# Patient Record
Sex: Female | Born: 1975 | Race: Black or African American | Hispanic: No | Marital: Single | Smoking: Current every day smoker
Health system: Southern US, Community
[De-identification: ages and names within clinical notes are randomized; demographics above are authoritative.]

## PROBLEM LIST (undated history)

## (undated) DIAGNOSIS — D649 Anemia, unspecified: Secondary | ICD-10-CM

## (undated) DIAGNOSIS — R011 Cardiac murmur, unspecified: Secondary | ICD-10-CM

## (undated) DIAGNOSIS — M1990 Unspecified osteoarthritis, unspecified site: Secondary | ICD-10-CM

## (undated) DIAGNOSIS — A539 Syphilis, unspecified: Secondary | ICD-10-CM

## (undated) DIAGNOSIS — I10 Essential (primary) hypertension: Secondary | ICD-10-CM

## (undated) DIAGNOSIS — A599 Trichomoniasis, unspecified: Secondary | ICD-10-CM

## (undated) DIAGNOSIS — K219 Gastro-esophageal reflux disease without esophagitis: Secondary | ICD-10-CM

## (undated) DIAGNOSIS — R002 Palpitations: Secondary | ICD-10-CM

## (undated) DIAGNOSIS — D219 Benign neoplasm of connective and other soft tissue, unspecified: Secondary | ICD-10-CM

## (undated) HISTORY — DX: Palpitations: R00.2

## (undated) HISTORY — PX: EYE SURGERY: SHX253

## (undated) HISTORY — PX: ABDOMINAL HYSTERECTOMY: SHX81

## (undated) HISTORY — PX: HAND SURGERY: SHX662

## (undated) HISTORY — DX: Essential (primary) hypertension: I10

## (undated) HISTORY — PX: GALLBLADDER SURGERY: SHX652

## (undated) HISTORY — PX: CHOLECYSTECTOMY: SHX55

---

## 1998-03-05 ENCOUNTER — Other Ambulatory Visit: Admission: RE | Admit: 1998-03-05 | Discharge: 1998-03-05 | Payer: Self-pay | Admitting: Obstetrics

## 1998-09-21 ENCOUNTER — Emergency Department (HOSPITAL_COMMUNITY): Admission: EM | Admit: 1998-09-21 | Discharge: 1998-09-21 | Payer: Self-pay | Admitting: Emergency Medicine

## 1999-04-20 ENCOUNTER — Other Ambulatory Visit: Admission: RE | Admit: 1999-04-20 | Discharge: 1999-04-20 | Payer: Self-pay | Admitting: Obstetrics

## 2000-04-18 ENCOUNTER — Other Ambulatory Visit: Admission: RE | Admit: 2000-04-18 | Discharge: 2000-04-18 | Payer: Self-pay | Admitting: Obstetrics

## 2000-07-06 ENCOUNTER — Other Ambulatory Visit: Admission: RE | Admit: 2000-07-06 | Discharge: 2000-07-06 | Payer: Self-pay | Admitting: Obstetrics

## 2000-07-06 ENCOUNTER — Encounter (INDEPENDENT_AMBULATORY_CARE_PROVIDER_SITE_OTHER): Payer: Self-pay

## 2000-09-09 ENCOUNTER — Emergency Department (HOSPITAL_COMMUNITY): Admission: EM | Admit: 2000-09-09 | Discharge: 2000-09-09 | Payer: Self-pay | Admitting: Emergency Medicine

## 2001-07-12 ENCOUNTER — Encounter (INDEPENDENT_AMBULATORY_CARE_PROVIDER_SITE_OTHER): Payer: Self-pay

## 2001-07-12 ENCOUNTER — Ambulatory Visit (HOSPITAL_COMMUNITY): Admission: RE | Admit: 2001-07-12 | Discharge: 2001-07-12 | Payer: Self-pay | Admitting: Obstetrics

## 2002-08-16 ENCOUNTER — Encounter (HOSPITAL_BASED_OUTPATIENT_CLINIC_OR_DEPARTMENT_OTHER): Payer: Self-pay | Admitting: General Surgery

## 2002-08-16 ENCOUNTER — Inpatient Hospital Stay (HOSPITAL_COMMUNITY): Admission: AD | Admit: 2002-08-16 | Discharge: 2002-08-19 | Payer: Self-pay | Admitting: General Surgery

## 2002-08-16 ENCOUNTER — Encounter (INDEPENDENT_AMBULATORY_CARE_PROVIDER_SITE_OTHER): Payer: Self-pay | Admitting: *Deleted

## 2002-08-17 ENCOUNTER — Encounter (HOSPITAL_BASED_OUTPATIENT_CLINIC_OR_DEPARTMENT_OTHER): Payer: Self-pay | Admitting: General Surgery

## 2003-01-01 ENCOUNTER — Emergency Department (HOSPITAL_COMMUNITY): Admission: AD | Admit: 2003-01-01 | Discharge: 2003-01-01 | Payer: Self-pay | Admitting: Family Medicine

## 2003-07-30 ENCOUNTER — Emergency Department (HOSPITAL_COMMUNITY): Admission: EM | Admit: 2003-07-30 | Discharge: 2003-07-30 | Payer: Self-pay | Admitting: Family Medicine

## 2003-12-17 ENCOUNTER — Inpatient Hospital Stay (HOSPITAL_COMMUNITY): Admission: AD | Admit: 2003-12-17 | Discharge: 2003-12-17 | Payer: Self-pay | Admitting: *Deleted

## 2004-01-20 ENCOUNTER — Inpatient Hospital Stay (HOSPITAL_COMMUNITY): Admission: AD | Admit: 2004-01-20 | Discharge: 2004-01-20 | Payer: Self-pay | Admitting: Obstetrics and Gynecology

## 2004-01-20 ENCOUNTER — Other Ambulatory Visit: Admission: RE | Admit: 2004-01-20 | Discharge: 2004-01-20 | Payer: Self-pay | Admitting: Obstetrics and Gynecology

## 2004-01-27 ENCOUNTER — Encounter (HOSPITAL_COMMUNITY): Admission: RE | Admit: 2004-01-27 | Discharge: 2004-02-26 | Payer: Self-pay | Admitting: Obstetrics and Gynecology

## 2004-03-17 ENCOUNTER — Encounter: Admission: RE | Admit: 2004-03-17 | Discharge: 2004-03-17 | Payer: Self-pay | Admitting: Nephrology

## 2004-08-18 ENCOUNTER — Inpatient Hospital Stay (HOSPITAL_COMMUNITY): Admission: AD | Admit: 2004-08-18 | Discharge: 2004-08-18 | Payer: Self-pay | Admitting: Obstetrics and Gynecology

## 2004-08-24 ENCOUNTER — Inpatient Hospital Stay (HOSPITAL_COMMUNITY): Admission: AD | Admit: 2004-08-24 | Discharge: 2004-08-26 | Payer: Self-pay | Admitting: Obstetrics and Gynecology

## 2004-09-01 ENCOUNTER — Inpatient Hospital Stay (HOSPITAL_COMMUNITY): Admission: EM | Admit: 2004-09-01 | Discharge: 2004-09-03 | Payer: Self-pay | Admitting: Emergency Medicine

## 2004-09-06 ENCOUNTER — Inpatient Hospital Stay (HOSPITAL_COMMUNITY): Admission: AD | Admit: 2004-09-06 | Discharge: 2004-09-12 | Payer: Self-pay | Admitting: Obstetrics and Gynecology

## 2004-10-09 ENCOUNTER — Inpatient Hospital Stay (HOSPITAL_COMMUNITY): Admission: AD | Admit: 2004-10-09 | Discharge: 2004-10-09 | Payer: Self-pay | Admitting: Obstetrics and Gynecology

## 2005-01-27 ENCOUNTER — Ambulatory Visit (HOSPITAL_COMMUNITY): Admission: RE | Admit: 2005-01-27 | Discharge: 2005-01-27 | Payer: Self-pay | Admitting: Family Medicine

## 2005-01-27 ENCOUNTER — Emergency Department (HOSPITAL_COMMUNITY): Admission: EM | Admit: 2005-01-27 | Discharge: 2005-01-27 | Payer: Self-pay | Admitting: Family Medicine

## 2005-12-14 ENCOUNTER — Ambulatory Visit (HOSPITAL_COMMUNITY): Admission: RE | Admit: 2005-12-14 | Discharge: 2005-12-15 | Payer: Self-pay | Admitting: Surgery

## 2006-01-19 ENCOUNTER — Encounter: Admission: RE | Admit: 2006-01-19 | Discharge: 2006-01-19 | Payer: Self-pay | Admitting: Surgery

## 2007-08-05 ENCOUNTER — Emergency Department (HOSPITAL_COMMUNITY): Admission: EM | Admit: 2007-08-05 | Discharge: 2007-08-06 | Payer: Self-pay | Admitting: Emergency Medicine

## 2007-08-08 ENCOUNTER — Emergency Department (HOSPITAL_COMMUNITY): Admission: EM | Admit: 2007-08-08 | Discharge: 2007-08-09 | Payer: Self-pay | Admitting: Emergency Medicine

## 2007-11-04 ENCOUNTER — Inpatient Hospital Stay (HOSPITAL_COMMUNITY): Admission: AD | Admit: 2007-11-04 | Discharge: 2007-11-04 | Payer: Self-pay | Admitting: Gynecology

## 2007-12-16 ENCOUNTER — Inpatient Hospital Stay (HOSPITAL_COMMUNITY): Admission: AD | Admit: 2007-12-16 | Discharge: 2007-12-16 | Payer: Self-pay | Admitting: Obstetrics & Gynecology

## 2008-01-27 ENCOUNTER — Emergency Department (HOSPITAL_COMMUNITY): Admission: EM | Admit: 2008-01-27 | Discharge: 2008-01-27 | Payer: Self-pay | Admitting: Emergency Medicine

## 2008-03-17 ENCOUNTER — Inpatient Hospital Stay (HOSPITAL_COMMUNITY): Admission: AD | Admit: 2008-03-17 | Discharge: 2008-03-17 | Payer: Self-pay | Admitting: Obstetrics & Gynecology

## 2008-08-01 ENCOUNTER — Inpatient Hospital Stay (HOSPITAL_COMMUNITY): Admission: AD | Admit: 2008-08-01 | Discharge: 2008-08-02 | Payer: Self-pay | Admitting: Obstetrics & Gynecology

## 2008-11-26 ENCOUNTER — Emergency Department (HOSPITAL_COMMUNITY): Admission: EM | Admit: 2008-11-26 | Discharge: 2008-11-26 | Payer: Self-pay | Admitting: Emergency Medicine

## 2009-04-02 ENCOUNTER — Inpatient Hospital Stay (HOSPITAL_COMMUNITY): Admission: AD | Admit: 2009-04-02 | Discharge: 2009-04-02 | Payer: Self-pay | Admitting: Obstetrics and Gynecology

## 2009-04-29 ENCOUNTER — Inpatient Hospital Stay (HOSPITAL_COMMUNITY): Admission: AD | Admit: 2009-04-29 | Discharge: 2009-04-29 | Payer: Self-pay | Admitting: Obstetrics & Gynecology

## 2009-10-06 ENCOUNTER — Ambulatory Visit: Payer: Self-pay | Admitting: Nurse Practitioner

## 2009-10-06 ENCOUNTER — Inpatient Hospital Stay (HOSPITAL_COMMUNITY): Admission: AD | Admit: 2009-10-06 | Discharge: 2009-10-06 | Payer: Self-pay | Admitting: Obstetrics

## 2009-12-03 ENCOUNTER — Emergency Department (HOSPITAL_COMMUNITY): Admission: EM | Admit: 2009-12-03 | Discharge: 2009-12-03 | Payer: Self-pay | Admitting: Emergency Medicine

## 2010-03-04 ENCOUNTER — Emergency Department (HOSPITAL_COMMUNITY): Admission: EM | Admit: 2010-03-04 | Discharge: 2009-04-08 | Payer: Self-pay | Admitting: Emergency Medicine

## 2010-03-28 DIAGNOSIS — D219 Benign neoplasm of connective and other soft tissue, unspecified: Secondary | ICD-10-CM

## 2010-03-28 HISTORY — DX: Benign neoplasm of connective and other soft tissue, unspecified: D21.9

## 2010-05-21 ENCOUNTER — Emergency Department (HOSPITAL_COMMUNITY)
Admission: EM | Admit: 2010-05-21 | Discharge: 2010-05-21 | Disposition: A | Discharge status: Home / Self Care | Payer: BC Managed Care – PPO | Attending: Emergency Medicine | Admitting: Emergency Medicine

## 2010-05-21 DIAGNOSIS — I10 Essential (primary) hypertension: Secondary | ICD-10-CM | POA: Insufficient documentation

## 2010-05-21 DIAGNOSIS — M5430 Sciatica, unspecified side: Secondary | ICD-10-CM | POA: Insufficient documentation

## 2010-05-21 DIAGNOSIS — M545 Low back pain, unspecified: Secondary | ICD-10-CM | POA: Insufficient documentation

## 2010-06-12 LAB — URINALYSIS, ROUTINE W REFLEX MICROSCOPIC
Bilirubin Urine: NEGATIVE
Glucose, UA: NEGATIVE mg/dL
Ketones, ur: NEGATIVE mg/dL
Leukocytes, UA: NEGATIVE
Nitrite: NEGATIVE
Protein, ur: NEGATIVE mg/dL
Specific Gravity, Urine: 1.025 (ref 1.005–1.030)
Urobilinogen, UA: 0.2 mg/dL (ref 0.0–1.0)
pH: 5.5 (ref 5.0–8.0)

## 2010-06-12 LAB — CBC
HCT: 36.6 % (ref 36.0–46.0)
Hemoglobin: 12.3 g/dL (ref 12.0–15.0)
MCHC: 33.6 g/dL (ref 30.0–36.0)
MCV: 93.9 fL (ref 78.0–100.0)
Platelets: 276 10*3/uL (ref 150–400)
RBC: 3.9 MIL/uL (ref 3.87–5.11)
RDW: 12.5 % (ref 11.5–15.5)
WBC: 8.4 10*3/uL (ref 4.0–10.5)

## 2010-06-12 LAB — URINE MICROSCOPIC-ADD ON: WBC, UA: NONE SEEN WBC/hpf (ref <3)

## 2010-06-12 LAB — POCT PREGNANCY, URINE: Preg Test, Ur: NEGATIVE

## 2010-06-13 LAB — URINALYSIS, ROUTINE W REFLEX MICROSCOPIC
Ketones, ur: NEGATIVE mg/dL
Nitrite: NEGATIVE
Specific Gravity, Urine: 1.01 (ref 1.005–1.030)
Urobilinogen, UA: 0.2 mg/dL (ref 0.0–1.0)
pH: 6.5 (ref 5.0–8.0)

## 2010-06-13 LAB — WET PREP, GENITAL: Trich, Wet Prep: NONE SEEN

## 2010-06-13 LAB — URINE MICROSCOPIC-ADD ON

## 2010-06-13 LAB — GC/CHLAMYDIA PROBE AMP, GENITAL: Chlamydia, DNA Probe: NEGATIVE

## 2010-06-16 LAB — CBC
HCT: 35.6 % — ABNORMAL LOW (ref 36.0–46.0)
Hemoglobin: 12 g/dL (ref 12.0–15.0)
MCV: 93 fL (ref 78.0–100.0)
RBC: 3.83 MIL/uL — ABNORMAL LOW (ref 3.87–5.11)
WBC: 8.3 10*3/uL (ref 4.0–10.5)

## 2010-06-16 LAB — URINALYSIS, ROUTINE W REFLEX MICROSCOPIC
Bilirubin Urine: NEGATIVE
Ketones, ur: NEGATIVE mg/dL
Nitrite: NEGATIVE
Protein, ur: NEGATIVE mg/dL
Specific Gravity, Urine: 1.01 (ref 1.005–1.030)
Urobilinogen, UA: 0.2 mg/dL (ref 0.0–1.0)

## 2010-06-16 LAB — URINE CULTURE: Colony Count: 10000

## 2010-06-16 LAB — URINE MICROSCOPIC-ADD ON

## 2010-06-16 LAB — POCT PREGNANCY, URINE: Preg Test, Ur: NEGATIVE

## 2010-07-06 LAB — GC/CHLAMYDIA PROBE AMP, GENITAL
Chlamydia, DNA Probe: NEGATIVE
GC Probe Amp, Genital: NEGATIVE

## 2010-07-06 LAB — HERPES SIMPLEX VIRUS CULTURE: Culture: DETECTED

## 2010-08-13 NOTE — Discharge Summary (Signed)
NAMESHENICKA, SUNDERLIN              ACCOUNT NO.:  [id number]   MEDICAL RECORD NO.:  [id number]          PATIENT TYPE:  INP   LOCATION:  9372                          FACILITY:  [facility]   PHYSICIAN:  Naima A. Dillard, M.D. DATE OF BIRTH:  02-01-1976   DATE OF ADMISSION:  09/06/2004  DATE OF DISCHARGE:  09/12/2004                                 DISCHARGE SUMMARY   ADMITTING DIAGNOSIS:  13 days postpartum from spontaneous vaginal delivery,  severe preeclampsia, history of subarachnoid hemorrhage noted on September 01, 2004 and September 02, 2004 with CT scan.   DISCHARGE DIAGNOSIS:  19 days status post spontaneous vaginal delivery,  severe preeclampsia, labile blood pressure, resolving subarachnoid  hemorrhage.   HISTORY OF PRESENT ILLNESS:  Ms. Ouellet is a 35 year old gravida 2, para 2  who presented 13 days status post spontaneous vaginal delivery. She  presented following elevation of blood pressure at home by evaluation of the  [employer] nurse of 160/100. She was then seen at the office of [facility] that  day with extremely elevated blood pressures of 180-190 over 100. She had  3.0+ protein on the voided catheter specimen and on 24-hour urine completed  September 07, 2004, total urine protein for 24 hours was noted to be 2196 grams.   HOSPITAL COURSE:  The patient was admitted to [facility]. Magnesium sulfate 4 grams loading dose and 2 grams per hour was  begun. The patient continued neurology follow-up which was begun at [facility] during admission June 7 and June 8. The patient also is to be  followed by nephrology. Her blood pressures remained labile throughout her  admission. Magnesium sulfate was utilized for 48 hours. The patient did not  have good diuresis and was begun on a diuretic as well as blood pressure  medicine. Her blood pressures yesterday on June 17 were 150's over 90's to  100's. Her Procardia was increased at that time and today her 19th  postpartum  day, her blood pressures remained stable 120's-130's over 70's to  90's. The patient is now taking Procardia XL 60 milligrams, K-Dur 40 mEq,  and HCTZ 50 mg twice a day. She was alert and oriented. Her lungs are clear.  Heart regular rate and rhythm. Her edema has significantly decreased. She is  no longer having any headache, visual changes or epigastric pain. In light  of the fact that her vital signs remained stable, her blood pressure has  normalized and she has no further headache symptoms, she is to be discharged  today in stable condition. She will follow up with neurology and nephrology  this week and also at the office of [facility]. She will call for any PIH  symptoms, any headache, visual changes or epigastric pain or any further  problems or concerns.       SDM/MEDQ  D:  09/12/2004  T:  09/12/2004  Job:  [id number]

## 2010-08-13 NOTE — Op Note (Signed)
[name], [name]                        ACCOUNT NO.:  [id number]   MEDICAL RECORD NO.:  [id number]                   PATIENT TYPE:  INP   LOCATION:  5703                                 FACILITY:  [facility]   PHYSICIAN:  Leonie Man, M.D.                DATE OF BIRTH:  19-May-1975   DATE OF PROCEDURE:  08/17/2002  DATE OF DISCHARGE:                                 OPERATIVE REPORT   PREOPERATIVE DIAGNOSIS:  Acute cholecystitis.   POSTOPERATIVE DIAGNOSIS:  Acute cholecystitis.   PROCEDURE:  Laparoscopic cholecystectomy.   SURGEON:  Leonie Man, M.D.   ASSISTANT:  Sharlet Salina T. Hoxworth, M.D.   ANESTHESIA:  General.   FINDINGS:  This patient is a 35 year old female with onset of right upper  quadrant and epigastric pain penetrating to the back and associated with  nausea.  She was evaluated and noted to have cholelithiasis on ultrasound.  The patient was then subsequently admitted to the hospital, started on  antibiotics and hydration, and scheduled for surgery.  Surgical findings  show a dual gallbladder with a dual cystic ducts effusing into a single duct  at the common duct, cystic duct, junction.  She had multiple stones in both  gallbladders.   DESCRIPTION OF PROCEDURE:  Following the induction of satisfactory general  anesthesia with the patient positioned supinely, the abdomen was routinely  prepped and draped to be included in a sterile operative field.  Open  laparoscopy was created through a supraumbilical incision and for the  insertion of a Hasson cannula, insufflation of the peritoneal cavity to 14  to 15 mmHg pressure using carbon dioxide.  The camera was inserted and  visual exploration of the abdomen showed the findings as noted above; a dual  gallbladder ending in a fused cystic duct. The liver edges were sharp and  liver surfaces were smooth. The anterior gastric wall and duodenal slip  appeared to be normal.  None of the small or large intestines  viewed  appeared to be abnormal.  Her pelvic organs were not visualized.   Under direct vision, epigastric, and lateral ports were placed.  The  gallbladders were grasped and retracted cephalad with dissection carried  down in the region of the ampulla of the larger of the two gallbladders.  Dissection was carried around the cystic duct which appeared to be normal in  size. The cystic artery all of which lead up to the gallbladder walls,  appeared to be dual strands of arterial vessels.  These were doubly clipped  and transected.  The cystic duct was clipped proximally and opened.  Upon  opening, two lumens were noted.  The cystic duct catheter was passed into  the abdomen through a 14 gauge Angiocath and the catheter was placed in the  cystic duct and secured. A cystic duct cholangiogram was carried out showing  free flow of contrast into the duodenum with normal tapering of the  distal  common bile duct.  No filling defects were noted within the extrahepatic  biliary system. The cystic duct catheter was removed and the cystic duct was  doubly clipped and then transected.  Upon transecting, dual lumens were  noted and identified.  On both the distal and proximal ends.  The distal end  was triply clipped to assure closure. Both gallbladders were fused with a  septum and they were both dissected free from the liver bed. There were two  separate gallbladder fossae with the septum of the gallbladder fossae being  taken as the dissection proceeded toward the fundus of the gallbladder.  The  gallbladders were removed from the liver edge as a single specimen.  Hemostasis was assured with electrocautery. The right upper quadrant was  thoroughly irrigated with normal saline. The gallbladders were placed in a  pouch and retrieved through the umbilicus.  All areas of dissection were  again checked for hemostasis and noted to be dry. There was no evidence of  bile leak. A 19 [place] Blake drain was  passed into the abdomen and placed  under the liver for drainage.  The trocars were then removed under direct  vision.  Needle, sponge, and instrument count correct.  The abdominal wounds  closed in layers as follows. The umbilical wound in two layers with 0 Vicryl  and 4-0 Monocryl. Epigastric wound and the flank wound closed with 4-0  Monocryl. The drain which was passed through one of the port sites was  secured to the skin with a 3-0 nylon suture.  The sterile dressings were  then placed on the wounds and the anesthetic reversed. The patient was  removed from the operating room to the recovery room in stable condition.  She tolerated the procedure well.                                               Leonie Man, M.D.    PB/MEDQ  D:  08/17/2002  T:  08/17/2002  Job:  [id number]

## 2010-08-13 NOTE — Op Note (Signed)
[facility]  Patient:    [name], TRIMBLE Visit Number: [id number] MRN: [id number]          Service Type: DSU Location: [facility] Attending Physician:  Venita Sheffield Dictated by:   Kathreen Cosier, M.D. Proc. Date: 07/12/01 Admit Date:  07/12/2001                             Operative Report  ANESTHESIA:                   General.  PROCEDURE:                    Conization of the cervix.  DIAGNOSES:                    1. Severe dysplasia.                               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  DESCRIPTION OF PROCEDURE:     Under general anesthesia the patient in the lithotomy position, the perineum and vagina were prepped and draped.  The bladder emptied through the straight catheter.  Bimanual exam revealed uterus to be normal size.  Weighted speculum then placed in the vagina.  The cervix was grasped with an Allis clamp at 3 oclock, and high upon the lateral aspect of the cervix at 3 oclock a hemostatic suture was placed with #1 chromic. This was then repeated at 9 oclock (so sutures were at 3 and 9 oclock).  The cervix was stained with _____________ solution.  Colonized cone was done in the usual manner.  Hemostasis was achieved with U sutures into the cervix at 12 and 6 oclock.  The endometrial cavity was sounded, the cervix was patent. Hemostasis satisfactory.  The patient taken to recovery room in good condition. Dictated by:   Kathreen Cosier, M.D. Attending Physician:  Venita Sheffield DD:  07/12/01 TD:  07/13/01 Job: [id number]

## 2010-08-13 NOTE — Op Note (Signed)
[name], [name]              ACCOUNT NO.:  [id number]   MEDICAL RECORD NO.:  [id number]          PATIENT TYPE:  AMB   LOCATION:  DAY                          FACILITY:  [facility]   PHYSICIAN:  Thomas A. Cornett, M.D.DATE OF BIRTH:  09/08/1975   DATE OF PROCEDURE:  12/14/2005  DATE OF DISCHARGE:                                 OPERATIVE REPORT   PREOPERATIVE DIAGNOSIS:  Ventral hernia.   POSTOPERATIVE DIAGNOSIS:  4 cm x 4 cm ventral hernia.   PROCEDURE:  Laparoscopic ventral hernia repair with mesh.   SURGEON:  Maisie Fus A. Cornett, M.D.   ASSISTANT:  None.   ANESTHESIA:  General endotracheal anesthesia with 0.25% Sensorcaine local.   ESTIMATED BLOOD LOSS:  10 cc.   DRAINS:  None.   INDICATIONS FOR PROCEDURE:  The patient 35 year old female has had about a 2  to 3-cm periumbilical hernia.  I saw her a few months ago, and she comes in  today to have this repaired electively.  The hernia has been getting larger  and causing her discomfort.  I discussed the procedure with her as well as  potential complications.  She understood and agreed to proceed at this point  in time.  The risks of bleeding, infection, bowel injury, hernia recurrence,  and bowel obstruction were all discussed preoperatively with the patient,  and she voiced her understanding.  This was done in the holding room as well  as in the office.   DESCRIPTION OF PROCEDURE:  The patient was brought to the operating room and  placed supine.  After induction of general anesthesia, a Foley catheter and  nasogastric tubes were placed.  The abdomen was then prepped and draped in  sterile fashion.  Ioban adhesive drape was used as well.   A 10-mm Optiview was used, and a small incision was made in the patient's  left upper quadrant.  We used the Optiview under direct vision to enter the  abdominal cavity without difficulty.  Pneumoperitoneum was then established,  and the laparoscope was placed.  I examined the colon and  small bowel in the  region where the Optiview was placed and saw no evidence of injury.  Two 5-  mm ports were then placed, one in the patient's right lower quadrant and the  second in the left upper quadrant under direct vision.  On inspection of the  abdominal wall just above the umbilicus was what appeared to be a 4 x 4-cm  hernia defect.  I measured this using one of the instruments with its jaws  wide open, and this appeared to be very accurate.  The falciform ligament  was taken down away from this to further expose the abdominal wall with the  harmonic scalpel without difficulty.  After measuring the defect, we then  measured from the outside using a spinal needle circumferentially and marked  this.  We went for about 4 cm of overlap, so we used a piece of Parietex and  cut it in a circular fashion with diameters of roughly 9  x 8 cm for  adequate overlap.  Four sutures of #  1 Novofil were placed in the Parietex.  It was then placed in saline and then introduced through the 10-mm port into  the abdominal cavity.  It was laid out flat in the abdominal cavity.  The  suture passer was used, and all four sutures were grabbed at those four  quadrants, lifted and pulled up flush with the fascia.  This was then tied  down.  Once these sutures were tied down, the mesh had reasonable tension  without too much tension.  A tacking device was used to tack the mesh  circumferentially so it would lay flush with the abdominal wall with the  smooth side down.  There was very little pleating of the mesh, but this  occurred inferiorly where the inferior suture was.  I was able to use a  tacker to smooth this out and help the mesh lay flat to cover the defect  adequately.  At this point in time, I inspected the bowel a second time  after releasing the CO2 and reinflating the abdominal cavity and again saw  no evidence of bowel injury or solid organ injury.  Hemostasis was  excellent.  At this point in  time, I removed 11-mm port and closed this with  a suture passer and a  0 Vicryl.  The CO2 was subsequently released slowly,  and the mesh laid very nicely.  Unfortunately, she had very little omentum  to lay below this, but this seemed to lay quite nicely.  At this point in  time, I removed the other three ports without difficulty and saw no signs of  port site bleeding.  All skin incisions including the stab wounds for the  transabdominal sutures were closed with 4-0 Monocryl.  Steri-Strips and dry  dressings were applied.  All final counts were correct.   The patient was awakened and taken to recovery in satisfactory condition.      Thomas A. Cornett, M.D.  Electronically Signed     TAC/MEDQ  D:  12/14/2005  T:  12/15/2005  Job:  [id number]   cc:   Dr. Christell Constant  __________ [facility]

## 2010-08-13 NOTE — H&P (Signed)
NAMEAMBERROSE, FRIEBEL              ACCOUNT NO.:  [id number]   MEDICAL RECORD NO.:  [id number]          PATIENT TYPE:  INP   LOCATION:  9171                          FACILITY:  [facility]   PHYSICIAN:  Janine Limbo, M.D.DATE OF BIRTH:  July 05, 1975   DATE OF ADMISSION:  08/24/2004  DATE OF DISCHARGE:                                HISTORY & PHYSICAL   HISTORY OF PRESENT ILLNESS:  Ms. Lasure is a 35 year old gravida 2, para 1-0-  0-1 at 41-4/7 weeks who presented without calling with uterine contractions  every five minutes at 3:30 a.m.  She denies leaking or bleeding.  She  reports positive fetal movement.  The pregnancy has been remarkable for:  1.  History of post partum depression.  2.  History of positive Syphilis, which  was treated in October.  3.  Smoker.  4.  Rubella negative.  5.  Desires  tubal sterilization.  The papers were signed April 29, 2004.  6.  History  of sporadic proteinuria.  She had a normal workup at the nephrologist.   PRENATAL LABORATORIES:  Blood type is A positive.  Rh antibody negative.  VDRL positive in September with a titer of 16.  In September after treatment  her titer was down to 1.8.  Rubella titer is nonimmune.  Hepatitis B surface  antigen negative.  RPR was 1:2 at 27 weeks.  Pap was normal.  GC and  Chlamydia cultures were negative in October and at 36 weeks.  Hemoglobin at  our practice was 12.8.  It was 9.9. at 26 weeks.  EDC of Aug 13, 2004 was  established by the last menstrual period and was in agreement with the  ultrasound at approximately 5 and 18 weeks.   HISTORY OF PRESENT PREGNANCY:  The patient entered care at approximately 10  weeks.  She had had a positive Syphilis titer on December 21, 2003.  She  was treated subsequently once she began her care.  She then had a repeat  original titer in October that was 1:16.  Her followup titer in September  was 1:8 and then was 1:4 on December 31, 2004.  She had some proteinuria at 13  weeks.  A  urine culture was negative.  A 24 hour urine was done secondary to  persistent proteinuria.  The patient was referred to the nephrologist.  Her  24 hour urine protein was 175.  Creatinine was elevated at 4.2, creatinine  clearance was decreased at 14.  She had another ultrasound at 19 weeks for  normal growth and development.  Her nephrology appointment showed no  significant disease.  The patient also signed tubal papers on May 12, 2004.  Her RPR titer at 26 weeks was 1:2.  A consult with the health  department revealed no further treatment was needed.  She was diagnosed with  gastroesophageal reflux disease.  After approximately 34 weeks the patient  had no further proteinuria.  Blood pressures were stable.  A group B  Streptococcus culture was negative.  GC and Chlamydia cultures were  negative.   OBSTETRICAL HISTORY:  The patient  in January 1998 had a vaginal birth of  female infant, weight 6 pounds 10 ounces, at 40 weeks.  She was in labor 12  hours.  She had no anesthesia.  The baby did have some meconium and had a  cord around the neck.  She did have some post partum depression with her  first pregnancy but had no treatment.   MEDICAL HISTORY:  1. She had an abnormal Pap smear in 2003 and had a colposcopy.  2. She was diagnosed with Syphilis in October and then was treated      subsequently.  3. She reports the usual childhood illnesses.  4. She had a bladder infection years ago.  5. She is a smoker.  6. She broke her thumb as a child.  7. She had hernia surgery 10 years ago.  8. Her gallbladder was removed at 35 years old.  9. Her only other hospitalization was for childbirth.     FAMILY HISTORY:  Her maternal grandfather was insulin-dependent diabetic now  deceased.  A maternal aunt was on oral medication.  Her maternal grandfather  had dialysis.  Genetic history is remarkable for the father of baby having  twins running in the family.   SOCIAL HISTORY:  The patient  is Tree surgeon.  She is single. The father  of the baby is deceased from a robbery early in the patient's pregnancy.  The patient has a high school education.  She is employed as Advertising copywriter.  Her mother is her support person.  She denies any alcohol or drug use during  this pregnancy.  She has been a smoker.   ALLERGIES:  THE PATIENT HAS NO KNOWN MEDICATION ALLERGIES.   PHYSICAL EXAMINATION:  VITAL SIGNS:  Stable.  The patient is afebrile.  HEENT:  Within normal limits.  LUNGS:  Breath sounds are clear.  HEART:  Regular rate and rhythm without murmur.  BREASTS:  Soft and nontender.  ABDOMEN:  Fundal height is approximately 38 cm.  Estimated fetal weight is 6-  7 pounds.  Electronic fetal monitor reveals reassuring and reactive fetal  heart rate tracing.  PELVIC:  Originally was 1 cm, 90% vertex, and a -1 station.  There appears  to be some scar tissue around the cervical os.  The patient ambulated one  hour. The cervix was then 1-2, 90%  vertex, and at a -1 to a 0 station with  a bulging bag of water.  EXTREMITIES:  Reflexes are 2+ without clonus.  There is a trace edema noted.   IMPRESSION:  1. Intrauterine pregnancy at 41-4/7 weeks.  2. Early labor.  3. History of positive Syphilis in October, which was treated.  4. Desires tubal sterilization with papers signed on May 12, 2004 and      on the chart.  5. Group B Streptococcus negative.     PLAN:  1. Admit to birthing suite for consult with Dr. Marline Backbone as the      attending physician.  2. Routine certified nurse midwife orders.  3. Planned pain medication p.r.n.        VLL/MEDQ  D:  08/24/2004  T:  08/24/2004  Job:  [id number]

## 2010-08-13 NOTE — Consult Note (Signed)
[name], [name]              ACCOUNT NO.:  [id number]   MEDICAL RECORD NO.:  [id number]          PATIENT TYPE:  EMS   LOCATION:  MINO                         FACILITY:  [facility]   PHYSICIAN:  Sanjeev K. Deveshwar, M.D.DATE OF BIRTH:  27-Jan-1976   DATE OF CONSULTATION:  09/01/2004  DATE OF DISCHARGE:                                   CONSULTATION   CHIEF COMPLAINT:  Headache.   HISTORY OF PRESENT ILLNESS:  This is a pleasant 35 year old African-American  female who presented to [facility] today for  evaluation of a headache which started yesterday.  An MRI was ordered.  I do  not have the detailed report, but apparently it was consistent with a small  subarachnoid hemorrhage.  Dr. Wynetta Emery asked Dr. Corliss Skains to perform a cerebral  angiogram to further evaluate these findings.  The angiogram will be  performed as soon as possible today.   PAST MEDICAL HISTORY:  The patient has been healthy.  She recently gave  birth to a son last Tuesday.  She has one other child at home.  She is  status post cholecystectomy approximately 3 years ago performed by Dr.  Lurene Shadow.  She denies a history of hypertension, however her blood pressure is  quite high in the emergency room today.   ALLERGIES:  NO KNOWN DRUG ALLERGIES.   CURRENT MEDICATIONS:  None.   REVIEW OF SYSTEMS:  Is completely negative except for the following.  The  patient has had a headache since yesterday.  She had reflux symptoms when  she was pregnant.  The remainder of the review of systems is negative.   SOCIAL HISTORY:  The patient is single, she lives in [place], she has 2  children.  She smokes 6 cigarettes per day and has done so for approximately  6-10 years.  She works in Stage manager at [employer].   FAMILY HISTORY:  Both parents are living, in their 68s, and have no  significant medical illnesses.   LABORATORY DATA:  The final MRI report is pending; however, it was  consistent with a  subarachnoid hemorrhage.  A prothrombin time INR is 0.8, a  PTT is 26, platelets are 387,000, hemoglobin 11.9, hematocrit 35, BUN 10,  creatinine 0.7, potassium is low at 3.3.   PHYSICAL EXAM:  Reveals a 35 year old African-American female who is tearful  at times with a headache.  VITAL SIGNS:  Blood pressure 191/94, pulse 53.  HEENT:  Is unremarkable.  NECK:  Reveals no bruits, no jugular venous distention.  HEART:  Reveals a regular rate and rhythm with a grade 2/6 systolic murmur.  LUNGS:  Clear.  EXTREMITIES:  Reveal pulses to be intact, there is no significant edema.  Skin is warm and dry.  NEUROLOGICAL EXAM:  Mental Status.  The patient is alert and oriented, she  follows commands, she answers questions appropriately.  Cranial nerves II-  XII are grossly intact.  Extraocular movements are intact.  Tongue is  midline.  There is no facial asymmetry noted.  Sensation is intact to light  touch.  Motor strength was 5/5 throughout.  Cerebellar testing is intact.   IMPRESSION:  1.  Headache since yesterday, with MRI today, preliminary report consistent      with small subarachnoid hemorrhage, final report pending.  2.  Hypertension.  3.  Recent childbirth.  4.  Status post cholecystectomy.  5.  History of tobacco use.  6.  Hypokalemia.   PLAN:  The patient will undergo a cerebral angiogram today to be performed  by Dr. Corliss Skains for further evaluation of the above-noted symptoms and her  abnormal MRI.      DR/MEDQ  D:  09/01/2004  T:  09/01/2004  Job:  [id number]   cc:   Donalee Citrin, M.D.  [address]  [place]  [place] [address]  Fax: [phone]   Nigel Bridgeman, M.D.  [place]

## 2010-08-13 NOTE — Discharge Summary (Signed)
   NAMEKEIARAH, [name]                        ACCOUNT NO.:  [id number]   MEDICAL RECORD NO.:  [id number]                   PATIENT TYPE:  INP   LOCATION:  5703                                 FACILITY:  [facility]   PHYSICIAN:  Leonie Man, M.D.                DATE OF BIRTH:  05-08-1975   DATE OF ADMISSION:  08/16/2002  DATE OF DISCHARGE:  08/19/2002                                 DISCHARGE SUMMARY   ADMISSION DIAGNOSIS:  Acute cholecystitis.   DISCHARGE DIAGNOSIS:  Acute cholecystitis.   PROCEDURES:  Laparoscopic cholecystectomy with interoperative cholangiogram  and drainage. No complications.   CONDITION ON DISCHARGE:  Improved.   HISTORY OF PRESENT ILLNESS:  [name] is a 35 year old woman presenting  with onset of right upper quadrant abdominal pain with severe  worsening  over the ensuing days with pain radiating to her back. She had no nausea or  emesis. She was seen by her gynecologist who on ultrasound noted  cholelithiasis.   HOSPITAL COURSE:  The patient was admitted to the hospital on Aug 16, 2002,  and started on antibiotics. 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 She underwent cholecystectomy of both  her gallbladders with a drain left in place.   Her postoperative course has been benign with normal resumption of diet and  activity. On Aug 19, 2002, she is being discharged to follow up in my office  in 2 weeks.   DISCHARGE MEDICATIONS:  Vicodin 1 to 2 tablets q.4h. p.r.n. pain.   DISCHARGE INSTRUCTIONS:  Activity as tolerated. Diet is unrestricted.                                               Leonie Man, M.D.    PB/MEDQ  D:  09/06/2002  T:  09/07/2002  Job:  [id number]

## 2010-08-13 NOTE — Consult Note (Signed)
NAMELARSEN, ZETTEL              ACCOUNT NO.:  [id number]   MEDICAL RECORD NO.:  [id number]          PATIENT TYPE:  EMS   LOCATION:  MINO                         FACILITY:  [facility]   PHYSICIAN:  Marlan Palau, M.D.  DATE OF BIRTH:  1976-02-22   DATE OF CONSULTATION:  09/01/2004  DATE OF DISCHARGE:                                   CONSULTATION   HISTORY OF PRESENT ILLNESS:  [name] is a 35 year old right-handed  black female born on 08-03-1975, with a history of recent delivery  about a week ago and history of syphilis that was treated in the fall of  2005 with reduction of titer from 1-16 to 1-2 prior to delivery. This  patient presents with headache that began 24 hours prior to this admission.  The patient does not report neck stiffness. Denies visual field change,  numbness or weakness in the arms or legs, nausea, vomiting, or gait  disturbance. The patient has not had confusion or blackout episodes.  The  patient underwent a CT scan of the head that showed subarachnoid blood  around the right parietal, occipital, and temporal lobes, and a venous  infarct is entertained. Neurosurgery (Dr. Donalee Citrin) and neurology was asked  to see this patient for further evaluation.   PAST MEDICAL HISTORY:  1.  New onset headache with subarachnoid blood, possible venous infarct as      above.  2.  Umbilical hernia repair in the past  3.  History of retinal detachment surgery in the past.  4.  Gallbladder surgery.  5.  History of syphilis that was treated.  6.  History of proteinuria, seen and evaluated by hematology in the past.  7.  History of postpartum depression. The patient had been taking some      Motrin prior to admission.   Smokes six cigarettes daily. Does not drink alcohol. Denies the use of other  drugs such as cocaine or marijuana. The patient has no known allergies.   SOCIAL HISTORY:  [place],  is single, has two  children, works as a Advertising copywriter. Children are alive and  well.   FAMILY MEDICAL HISTORY:  Notable that the patient has a maternal grandfather  with diabetes and maternal aunt with diabetes on oral medications. Maternal  grandfather has end-stage renal disease on hemodialysis. There is no family  history of clotting abnormalities or individuals with retinal detachments.   REVIEW OF SYSTEMS:  Notable for no recent fevers, chills. The patient does  note headache as above. Does not have any problems with shortness of breath,  chest pain, nausea, vomiting, trouble controlling the bowel or bladder, gait  disturbance, blackout episodes, or seizures.   PHYSICAL EXAMINATION:  VITAL SIGNS: Blood pressure currently 198/89, heart  rate 50, respiratory rate 18, temperature afebrile.  GENERAL: This patient is minimally obese black female who is alert and  cooperative at the time of examination.  HEENT: Head is atraumatic. Eyes reveal pupils equal, round, and reactive to  light. Disks are flat bilaterally.  NECK: Supple with no carotid bruits  noted.  RESPIRATORY:  Clear.  CARDIOVASCULAR: Regular rate and rhythm with no obvious murmurs or rubs  noted.  EXTREMITIES: Without significant edema.  ABDOMEN: Positive bowel sounds. No organomegaly or tenderness noted.  EXTREMITIES: Good strength in all four extremities.  NEUROLOGIC: Cranial nerves as above. Facial symmetry is present. The patient  has good sensation to facial pinprick, soft touch bilaterally. She has good  full extraocular movements. Visual fields are full to double simultaneous  stimulation and speech is well enunciated and nonaphasic. Good symmetrical  motor tone noted throughout. Sensory testing is intact to pinprick, soft  touch, and vibratory sensation throughout. The patient has good finger-nose-  finger and toe-to-finger bilaterally. No drift is seen. Deep tendon reflexes  are symmetric and normal. Toes are definitely downgoing  bilaterally.   CT of the head is as above. Bloodwork studies show sodium 143, potassium  3.3, chloride 110, CO2 24, glucose 97, BUN 10, creatinine 0.7, potassium  9.3. White count 11.5, hemoglobin 11.9, hematocrit 35.0, MCV 90.6, platelet  count 387,000. INR 0.8.   Chest x-ray and EKG are pending.   IMPRESSION:  1.  New onset headache with subarachnoid hemorrhage, right parieto-occipital      temporal lobes. Rule out venous thrombosis.  2.  History of syphilis, has been treated.  3.  History of retinal detachment in the past.   The patient is postpartum which puts her at high risk for venous size  thrombosis, but also has a history of retinal detachment and for this reason  do need to consider the possibility of homocystinuria. Also need to rule out  other hypercoagulable state such as factor 5 Leiden mutation. The patient  will require further evaluation of the above problem.   PLAN:  1.  MRI of the brain.  2.  MRV and MRA.  3.  Consider heparin therapy if a sinus thrombosis is noted.  4.  Could potentially consider a catheterization of venous sinus with      intersinus TPA.  5.  The patient's clinical condition at this point is not dire. Will      consider hypercoagulable state as above. Check urine drug screen.   I will follow the patient's clinical course while in-house.       CKW/MEDQ  D:  09/01/2004  T:  09/01/2004  Job:  [id number]   cc:   Donalee Citrin, M.D.  [address]  [place]  [place] [address]  Fax: [phone]

## 2010-08-13 NOTE — H&P (Signed)
[name], [name]              ACCOUNT NO.:  [id number]   MEDICAL RECORD NO.:  [id number]          PATIENT TYPE:  INP   LOCATION:  9372                          FACILITY:  [facility]   PHYSICIAN:  [name], M.D. DATE OF BIRTH:  October 28, 1975   DATE OF ADMISSION:  09/06/2004  DATE OF DISCHARGE:                                HISTORY & PHYSICAL   HISTORY OF PRESENT ILLNESS:  [name] is a 35 year old, gravida 2, para 2-  0-0-2, at 13 days status post spontaneous vaginal delivery, who presented  after evaluation by the [facility] nurse and was noted to have elevated  blood pressures in the 160-100 range.  She was seen at the office, and was  also noted to have blood pressures in the 140s-160s/100-110.  She also had  3+ proteinuria on a voided specimen.  At the time of her office visit, the  patient also reported a recent admission at [facility] ICU on September 01, 2004 to September 03, 2004 with severe headache, hypertension, and a diagnosis  of subarachnoid hemorrhage by CT.  The patient had had blood pressures of  170s/90s during that hospitalization.  She was treated with isolated doses  of Labetalol.  She was discharged on September 03, 2004 with a follow up  appointment made in 2 weeks with Dr. Wynetta Benson.  No medications were prescribed  for her home use.   While she was in the hospital, she did not have a urine sample examined, but  did have a CBC that showed within normal limits, and a PT PTT within normal  limits.  The patient reports her headache was improved over the last 1-2  days.  She denied visual symptoms or epigastric pain.  She had an angiogram  on September 01, 2004 with no significant findings.   History had been remarkable for -  1.  History of sporadic proteinuria in early pregnancy.  She had a normal 24-      hour urine protein in December with a creatinine of 4.2.  She was      referred to the nephrologist at that time.  Follow up examinations were      normal, and there was  some question as to the accuracy of these results.      Proteinuria resolved until the last 2 prenatal visits at 39 and 40      weeks.  She had no history of hypertension in pregnancy, labor, or      postpartum.  2.  History of syphilis in the first trimester, which was treated.  Her last      titer was 1:4 in the hospital.  3.  History of a detached retina with surgery in the past.  4.  Smoker.   PAST MEDICAL HISTORY:  1.  Subarachnoid bleed noted on September 01, 2004.  2.  Umbilical hernia repair in the past.  3.  History of retinal detachment surgery in the past.  4.  Gallbladder surgery.  5.  History of syphilis.  6.  History of proteinuria, which was seen and evaluated by hematology  in      the past.  7.  History of postpartum depression, but was having no issues this      pregnancy.  8.  She had an abnormal Pap smear in the past and a colposcopy.  9.  She reports the usual childhood illnesses.  10. She had a bladder infection years ago.  11. She is a smoker.  12. She broke her thumb as a child.   OBSTETRICAL HISTORY:  1.  In 1998, she had a vaginal birth of a female infant, weight 6 pounds, 10      ounces, at 40 weeks.  She was in labor 12 hours.  She did have meconium      staining and had a cord around the neck.  There was some postpartum      depression, but that required no treatment.  2.  She had another vaginal delivery on Aug 24, 2004, attended by [name], certified nurse midwife.  She had no complications during that      labor and birth.  She had a viable female.  She is bottle feeding at      present.   FAMILY HISTORY:  Her maternal grandfather was an insulin-dependent diabetic,  now deceased.  Her maternal aunt was on oral medication for diabetes.  Her  maternal grandfather was on dialysis.   SOCIAL HISTORY:  The patient is African-American.  She is single.  The  father of the baby is deceased from a robbery early in the patient's  pregnancy.  The patient  has a high school education.  She is employed as a  Advertising copywriter at [employer].  Her mother is her support person.  She denies  any alcohol or drug use during this pregnancy.  She is a smoker.   ALLERGIES:  The patient has no known medication allergies.   PHYSICAL EXAMINATION:  VITAL SIGNS:  Blood pressures are in the 180-201/99-  112 range.  Other vital signs are stable.  O2 saturation was 99% on room  air.  Weight was 152 at the office today.  CHEST:  Clear.  ABDOMEN:  Soft and nontender with a well involuted uterus.  Fundus was firm.  Lochia was scant.  EXTREMITIES:  Deep tendon reflexes were 1+ without clonus.  There was a  trace edema noted.   LABORATORY DATA:  CBC showed a hemoglobin of 9.9, hemoglobin of 29.2, white  blood cell count of 8.9, and platelet count of 192.  Comprehensive metabolic  panel showed sodium of 147, potassium 3.6, chloride 107, carbon dioxide 26,  BUN of 5, creatinine 0.8.  Glucose was 107.  SGOT was 19, SGPT was 16.  LDH  was slightly elevated at 312.  Uric acid was 5.8.  Cath UA showed a specific  gravity of less than 1.005 and 30 mg of protein.  Pupils were equal and  reactive to light.  Full range of motion in all extremities was noted.  The  patient's blood type is A positive, Rh antibody negative.  VDRL was positive  in September with a titer of 16.  In September, after treatment, it was down  to 1.8, and then at 27 weeks was 1.2.  While she was in the hospital, it was  1.4.  Rubella titer was non-immune.  Hepatitis B surface antigen was  negative.  Pap was normal.  GC and Chlamydia cultures were negative in  October and at 36 weeks.  Hemoglobin at  entry into the practice was 10.8.  It was 9.9 at 26 weeks.   ASSESSMENT:  1.  Thirteen days status post spontaneous vaginal birth.  2.  Preeclampsia.  3.  History of subarachnoid hemorrhage noted on September 01, 2004 and September 02, 2004 CT scans.   PLAN: 1.  Admit to ICU for consult with [name] as  attending physician.  2.  Magnesium sulfate therapy with a 4 gm load and a 2 gm bolus.  3.  Labetalol IV 20 mg now.  4.  Per consult with [name] in neurosurgery, CT repeat would be      reasonable.  [name] did elect to proceed with that.  5.  Vicodin for pain.  6.  A 24-hour urine.  7.  M.D.'s will follow and will refer discussion, follow up CT, and other      issues to [name].       VLL/MEDQ  D:  09/07/2004  T:  09/07/2004  Job:  [id number]

## 2010-08-24 ENCOUNTER — Inpatient Hospital Stay (INDEPENDENT_AMBULATORY_CARE_PROVIDER_SITE_OTHER)
Admission: RE | Admit: 2010-08-24 | Discharge: 2010-08-24 | Disposition: A | Discharge status: Home / Self Care | Payer: BC Managed Care – PPO | Source: Ambulatory Visit | Attending: Family Medicine | Admitting: Family Medicine

## 2010-08-24 DIAGNOSIS — H8309 Labyrinthitis, unspecified ear: Secondary | ICD-10-CM

## 2010-08-24 LAB — BASIC METABOLIC PANEL
BUN: 7 mg/dL (ref 6–23)
Chloride: 104 mEq/L (ref 96–112)
GFR calc non Af Amer: >60 mL/min (ref >60)
Glucose, Bld: 84 mg/dL (ref 70–99)
Potassium: 3.6 mEq/L (ref 3.5–5.1)
Sodium: 140 mEq/L (ref 135–145)

## 2010-08-24 LAB — CBC
HCT: 37.4 % (ref 36.0–46.0)
MCV: 89.9 fL (ref 78.0–100.0)
Platelets: 287 10*3/uL (ref 150–400)
RBC: 4.16 MIL/uL (ref 3.87–5.11)
RDW: 12.7 % (ref 11.5–15.5)
WBC: 7.8 10*3/uL (ref 4.0–10.5)

## 2010-08-24 LAB — POCT I-STAT, CHEM 8
BUN: 5 mg/dL — ABNORMAL LOW (ref 6–23)
Calcium, Ion: 1.17 mmol/L (ref 1.12–1.32)
Chloride: 105 mEq/L (ref 96–112)
HCT: 39 % (ref 36.0–46.0)
Sodium: 141 mEq/L (ref 135–145)

## 2010-09-15 ENCOUNTER — Inpatient Hospital Stay (HOSPITAL_COMMUNITY)
Admission: AD | Admit: 2010-09-15 | Discharge: 2010-09-15 | Disposition: A | Discharge status: Home / Self Care | Payer: BC Managed Care – PPO | Source: Ambulatory Visit | Attending: Obstetrics & Gynecology | Admitting: Obstetrics & Gynecology

## 2010-09-15 DIAGNOSIS — I10 Essential (primary) hypertension: Secondary | ICD-10-CM

## 2010-09-15 DIAGNOSIS — Z7982 Long term (current) use of aspirin: Secondary | ICD-10-CM | POA: Insufficient documentation

## 2010-09-15 DIAGNOSIS — N644 Mastodynia: Secondary | ICD-10-CM

## 2010-09-15 DIAGNOSIS — A5901 Trichomonal vulvovaginitis: Secondary | ICD-10-CM | POA: Insufficient documentation

## 2010-09-15 DIAGNOSIS — F17200 Nicotine dependence, unspecified, uncomplicated: Secondary | ICD-10-CM | POA: Insufficient documentation

## 2010-09-15 DIAGNOSIS — Z79899 Other long term (current) drug therapy: Secondary | ICD-10-CM

## 2010-09-15 LAB — URINE MICROSCOPIC-ADD ON

## 2010-09-15 LAB — POCT PREGNANCY, URINE: Preg Test, Ur: NEGATIVE

## 2010-09-15 LAB — URINALYSIS, ROUTINE W REFLEX MICROSCOPIC
Nitrite: NEGATIVE
Specific Gravity, Urine: 1.01 (ref 1.005–1.030)
Urobilinogen, UA: 0.2 mg/dL (ref 0.0–1.0)

## 2010-09-16 ENCOUNTER — Other Ambulatory Visit: Payer: Self-pay | Admitting: Obstetrics & Gynecology

## 2010-09-16 DIAGNOSIS — N63 Unspecified lump in unspecified breast: Secondary | ICD-10-CM

## 2010-09-16 DIAGNOSIS — N644 Mastodynia: Secondary | ICD-10-CM

## 2010-09-23 ENCOUNTER — Ambulatory Visit
Admission: RE | Admit: 2010-09-23 | Discharge: 2010-09-23 | Disposition: A | Discharge status: Home / Self Care | Payer: BC Managed Care – PPO | Source: Ambulatory Visit | Attending: Obstetrics & Gynecology | Admitting: Obstetrics & Gynecology

## 2010-09-23 DIAGNOSIS — N63 Unspecified lump in unspecified breast: Secondary | ICD-10-CM

## 2010-09-23 DIAGNOSIS — N644 Mastodynia: Secondary | ICD-10-CM

## 2010-12-14 ENCOUNTER — Emergency Department (HOSPITAL_COMMUNITY): Payer: BC Managed Care – PPO

## 2010-12-14 ENCOUNTER — Emergency Department (HOSPITAL_COMMUNITY)
Admission: EM | Admit: 2010-12-14 | Discharge: 2010-12-14 | Disposition: A | Discharge status: Home / Self Care | Payer: BC Managed Care – PPO | Attending: Emergency Medicine | Admitting: Emergency Medicine

## 2010-12-14 DIAGNOSIS — K219 Gastro-esophageal reflux disease without esophagitis: Secondary | ICD-10-CM | POA: Insufficient documentation

## 2010-12-14 DIAGNOSIS — Z79899 Other long term (current) drug therapy: Secondary | ICD-10-CM | POA: Insufficient documentation

## 2010-12-14 DIAGNOSIS — I10 Essential (primary) hypertension: Secondary | ICD-10-CM | POA: Insufficient documentation

## 2010-12-14 DIAGNOSIS — R0602 Shortness of breath: Secondary | ICD-10-CM | POA: Insufficient documentation

## 2010-12-14 DIAGNOSIS — R079 Chest pain, unspecified: Secondary | ICD-10-CM | POA: Insufficient documentation

## 2010-12-14 LAB — CBC
HCT: 40.6 % (ref 36.0–46.0)
Hemoglobin: 13.6 g/dL (ref 12.0–15.0)
MCH: 29.9 pg (ref 26.0–34.0)
MCHC: 33.5 g/dL (ref 30.0–36.0)
MCV: 89.2 fL (ref 78.0–100.0)
Platelets: 312 K/uL (ref 150–400)
RBC: 4.55 MIL/uL (ref 3.87–5.11)
RDW: 12.9 % (ref 11.5–15.5)
WBC: 7.9 K/uL (ref 4.0–10.5)

## 2010-12-14 LAB — POCT I-STAT, CHEM 8
BUN: 5 mg/dL — ABNORMAL LOW (ref 6–23)
Calcium, Ion: 1.27 mmol/L (ref 1.12–1.32)
Chloride: 105 meq/L (ref 96–112)
Creatinine, Ser: 0.7 mg/dL (ref 0.50–1.10)
Glucose, Bld: 92 mg/dL (ref 70–99)
HCT: 43 % (ref 36.0–46.0)
Hemoglobin: 14.6 g/dL (ref 12.0–15.0)
Potassium: 3.5 meq/L (ref 3.5–5.1)
Sodium: 141 meq/L (ref 135–145)
TCO2: 24 mmol/L (ref 0–100)

## 2010-12-14 LAB — DIFFERENTIAL
Eosinophils Absolute: 0.2 10*3/uL (ref 0.0–0.7)
Eosinophils Relative: 2 % (ref 0–5)
Lymphs Abs: 2.6 10*3/uL (ref 0.7–4.0)
Monocytes Absolute: 0.4 10*3/uL (ref 0.1–1.0)
Monocytes Relative: 6 % (ref 3–12)

## 2010-12-14 LAB — GLUCOSE, CAPILLARY: Glucose-Capillary: 93 mg/dL (ref 70–99)

## 2010-12-24 LAB — URINALYSIS, ROUTINE W REFLEX MICROSCOPIC
Bilirubin Urine: NEGATIVE
Glucose, UA: NEGATIVE
Specific Gravity, Urine: 1.015
Urobilinogen, UA: 1

## 2010-12-24 LAB — URINE MICROSCOPIC-ADD ON

## 2010-12-24 LAB — GC/CHLAMYDIA PROBE AMP, GENITAL
Chlamydia, DNA Probe: NEGATIVE
GC Probe Amp, Genital: NEGATIVE

## 2010-12-24 LAB — WET PREP, GENITAL: Yeast Wet Prep HPF POC: NONE SEEN

## 2010-12-27 LAB — WET PREP, GENITAL: Trich, Wet Prep: NONE SEEN

## 2010-12-27 LAB — URINE MICROSCOPIC-ADD ON

## 2010-12-27 LAB — URINALYSIS, ROUTINE W REFLEX MICROSCOPIC
Bilirubin Urine: NEGATIVE
Glucose, UA: NEGATIVE
Ketones, ur: NEGATIVE
Leukocytes, UA: NEGATIVE
pH: 6.5

## 2010-12-30 LAB — WET PREP, GENITAL: Yeast Wet Prep HPF POC: NONE SEEN

## 2010-12-30 LAB — DIFFERENTIAL
Basophils Absolute: 0.1 10*3/uL (ref 0.0–0.1)
Basophils Relative: 1 % (ref 0–1)
Eosinophils Absolute: 0.1 10*3/uL (ref 0.0–0.7)
Lymphocytes Relative: 24 % (ref 12–46)
Monocytes Relative: 5 % (ref 3–12)
Neutro Abs: 6 10*3/uL (ref 1.7–7.7)
Neutrophils Relative %: 69 % (ref 43–77)

## 2010-12-30 LAB — URINALYSIS, ROUTINE W REFLEX MICROSCOPIC
Glucose, UA: NEGATIVE mg/dL
Ketones, ur: NEGATIVE mg/dL
pH: 6 (ref 5.0–8.0)

## 2010-12-30 LAB — URINE MICROSCOPIC-ADD ON

## 2010-12-30 LAB — POCT PREGNANCY, URINE: Preg Test, Ur: NEGATIVE

## 2010-12-30 LAB — CBC
Hemoglobin: 12.5 g/dL (ref 12.0–15.0)
MCHC: 34.2 g/dL (ref 30.0–36.0)
RBC: 3.97 MIL/uL (ref 3.87–5.11)

## 2010-12-30 LAB — GC/CHLAMYDIA PROBE AMP, GENITAL: Chlamydia, DNA Probe: NEGATIVE

## 2011-02-09 ENCOUNTER — Encounter (HOSPITAL_COMMUNITY): Payer: Self-pay | Admitting: *Deleted

## 2011-02-09 ENCOUNTER — Inpatient Hospital Stay (HOSPITAL_COMMUNITY)
Admission: AD | Admit: 2011-02-09 | Discharge: 2011-02-09 | Disposition: A | Discharge status: Home / Self Care | Payer: BC Managed Care – PPO | Source: Ambulatory Visit | Attending: Obstetrics & Gynecology | Admitting: Obstetrics & Gynecology

## 2011-02-09 DIAGNOSIS — R109 Unspecified abdominal pain: Secondary | ICD-10-CM | POA: Insufficient documentation

## 2011-02-09 DIAGNOSIS — N390 Urinary tract infection, site not specified: Secondary | ICD-10-CM | POA: Insufficient documentation

## 2011-02-09 HISTORY — DX: Trichomoniasis, unspecified: A59.9

## 2011-02-09 HISTORY — DX: Syphilis, unspecified: A53.9

## 2011-02-09 LAB — URINE MICROSCOPIC-ADD ON

## 2011-02-09 LAB — DIFFERENTIAL
Basophils Relative: 0 % (ref 0–1)
Eosinophils Absolute: 0.2 10*3/uL (ref 0.0–0.7)
Eosinophils Relative: 2 % (ref 0–5)
Lymphs Abs: 3.4 10*3/uL (ref 0.7–4.0)
Monocytes Absolute: 0.5 10*3/uL (ref 0.1–1.0)
Monocytes Relative: 5 % (ref 3–12)

## 2011-02-09 LAB — URINALYSIS, ROUTINE W REFLEX MICROSCOPIC
Bilirubin Urine: NEGATIVE
Nitrite: NEGATIVE
Specific Gravity, Urine: >1.03 — ABNORMAL HIGH (ref 1.005–1.030)
Urobilinogen, UA: 1 mg/dL (ref 0.0–1.0)
pH: 6 (ref 5.0–8.0)

## 2011-02-09 LAB — POCT PREGNANCY, URINE: Preg Test, Ur: NEGATIVE

## 2011-02-09 LAB — CBC
HCT: 37.3 % (ref 36.0–46.0)
Hemoglobin: 12.3 g/dL (ref 12.0–15.0)
MCH: 30.5 pg (ref 26.0–34.0)
MCHC: 33 g/dL (ref 30.0–36.0)
MCV: 92.6 fL (ref 78.0–100.0)
RBC: 4.03 MIL/uL (ref 3.87–5.11)

## 2011-02-09 MED ORDER — KETOROLAC TROMETHAMINE 60 MG/2ML IM SOLN
INTRAMUSCULAR | Status: AC
  Administered 2011-02-09: 60 mg
  Filled 2011-02-09: qty 2

## 2011-02-09 MED ORDER — KETOROLAC TROMETHAMINE 60 MG/2ML IM SOLN: 60.0000 mg | Freq: Once | INTRAMUSCULAR | Status: DC

## 2011-02-09 MED ORDER — PHENAZOPYRIDINE HCL 100 MG PO TABS: 100.0000 mg | ORAL_TABLET | Freq: Three times a day (TID) | ORAL | Status: AC | PRN

## 2011-02-09 MED ORDER — SULFAMETHOXAZOLE-TRIMETHOPRIM 800-160 MG PO TABS: 1.0000 | ORAL_TABLET | Freq: Two times a day (BID) | ORAL | Status: AC

## 2011-02-09 NOTE — Progress Notes (Signed)
Pt c/o lower abdominal pain that started today. Clear, odorous discharge. Pt has taken nothing for pain.

## 2011-02-09 NOTE — Progress Notes (Signed)
Patient states she had sudden onset of lower abdominal pain that sometimes goes into her back. Slight clear discharge.

## 2011-02-09 NOTE — ED Provider Notes (Signed)
History     CSN: [id number] Arrival date & time: 02/09/2011  4:36 PM   None     Chief Complaint  Patient presents with  . Abdominal Pain    HPI   [name]  Is a 35 y.o. female who presents to MAU for vaginal discharge and low abdominal pain. The pain started early today and is a sharp then cramping pain. Vaginal discharge is white. LMP 01/27/12, no birth control. Current sex partner x 6 months. History of trichomonas and syphilis. Last pap smear this year and was normal in the office. The history was provided by the patient. No past medical history on file.  No past surgical history on file.  No family history on file.  History  Substance Use Topics  . Smoking status: Not on file  . Smokeless tobacco: Not on file  . Alcohol Use: Not on file    OB History    Grav Para Term Preterm Abortions TAB SAB Ect Mult Living                  Review of Systems  Constitutional: Negative for fever, chills, diaphoresis and fatigue.  HENT: Negative for ear pain, congestion, sore throat, facial swelling, neck pain, neck stiffness, dental problem and sinus pressure.   Eyes: Negative for photophobia, pain and discharge.  Respiratory: Negative for cough, chest tightness and wheezing.   Cardiovascular: Negative.   Gastrointestinal: Positive for abdominal pain. Negative for nausea, vomiting, diarrhea, constipation and abdominal distention.  Genitourinary: Positive for vaginal discharge, pelvic pain and dyspareunia. Negative for dysuria, frequency, flank pain, vaginal bleeding and difficulty urinating.  Musculoskeletal: Positive for back pain. Negative for myalgias and gait problem.  Skin: Negative for color change and rash.  Neurological: Negative for dizziness, speech difficulty, weakness, light-headedness, numbness and headaches.  Psychiatric/Behavioral: Negative for confusion and agitation. The patient is not nervous/anxious.     Allergies  Review of patient's allergies indicates  no known allergies.  Home Medications  No current outpatient prescriptions on file.  BP 157/101  Pulse 88  Temp(Src) 98.2 F (36.8 C) (Oral)  Resp 16  Ht 5\' 1"  (1.549 m)  Wt 155 lb 6.4 oz (70.489 kg)  BMI 29.36 kg/m2  SpO2 99%  LMP 01/27/2011  Physical Exam  Nursing note and vitals reviewed. Constitutional: She is oriented to person, place, and time. She appears well-developed and well-nourished.  HENT:  Head: Normocephalic.  Eyes: EOM are normal.  Neck: Neck supple.  Cardiovascular: Normal rate.   Pulmonary/Chest: Effort normal.  Abdominal: Soft. There is no tenderness.  Musculoskeletal: Normal range of motion.  Neurological: She is alert and oriented to person, place, and time. No cranial nerve deficit.  Skin: Skin is warm and dry.  Psychiatric: She has a normal mood and affect. Her behavior is normal. Judgment and thought content normal.   Results for orders placed during the hospital encounter of 02/09/11 (from the past 24 hour(s))  WET PREP, GENITAL     Status: Abnormal   Collection Time   02/09/11  4:20 PM      Component Value Range   Yeast, Wet Prep NONE SEEN  NONE SEEN    Trich, Wet Prep NONE SEEN  NONE SEEN    Clue Cells, Wet Prep FEW (*) NONE SEEN    WBC, Wet Prep HPF POC FEW (*) NONE SEEN   URINALYSIS, ROUTINE W REFLEX MICROSCOPIC     Status: Abnormal   Collection Time   02/09/11  4:55  PM      Component Value Range   Color, Urine YELLOW  YELLOW    Appearance HAZY (*) CLEAR    Specific Gravity, Urine >1.030 (*) 1.005 - 1.030    pH 6.0  5.0 - 8.0    Glucose, UA NEGATIVE  NEGATIVE (mg/dL)   Hgb urine dipstick TRACE (*) NEGATIVE    Bilirubin Urine NEGATIVE  NEGATIVE    Ketones, ur 15 (*) NEGATIVE (mg/dL)   Protein, ur NEGATIVE  NEGATIVE (mg/dL)   Urobilinogen, UA 1.0  0.0 - 1.0 (mg/dL)   Nitrite NEGATIVE  NEGATIVE    Leukocytes, UA SMALL (*) NEGATIVE   URINE MICROSCOPIC-ADD ON     Status: Abnormal   Collection Time   02/09/11  4:55 PM      Component  Value Range   Squamous Epithelial / LPF FEW (*) RARE    WBC, UA 11-20  <3 (WBC/hpf)   RBC / HPF 0-2  <3 (RBC/hpf)   Bacteria, UA FEW (*) RARE    Urine-Other MUCOUS PRESENT    POCT PREGNANCY, URINE     Status: Normal   Collection Time   02/09/11  5:00 PM      Component Value Range   Preg Test, Ur NEGATIVE    CBC     Status: Normal   Collection Time   02/09/11  5:40 PM      Component Value Range   WBC 9.1  4.0 - 10.5 (K/uL)   RBC 4.03  3.87 - 5.11 (MIL/uL)   Hemoglobin 12.3  12.0 - 15.0 (g/dL)   HCT 16.1  09.6 - 04.5 (%)   MCV 92.6  78.0 - 100.0 (fL)   MCH 30.5  26.0 - 34.0 (pg)   MCHC 33.0  30.0 - 36.0 (g/dL)   RDW 40.9  81.1 - 91.4 (%)   Platelets 260  150 - 400 (K/uL)  DIFFERENTIAL     Status: Normal   Collection Time   02/09/11  5:40 PM      Component Value Range   Neutrophils Relative 55  43 - 77 (%)   Neutro Abs 5.0  1.7 - 7.7 (K/uL)   Lymphocytes Relative 37  12 - 46 (%)   Lymphs Abs 3.4  0.7 - 4.0 (K/uL)   Monocytes Relative 5  3 - 12 (%)   Monocytes Absolute 0.5  0.1 - 1.0 (K/uL)   Eosinophils Relative 2  0 - 5 (%)   Eosinophils Absolute 0.2  0.0 - 0.7 (K/uL)   Basophils Relative 0  0 - 1 (%)   Basophils Absolute 0.0  0.0 - 0.1 (K/uL)    Assessment: UTI  Plan:  Septra DS   Pyridium   Follow up in the office   Return here as needed. ED Course  Procedures   MDM          Kerrie Buffalo, NP 02/09/11 1815

## 2011-02-10 LAB — GC/CHLAMYDIA PROBE AMP, GENITAL
Chlamydia, DNA Probe: NEGATIVE
GC Probe Amp, Genital: NEGATIVE

## 2011-04-03 ENCOUNTER — Encounter (HOSPITAL_COMMUNITY): Payer: Self-pay

## 2011-04-03 ENCOUNTER — Inpatient Hospital Stay (HOSPITAL_COMMUNITY)
Admission: AD | Admit: 2011-04-03 | Discharge: 2011-04-03 | Disposition: A | Discharge status: Home / Self Care | Payer: BC Managed Care – PPO | Source: Ambulatory Visit | Attending: Obstetrics | Admitting: Obstetrics

## 2011-04-03 DIAGNOSIS — N390 Urinary tract infection, site not specified: Secondary | ICD-10-CM | POA: Insufficient documentation

## 2011-04-03 DIAGNOSIS — R109 Unspecified abdominal pain: Secondary | ICD-10-CM | POA: Insufficient documentation

## 2011-04-03 LAB — POCT PREGNANCY, URINE: Preg Test, Ur: NEGATIVE

## 2011-04-03 LAB — URINALYSIS, ROUTINE W REFLEX MICROSCOPIC
Ketones, ur: NEGATIVE mg/dL
Nitrite: POSITIVE — AB
Specific Gravity, Urine: 1.01 (ref 1.005–1.030)
pH: 6.5 (ref 5.0–8.0)

## 2011-04-03 LAB — WET PREP, GENITAL

## 2011-04-03 LAB — URINE MICROSCOPIC-ADD ON

## 2011-04-03 MED ORDER — KETOROLAC TROMETHAMINE 60 MG/2ML IM SOLN
60.0000 mg | Freq: Once | INTRAMUSCULAR | Status: AC
  Administered 2011-04-03: 60 mg via INTRAMUSCULAR
  Filled 2011-04-03: qty 2

## 2011-04-03 MED ORDER — FLUCONAZOLE 150 MG PO TABS: 150.0000 mg | ORAL_TABLET | Freq: Once | ORAL | Status: AC

## 2011-04-03 MED ORDER — SULFAMETHOXAZOLE-TRIMETHOPRIM 800-160 MG PO TABS: 1.0000 | ORAL_TABLET | Freq: Two times a day (BID) | ORAL | Status: AC

## 2011-04-03 NOTE — Progress Notes (Signed)
Patient presents with c/o right lower abdominal pain since  Last night feels like originating from kidney area around to pelvic are, no burning with urination, urinary frequency having tingling sensation at the end of stream, LMP 03/26/11

## 2011-04-03 NOTE — ED Provider Notes (Signed)
History     Chief Complaint  Patient presents with  . Abdominal Pain   HPI [name] 36 y.o. Comes to MAU with lower abdominal pain which radiates to right side and back  OB History    Grav Para Term Preterm Abortions TAB SAB Ect Mult Living   2 2 2       2       Past Medical History  Diagnosis Date  . Hypertension   . Trichomonal infection   . Syphilis     Past Surgical History  Procedure Date  . Gallbladder surgery     No family history on file.  History  Substance Use Topics  . Smoking status: Current Everyday Smoker -- 0.5 packs/day  . Smokeless tobacco: Not on file  . Alcohol Use: Yes    Allergies: No Known Allergies  Prescriptions prior to admission  Medication Sig Dispense Refill  . diltiazem (DILACOR XR) 240 MG 24 hr capsule Take 240 mg by mouth daily.        Marland Kitchen ibuprofen (ADVIL,MOTRIN) 200 MG tablet Take 200 mg by mouth every 6 (six) hours as needed. Headache         Review of Systems  Gastrointestinal: Positive for abdominal pain. Negative for nausea and vomiting.  Genitourinary: Positive for dysuria.   Physical Exam   Blood pressure 154/96, pulse 87, temperature 97.6 F (36.4 C), temperature source Oral, resp. rate 16, height 5' (1.524 m), weight 150 lb 12.8 oz (68.402 kg), last menstrual period 03/25/2011.  Physical Exam  Nursing note and vitals reviewed. Constitutional: She is oriented to person, place, and time. She appears well-developed and well-nourished.  HENT:  Head: Normocephalic.  Eyes: EOM are normal.  Neck: Neck supple.  GI: Soft. There is tenderness. There is no rebound and no guarding.  Genitourinary:       Speculum exam: Vagina - Small amount of creamy discharge, some adherent discharge noted, no odor Cervix - No contact bleeding Bimanual exam: Cervix closed Uterus mildly tender, normal size, most tenderness noted over bladder Adnexa mild tenderness in Right, no masses bilaterally GC/Chlam, wet prep done Chaperone  present for exam. No CVA tenderness  Musculoskeletal: Normal range of motion.  Neurological: She is alert and oriented to person, place, and time.  Skin: Skin is warm and dry.  Psychiatric: She has a normal mood and affect.    MAU Course  Procedures  MDM Results for orders placed during the hospital encounter of 04/03/11 (from the past 24 hour(s))  URINALYSIS, ROUTINE W REFLEX MICROSCOPIC     Status: Abnormal   Collection Time   04/03/11 12:50 PM      Component Value Range   Color, Urine YELLOW  YELLOW    APPearance HAZY (*) CLEAR    Specific Gravity, Urine 1.010  1.005 - 1.030    pH 6.5  5.0 - 8.0    Glucose, UA NEGATIVE  NEGATIVE (mg/dL)   Hgb urine dipstick MODERATE (*) NEGATIVE    Bilirubin Urine NEGATIVE  NEGATIVE    Ketones, ur NEGATIVE  NEGATIVE (mg/dL)   Protein, ur NEGATIVE  NEGATIVE (mg/dL)   Urobilinogen, UA 0.2  0.0 - 1.0 (mg/dL)   Nitrite POSITIVE (*) NEGATIVE    Leukocytes, UA MODERATE (*) NEGATIVE   URINE MICROSCOPIC-ADD ON     Status: Abnormal   Collection Time   04/03/11 12:50 PM      Component Value Range   Squamous Epithelial / LPF FEW (*) RARE  WBC, UA 11-20  <3 (WBC/hpf)   RBC / HPF 7-10  <3 (RBC/hpf)   Bacteria, UA MANY (*) RARE   POCT PREGNANCY, URINE     Status: Normal   Collection Time   04/03/11 12:51 PM      Component Value Range   Preg Test, Ur NEGATIVE    WET PREP, GENITAL     Status: Abnormal   Collection Time   04/03/11  1:05 PM      Component Value Range   Yeast, Wet Prep NONE SEEN  NONE SEEN    Trich, Wet Prep NONE SEEN  NONE SEEN    Clue Cells, Wet Prep FEW (*) NONE SEEN    WBC, Wet Prep HPF POC MANY (*) NONE SEEN     Assessment and Plan  UTI  Plan Will treat UTI with septra x 3 days Will given rx for diflucan in case develops vaginal itching Follow up in the office if develops fever, worsening back pain or body aches.  Amai Cappiello 04/03/2011, 1:43 PM   Nolene Bernheim, NP 04/03/11 1349  Nolene Bernheim, NP 04/03/11 1353

## 2011-04-04 LAB — GC/CHLAMYDIA PROBE AMP, GENITAL: Chlamydia, DNA Probe: NEGATIVE

## 2011-11-02 ENCOUNTER — Encounter (HOSPITAL_COMMUNITY): Payer: Self-pay | Admitting: *Deleted

## 2011-11-02 ENCOUNTER — Inpatient Hospital Stay (HOSPITAL_COMMUNITY)
Admission: AD | Admit: 2011-11-02 | Discharge: 2011-11-02 | Disposition: A | Discharge status: Home / Self Care | Payer: BC Managed Care – PPO | Source: Ambulatory Visit | Attending: Obstetrics | Admitting: Obstetrics

## 2011-11-02 ENCOUNTER — Inpatient Hospital Stay (HOSPITAL_COMMUNITY): Payer: BC Managed Care – PPO

## 2011-11-02 DIAGNOSIS — D259 Leiomyoma of uterus, unspecified: Secondary | ICD-10-CM

## 2011-11-02 DIAGNOSIS — B9689 Other specified bacterial agents as the cause of diseases classified elsewhere: Secondary | ICD-10-CM | POA: Insufficient documentation

## 2011-11-02 DIAGNOSIS — R1032 Left lower quadrant pain: Secondary | ICD-10-CM | POA: Insufficient documentation

## 2011-11-02 DIAGNOSIS — A599 Trichomoniasis, unspecified: Secondary | ICD-10-CM

## 2011-11-02 DIAGNOSIS — N72 Inflammatory disease of cervix uteri: Secondary | ICD-10-CM | POA: Insufficient documentation

## 2011-11-02 DIAGNOSIS — N760 Acute vaginitis: Secondary | ICD-10-CM | POA: Insufficient documentation

## 2011-11-02 DIAGNOSIS — D219 Benign neoplasm of connective and other soft tissue, unspecified: Secondary | ICD-10-CM

## 2011-11-02 DIAGNOSIS — A499 Bacterial infection, unspecified: Secondary | ICD-10-CM | POA: Insufficient documentation

## 2011-11-02 LAB — CBC WITH DIFFERENTIAL/PLATELET
Basophils Absolute: 0 10*3/uL (ref 0.0–0.1)
Basophils Relative: 0 % (ref 0–1)
Eosinophils Relative: 3 % (ref 0–5)
Hemoglobin: 12.3 g/dL (ref 12.0–15.0)
Lymphocytes Relative: 28 % (ref 12–46)
MCH: 30.3 pg (ref 26.0–34.0)
MCHC: 33 g/dL (ref 30.0–36.0)
Monocytes Absolute: 0.6 10*3/uL (ref 0.1–1.0)
RBC: 4.06 MIL/uL (ref 3.87–5.11)
RDW: 13 % (ref 11.5–15.5)
WBC: 8.6 10*3/uL (ref 4.0–10.5)

## 2011-11-02 LAB — WET PREP, GENITAL

## 2011-11-02 MED ORDER — METRONIDAZOLE 500 MG PO TABS: 500.0000 mg | ORAL_TABLET | Freq: Two times a day (BID) | ORAL | Status: AC

## 2011-11-02 MED ORDER — CEFTRIAXONE SODIUM 250 MG IJ SOLR
250.0000 mg | Freq: Once | INTRAMUSCULAR | Status: AC
  Administered 2011-11-02: 250 mg via INTRAMUSCULAR
  Filled 2011-11-02: qty 250

## 2011-11-02 MED ORDER — KETOROLAC TROMETHAMINE 60 MG/2ML IM SOLN
60.0000 mg | Freq: Once | INTRAMUSCULAR | Status: AC
  Administered 2011-11-02: 60 mg via INTRAMUSCULAR
  Filled 2011-11-02: qty 2

## 2011-11-02 MED ORDER — OXYCODONE-ACETAMINOPHEN 5-325 MG PO TABS
2.0000 | ORAL_TABLET | Freq: Once | ORAL | Status: AC
  Administered 2011-11-02: 2 via ORAL
  Filled 2011-11-02: qty 2

## 2011-11-02 MED ORDER — AZITHROMYCIN 250 MG PO TABS
1000.0000 mg | ORAL_TABLET | Freq: Once | ORAL | Status: AC
  Administered 2011-11-02: 1000 mg via ORAL
  Filled 2011-11-02: qty 4

## 2011-11-02 NOTE — MAU Note (Signed)
Pt states her LLQ abd pain started at 0400 and is sharp in nature.  Pt was awaken from sleep with the pain.  No vaginal bleeding or discharge.

## 2011-11-02 NOTE — MAU Provider Note (Signed)
History     CSN: [id number]  Arrival date and time: 11/02/11 4098   First Provider Initiated Contact with Patient 11/02/11 0757      Chief Complaint  Patient presents with  . Abdominal Pain   HPI [name] is a 36 y.o. female who presents to MAU with abdominal pain. The pain started approximately 4 am. The pain is located in the LLQ of the abdomen. She rates the pain as 10/10. She describes the pain as sharp that comes and goes. Patient's last menstrual period was 10/21/2011. Last pap smear one year ago and was normal. Current sex partner x 1 year. Last sexual intercourse 3 days ago without pain. Hx of trichomonas and syphilis. Last office visit 2 months ago. The history was provided by the patient.  Pt states that she has a history of ruptured ovarian cyst and also UTI with similar pain.  She has not taken anything for pain.  OB History    Grav Para Term Preterm Abortions TAB SAB Ect Mult Living   2 2 2       2       Past Medical History  Diagnosis Date  . Hypertension   . Trichomonal infection   . Syphilis     Past Surgical History  Procedure Date  . Gallbladder surgery     Family History  Problem Relation Age of Onset  . Hypertension Father   . Early death Father     History  Substance Use Topics  . Smoking status: Current Everyday Smoker -- 0.5 packs/day    Types: Cigarettes  . Smokeless tobacco: Never Used  . Alcohol Use: Yes    Allergies: No Known Allergies  Prescriptions prior to admission  Medication Sig Dispense Refill  . diltiazem (DILACOR XR) 240 MG 24 hr capsule Take 240 mg by mouth daily.        Marland Kitchen ibuprofen (ADVIL,MOTRIN) 200 MG tablet Take 200 mg by mouth every 6 (six) hours as needed. Headache         Review of Systems  Constitutional: Negative for fever, chills and weight loss.  HENT: Negative for ear pain, nosebleeds, congestion, sore throat and neck pain.   Eyes: Negative for blurred vision, double vision, photophobia and pain.    Respiratory: Negative for cough, shortness of breath and wheezing.   Cardiovascular: Negative for chest pain, palpitations and leg swelling.  Gastrointestinal: Positive for abdominal pain. Negative for heartburn, nausea, vomiting, diarrhea and constipation.  Genitourinary: Positive for urgency and frequency. Negative for dysuria.  Musculoskeletal: Negative for myalgias and back pain.  Skin: Negative for itching and rash.  Neurological: Negative for dizziness, sensory change, speech change, seizures, weakness and headaches.  Endo/Heme/Allergies: Does not bruise/bleed easily.  Psychiatric/Behavioral: Negative for depression. The patient is not nervous/anxious and does not have insomnia.    Physical Exam   Blood pressure 134/80, pulse 64, temperature 98.8 F (37.1 C), temperature source Oral, resp. rate 20, height 5' (1.524 m), weight 160 lb (72.576 kg), last menstrual period 10/21/2011, SpO2 100.00%.  Physical Exam  Nursing note and vitals reviewed. Constitutional: She is oriented to person, place, and time. She appears well-developed and well-nourished. No distress.  HENT:  Head: Normocephalic and atraumatic.  Eyes: EOM are normal.  Neck: Neck supple.  Cardiovascular: Normal rate.   Respiratory: Effort normal.  GI: Soft. There is tenderness in the left lower quadrant. There is no rigidity, no rebound, no guarding and no CVA tenderness.  Genitourinary:  Mod amount of frothy yellow discharge in vault; cervix tender; uterus NSSC tender to touch;  Adnexa bilateral tenderness with left more than right- no rebound  Musculoskeletal: Normal range of motion.  Neurological: She is alert and oriented to person, place, and time.  Skin: Skin is warm and dry.  Psychiatric: She has a normal mood and affect. Her behavior is normal. Judgment and thought content normal.   Toradol 60 mg IM  MAU Course: Care turned over to Pamelia Hoit @ 08:20 am,  Procedures Clinical Data: Acute onset left  lower quadrant pain. Fibroids.  LMP 10/21/2011.  TRANSABDOMINAL AND TRANSVAGINAL ULTRASOUND OF PELVIS  Technique: Both transabdominal and transvaginal ultrasound  examinations of the pelvis were performed. Transabdominal  technique was performed for global imaging of the pelvis including  uterus, ovaries, adnexal regions, and pelvic cul-de-sac.  It was necessary to proceed with endovaginal exam following the  transabdominal exam to visualize the ovaries.  Comparison: 04/02/2009  Findings:  Uterus: 7.9 x 4.4 x 5.9 cm. Several fibroids are seen. The  largest is subserosal in the right anterior corpus measuring 2.8 cm  in maximum diameter. At least four other smaller fibroids are seen  measuring between 1 and 2 cm in maximum diameter. Two of these  fibroids in the posterior corpus have partial intracavitary  components representing less than 50% of their surface area.  Endometrium: Double layer thickness measures 10 mm. No focal  lesion visualized.  Right ovary: 2.5 x 1.7 x 1.8 cm. Normal appearance.  Left ovary: 3.5 x 1.6 x 2.4 cm. Normal appearance. A simple left  para ovarian cyst is also noted measuring 2.0 x 1.3 x 1.3 cm, which  has benign characteristics.  Other Findings: No free fluid  IMPRESSION:  1. Increased size and number of small uterine fibroids, largest  measuring 2.8 cm. Two smaller, approximately 1 cm fibroids in the  posterior corpus have partial intracavitary components.  2. Normal ovaries with small benign left para ovarian cyst  incidentally noted.  3. No evidence of adnexal mass or other acute findings.  Original Report Authenticated By: Danae Orleans, M.D.      Results for orders placed during the hospital encounter of 11/02/11 (from the past 24 hour(s))  CBC WITH DIFFERENTIAL     Status: Normal   Collection Time   11/02/11  8:20 AM      Component Value Range   WBC 8.6  4.0 - 10.5 K/uL   RBC 4.06  3.87 - 5.11 MIL/uL   Hemoglobin 12.3  12.0 - 15.0 g/dL    HCT 16.1  09.6 - 04.5 %   MCV 91.9  78.0 - 100.0 fL   MCH 30.3  26.0 - 34.0 pg   MCHC 33.0  30.0 - 36.0 g/dL   RDW 40.9  81.1 - 91.4 %   Platelets 267  150 - 400 K/uL   Neutrophils Relative 62  43 - 77 %   Neutro Abs 5.4  1.7 - 7.7 K/uL   Lymphocytes Relative 28  12 - 46 %   Lymphs Abs 2.4  0.7 - 4.0 K/uL   Monocytes Relative 7  3 - 12 %   Monocytes Absolute 0.6  0.1 - 1.0 K/uL   Eosinophils Relative 3  0 - 5 %   Eosinophils Absolute 0.2  0.0 - 0.7 K/uL   Basophils Relative 0  0 - 1 %   Basophils Absolute 0.0  0.0 - 0.1 K/uL  WET PREP, GENITAL  Status: Abnormal   Collection Time   11/02/11  8:36 AM      Component Value Range   Yeast Wet Prep HPF POC STAT (*) NONE SEEN   Trich, Wet Prep MODERATE (*) NONE SEEN   Clue Cells Wet Prep HPF POC MODERATE (*) NONE SEEN   WBC, Wet Prep HPF POC MODERATE (*) NONE SEEN    NEESE,HOPE, RN, FNP, BC 11/02/2011, 7:58 AM  Assessment/Plan Cervicitis- treated in MAU with Rocephin 250mg  IM and Zithromax 1 gm BV and Trich- prescription for flagyl 500mg  BID for 7 days F/u with Dr. Tamela Oddi

## 2012-03-01 ENCOUNTER — Other Ambulatory Visit (HOSPITAL_COMMUNITY): Payer: Self-pay | Admitting: Cardiology

## 2012-03-01 DIAGNOSIS — R011 Cardiac murmur, unspecified: Secondary | ICD-10-CM

## 2012-03-08 ENCOUNTER — Ambulatory Visit (HOSPITAL_COMMUNITY)
Admission: RE | Admit: 2012-03-08 | Discharge: 2012-03-08 | Disposition: A | Discharge status: Home / Self Care | Payer: BC Managed Care – PPO | Source: Ambulatory Visit | Attending: Cardiology | Admitting: Cardiology

## 2012-03-08 DIAGNOSIS — F17200 Nicotine dependence, unspecified, uncomplicated: Secondary | ICD-10-CM | POA: Insufficient documentation

## 2012-03-08 DIAGNOSIS — R002 Palpitations: Secondary | ICD-10-CM | POA: Insufficient documentation

## 2012-03-08 DIAGNOSIS — I10 Essential (primary) hypertension: Secondary | ICD-10-CM | POA: Insufficient documentation

## 2012-03-08 DIAGNOSIS — R011 Cardiac murmur, unspecified: Secondary | ICD-10-CM

## 2012-03-08 DIAGNOSIS — I079 Rheumatic tricuspid valve disease, unspecified: Secondary | ICD-10-CM | POA: Insufficient documentation

## 2012-03-08 DIAGNOSIS — I517 Cardiomegaly: Secondary | ICD-10-CM | POA: Insufficient documentation

## 2012-03-08 NOTE — Progress Notes (Signed)
[facility]   2D echo completed 03/08/2012.   Cindy Adetokunbo Mccadden, RDCS   

## 2012-04-02 ENCOUNTER — Encounter (HOSPITAL_COMMUNITY): Payer: Self-pay | Admitting: *Deleted

## 2012-04-02 ENCOUNTER — Emergency Department (INDEPENDENT_AMBULATORY_CARE_PROVIDER_SITE_OTHER)
Admission: EM | Admit: 2012-04-02 | Discharge: 2012-04-02 | Disposition: A | Discharge status: Home / Self Care | Payer: BC Managed Care – PPO | Source: Home / Self Care | Attending: Emergency Medicine | Admitting: Emergency Medicine

## 2012-04-02 DIAGNOSIS — L02419 Cutaneous abscess of limb, unspecified: Secondary | ICD-10-CM

## 2012-04-02 DIAGNOSIS — L03115 Cellulitis of right lower limb: Secondary | ICD-10-CM

## 2012-04-02 DIAGNOSIS — L03119 Cellulitis of unspecified part of limb: Secondary | ICD-10-CM

## 2012-04-02 LAB — CBC WITH DIFFERENTIAL/PLATELET
Basophils Relative: 0 % (ref 0–1)
Eosinophils Absolute: 0.2 10*3/uL (ref 0.0–0.7)
Eosinophils Relative: 3 % (ref 0–5)
Hemoglobin: 13.7 g/dL (ref 12.0–15.0)
MCH: 31.4 pg (ref 26.0–34.0)
MCHC: 34.6 g/dL (ref 30.0–36.0)
Monocytes Relative: 7 % (ref 3–12)
Neutrophils Relative %: 56 % (ref 43–77)

## 2012-04-02 MED ORDER — LIDOCAINE HCL (PF) 1 % IJ SOLN
INTRAMUSCULAR | Status: AC
  Filled 2012-04-02: qty 5

## 2012-04-02 MED ORDER — CEFTRIAXONE SODIUM 1 G IJ SOLR
INTRAMUSCULAR | Status: AC
  Filled 2012-04-02: qty 10

## 2012-04-02 MED ORDER — CEPHALEXIN 500 MG PO CAPS: 500.0000 mg | ORAL_CAPSULE | Freq: Four times a day (QID) | ORAL | Status: DC

## 2012-04-02 MED ORDER — CEFTRIAXONE SODIUM 1 G IJ SOLR
1.0000 g | Freq: Once | INTRAMUSCULAR | Status: AC
  Administered 2012-04-02: 1 g via INTRAMUSCULAR

## 2012-04-02 NOTE — ED Notes (Signed)
Pt reports right lower leg tenderness, redness, swelling with no known injury since friday - states that she did wear some thigh high boots that had been in her trunk for over a month - noticed redness after that.

## 2012-04-02 NOTE — ED Provider Notes (Signed)
History     CSN: [id number]  Arrival date & time 04/02/12  1039   None     Chief Complaint  Patient presents with  . Leg Pain    (Consider location/radiation/quality/duration/timing/severity/associated sxs/prior treatment) HPI Comments: 37 year old female presents with right lower leg and ankle swelling and pain. She states the day before (Friday ) she had worn a pair of boots that she had not worn in. Time. The next morning she awoke with 3 small papules on her lateral ankle that were tender and associated with mild puffiness in the ankle. Over the ensuing hours  She developed edema extending up the leg approximately half way. There is swelling, erythema and tenderness of the anterior and posterior portion of the leg. Minor calf tenderness. She is able to dorsiflex and plantar flex without pain. She did not actually see any sort of bug or spider that may have caused a bite. She denies systemic symptoms such as fever or chills.   Past Medical History  Diagnosis Date  . Hypertension   . Trichomonal infection   . Syphilis     Past Surgical History  Procedure Date  . Gallbladder surgery     Family History  Problem Relation Age of Onset  . Hypertension Father   . Early death Father     History  Substance Use Topics  . Smoking status: Current Every Day Smoker -- 0.5 packs/day    Types: Cigarettes  . Smokeless tobacco: Never Used  . Alcohol Use: Yes    OB History    Grav Para Term Preterm Abortions TAB SAB Ect Mult Living   2 2 2       2       Review of Systems  Constitutional: Negative.   Respiratory: Negative.   Cardiovascular: Negative.   Gastrointestinal: Negative.   Musculoskeletal: Positive for myalgias. Negative for back pain and joint swelling.  Skin: Positive for color change.  Neurological: Negative.     Allergies  Review of patient's allergies indicates no known allergies.  Home Medications   Current Outpatient Rx  Name  Route  Sig  Dispense   Refill  . CEPHALEXIN 500 MG PO CAPS   Oral   Take 1 capsule (500 mg total) by mouth 4 (four) times daily. X 10 days   40 capsule   0   . DILTIAZEM HCL ER 240 MG PO CP24   Oral   Take 240 mg by mouth daily.           . IBUPROFEN 200 MG PO TABS   Oral   Take 200 mg by mouth every 6 (six) hours as needed. Headache            BP 151/92  Pulse 72  Temp 97.9 F (36.6 C) (Oral)  Resp 18  SpO2 98%  Physical Exam  Constitutional: She is oriented to person, place, and time. She appears well-developed and well-nourished. No distress.  Eyes: Conjunctivae normal and EOM are normal.  Neck: Normal range of motion. Neck supple.  Pulmonary/Chest: Effort normal.  Musculoskeletal: She exhibits tenderness.       In regards to the right lower extremity: Normal dorsiflexion and plantar flexion. Full range of motion of the ankle. Palpation of the gastrocnemius and anterior lower leg is tender. There is puffiness about the ankle and swelling with erythema extending approximately 1/2-2/3 up the right lower leg.The edema is not tense and the gastrocnemius is soft. No pitting edema. Left and right lower leg measures  40 cm in circumference.  Neurological: She is alert and oriented to person, place, and time.  Skin: Skin is warm and dry. There is erythema.  Psychiatric: She has a normal mood and affect.    ED Course  Procedures (including critical care time)   Labs Reviewed  D-DIMER, QUANTITATIVE  CBC WITH DIFFERENTIAL   No results found.   1. Bilateral cellulitis of lower leg       MDM   Results for orders placed during the hospital encounter of 04/02/12  D-DIMER, QUANTITATIVE      Component Value Range   D-Dimer, Quant 0.37  0.00 - 0.48 ug/mL-FEU  CBC WITH DIFFERENTIAL      Component Value Range   WBC 8.6  4.0 - 10.5 K/uL   RBC 4.37  3.87 - 5.11 MIL/uL   Hemoglobin 13.7  12.0 - 15.0 g/dL   HCT 16.1  09.6 - 04.5 %   MCV 90.6  78.0 - 100.0 fL   MCH 31.4  26.0 - 34.0 pg    MCHC 34.6  30.0 - 36.0 g/dL   RDW 40.9  81.1 - 91.4 %   Platelets 344  150 - 400 K/uL   Neutrophils Relative 56  43 - 77 %   Neutro Abs 4.8  1.7 - 7.7 K/uL   Lymphocytes Relative 34  12 - 46 %   Lymphs Abs 2.9  0.7 - 4.0 K/uL   Monocytes Relative 7  3 - 12 %   Monocytes Absolute 0.6  0.1 - 1.0 K/uL   Eosinophils Relative 3  0 - 5 %   Eosinophils Absolute 0.2  0.0 - 0.7 K/uL   Basophils Relative 0  0 - 1 %   Basophils Absolute 0.0  0.0 - 0.1 K/uL    Based on her history the most likely etiology for this could have been initiated by some sort of bug bite followed by a cellulitis. A risk for DVT is low in her d-dimer is negative. There is no history of trauma.And there is no break in the skin that I can see.  We will treat with a gram of Rocephin IM prior to discharge, Keflex 500 mg 4 times a day for 10 days. If there is no improvement in 2 days she is instructed to return for a wound check. In the meantime if there is any worsening such as ascending swelling, erythema or red streaks she is to return promptly.         Hayden Rasmussen, NP 04/02/12 1409

## 2012-04-02 NOTE — ED Provider Notes (Signed)
Medical screening examination/treatment/procedure(s) were performed by non-physician practitioner and as supervising physician I was immediately available for consultation/collaboration.  Leslee Home, M.D.   Reuben Likes, MD 04/02/12 2240

## 2012-09-18 ENCOUNTER — Other Ambulatory Visit (HOSPITAL_COMMUNITY): Payer: Self-pay | Admitting: Cardiology

## 2012-09-18 ENCOUNTER — Telehealth (HOSPITAL_COMMUNITY): Payer: Self-pay | Admitting: Cardiology

## 2012-09-18 DIAGNOSIS — R011 Cardiac murmur, unspecified: Secondary | ICD-10-CM

## 2012-09-18 NOTE — Telephone Encounter (Signed)
LEFT MESSAGE FOR PATIENT TO CALL BACK AND SCHEDULE TESTING THAT DR Herbie Baltimore ORDERED

## 2012-10-02 ENCOUNTER — Other Ambulatory Visit (HOSPITAL_COMMUNITY): Payer: Self-pay | Admitting: Cardiology

## 2012-10-02 NOTE — Telephone Encounter (Signed)
Rx was sent to pharmacy electronically. 

## 2012-11-08 ENCOUNTER — Inpatient Hospital Stay (HOSPITAL_COMMUNITY)
Admission: AD | Admit: 2012-11-08 | Discharge: 2012-11-09 | Disposition: A | Discharge status: Home / Self Care | Payer: Self-pay | Source: Ambulatory Visit | Attending: Obstetrics & Gynecology | Admitting: Obstetrics & Gynecology

## 2012-11-08 ENCOUNTER — Encounter (HOSPITAL_COMMUNITY): Payer: Self-pay

## 2012-11-08 ENCOUNTER — Inpatient Hospital Stay (HOSPITAL_COMMUNITY): Payer: Self-pay

## 2012-11-08 DIAGNOSIS — I10 Essential (primary) hypertension: Secondary | ICD-10-CM | POA: Insufficient documentation

## 2012-11-08 DIAGNOSIS — N940 Mittelschmerz: Secondary | ICD-10-CM | POA: Insufficient documentation

## 2012-11-08 DIAGNOSIS — R109 Unspecified abdominal pain: Secondary | ICD-10-CM | POA: Insufficient documentation

## 2012-11-08 LAB — URINALYSIS, ROUTINE W REFLEX MICROSCOPIC
Bilirubin Urine: NEGATIVE
Ketones, ur: NEGATIVE mg/dL
Protein, ur: NEGATIVE mg/dL
Urobilinogen, UA: 0.2 mg/dL (ref 0.0–1.0)

## 2012-11-08 LAB — POCT PREGNANCY, URINE: Preg Test, Ur: NEGATIVE

## 2012-11-08 LAB — URINE MICROSCOPIC-ADD ON

## 2012-11-08 LAB — WET PREP, GENITAL: Yeast Wet Prep HPF POC: NONE SEEN

## 2012-11-08 MED ORDER — KETOROLAC TROMETHAMINE 60 MG/2ML IM SOLN
60.0000 mg | Freq: Once | INTRAMUSCULAR | Status: AC
  Administered 2012-11-08: 60 mg via INTRAMUSCULAR
  Filled 2012-11-08: qty 2

## 2012-11-08 NOTE — MAU Note (Signed)
Pt states that lower abdomen started to hurt while at work. C/o of vaginal discharge. Denies vaginal bleeding

## 2012-11-08 NOTE — MAU Provider Note (Signed)
Chief Complaint: Abdominal Pain   First Provider Initiated Contact with Patient 11/08/12 2057     SUBJECTIVE HPI: [name] is a 37 y.o. G50P2002 female who presents with intermittent mid low abd pain since this after noon. No Hx similar pain. Rates pain 10 out of 10. Mild decrease in pain with ibuprofen 11 AM. No other aggravating or alleviating factors.Patient's last menstrual period was 10/26/2012. Regular, monthly cycles.  Past Medical History  Diagnosis Date  . Hypertension   . Trichomonal infection   . Syphilis    OB History  Gravida Para Term Preterm AB SAB TAB Ectopic Multiple Living  2 2 2       2     # Outcome Date GA Lbr Len/2nd Weight Sex Delivery Anes PTL Lv  2 TRM           1 TRM              Past Surgical History  Procedure Laterality Date  . Gallbladder surgery     History   Social History  . Marital Status: Single    Spouse Name: N/A    Number of Children: N/A  . Years of Education: N/A   Occupational History  . Not on file.   Social History Main Topics  . Smoking status: Current Every Day Smoker -- 0.50 packs/day    Types: Cigarettes  . Smokeless tobacco: Never Used  . Alcohol Use: Yes  . Drug Use: No  . Sexual Activity: Yes    Birth Control/ Protection: None   Other Topics Concern  . Not on file   Social History Narrative  . No narrative on file   No current facility-administered medications on file prior to encounter.   Current Outpatient Prescriptions on File Prior to Encounter  Medication Sig Dispense Refill  . diltiazem (DILACOR XR) 240 MG 24 hr capsule Take 240 mg by mouth daily.        Marland Kitchen ibuprofen (ADVIL,MOTRIN) 200 MG tablet Take 200 mg by mouth every 6 (six) hours as needed. Headache        No Known Allergies  ROS: Pertinent positive items in HPI. Denies fever, chills, dysuria, frequency, urgency, flank pain, nausea, vomiting, diarrhea, constipation, vaginal bleeding, dyspareunia. Last bowel movement  today.  OBJECTIVE Blood pressure 132/79, pulse 74, temperature 98.3 F (36.8 C), temperature source Oral, resp. rate 18, last menstrual period 10/26/2012, SpO2 100.00%. GENERAL: Well-developed, well-nourished female in mild distress. Wearing sunglasses. HEENT: Normocephalic HEART: normal rate RESP: normal effort ABDOMEN: Soft, moderate generalized low abdominal tenderness. Negative rebound. Negative mass. EXTREMITIES: Nontender, no edema NEURO: Alert and oriented SPECULUM EXAM: NEFG, moderate amount of clear, stretchy, odorless discharge consistent with ovulation, no blood noted, cervix clean BIMANUAL: cervix closed; uterus slightly enlarged, ~4 cm nontender right adnexal mass. No left adnexal mass or tenderness. No cervical motion tenderness.  LAB RESULTS Results for orders placed during the hospital encounter of 11/08/12 (from the past 24 hour(s))  URINALYSIS, ROUTINE W REFLEX MICROSCOPIC     Status: Abnormal   Collection Time    11/08/12  7:30 PM      Result Value Range   Color, Urine YELLOW  YELLOW   APPearance CLEAR  CLEAR   Specific Gravity, Urine 1.015  1.005 - 1.030   pH 7.0  5.0 - 8.0   Glucose, UA NEGATIVE  NEGATIVE mg/dL   Hgb urine dipstick SMALL (*) NEGATIVE   Bilirubin Urine NEGATIVE  NEGATIVE   Ketones, ur NEGATIVE  NEGATIVE  mg/dL   Protein, ur NEGATIVE  NEGATIVE mg/dL   Urobilinogen, UA 0.2  0.0 - 1.0 mg/dL   Nitrite NEGATIVE  NEGATIVE   Leukocytes, UA NEGATIVE  NEGATIVE  URINE MICROSCOPIC-ADD ON     Status: Abnormal   Collection Time    11/08/12  7:30 PM      Result Value Range   Squamous Epithelial / LPF FEW (*) RARE   WBC, UA 0-2  <3 WBC/hpf   RBC / HPF 3-6  <3 RBC/hpf   Bacteria, UA MANY (*) RARE   Urine-Other MUCOUS PRESENT    POCT PREGNANCY, URINE     Status: None   Collection Time    11/08/12  7:34 PM      Result Value Range   Preg Test, Ur NEGATIVE  NEGATIVE  WET PREP, GENITAL     Status: Abnormal   Collection Time    11/08/12  9:10 PM       Result Value Range   Yeast Wet Prep HPF POC NONE SEEN  NONE SEEN   Trich, Wet Prep NONE SEEN  NONE SEEN   Clue Cells Wet Prep HPF POC FEW (*) NONE SEEN   WBC, Wet Prep HPF POC FEW (*) NONE SEEN  CBC     Status: Abnormal   Collection Time    11/08/12 11:50 PM      Result Value Range   WBC 9.1  4.0 - 10.5 K/uL   RBC 3.97  3.87 - 5.11 MIL/uL   Hemoglobin 12.1  12.0 - 15.0 g/dL   HCT 40.9 (*) 81.1 - 91.4 %   MCV 89.4  78.0 - 100.0 fL   MCH 30.5  26.0 - 34.0 pg   MCHC 34.1  30.0 - 36.0 g/dL   RDW 78.2  95.6 - 21.3 %   Platelets 242  150 - 400 K/uL    IMAGING US Transvaginal Non-ob  11/08/2012   *RADIOLOGY REPORT*  Clinical Data: Pelvic pain  TRANSABDOMINAL AND TRANSVAGINAL ULTRASOUND OF PELVIS  Technique:  Both transabdominal and transvaginal ultrasound examinations of the pelvis were performed.  Transabdominal technique was performed for global imaging of the pelvis including uterus, ovaries, adnexal regions, and pelvic cul-de-sac.  It was necessary to proceed with endovaginal exam following the transabdominal exam to visualize the endometrium and ovaries.  Comparison:  11/02/2011  Findings: Uterus:  9.1 x 7.4 x 5.5 cm.  This is larger than previously, previously 7.9 x 5.9 x 4.4 cm.  Multiple uterine fibroids are re- identified.  Right posterior uterine body intramural fibroid producing mild mass effect upon the endometrial stripe measures 1.7 x 1.5 x 1.3 cm.  Right posterior uterine body intramural fibroid measures 1.3 x 1.3 x 0.9 cm.  Right fundal subserosal / intramural fibroid measures 3.4 x 3.4 x 3.0 cm, increased from 2.8 x 2.8 x 2.8 cm.  Endometrium: 1.4 cm.  Trilaminar in appearance.  Right ovary: Not identified.  No adnexal mass.  Left ovary: 4.7 x 2.5 x 2.4 cm.  Other Findings:  No free fluid.  IMPRESSION: Continued enlargement of the overall uterine size and multiple fibroids as above.  Two of these demonstrate at least partial submucosal component.   Original Report Authenticated By:  Christiana Pellant, M.D.   US Pelvis Complete  11/08/2012   *RADIOLOGY REPORT*  Clinical Data: Pelvic pain  TRANSABDOMINAL AND TRANSVAGINAL ULTRASOUND OF PELVIS  Technique:  Both transabdominal and transvaginal ultrasound examinations of the pelvis were performed.  Transabdominal technique was performed for global  imaging of the pelvis including uterus, ovaries, adnexal regions, and pelvic cul-de-sac.  It was necessary to proceed with endovaginal exam following the transabdominal exam to visualize the endometrium and ovaries.  Comparison:  11/02/2011  Findings: Uterus:  9.1 x 7.4 x 5.5 cm.  This is larger than previously, previously 7.9 x 5.9 x 4.4 cm.  Multiple uterine fibroids are re- identified.  Right posterior uterine body intramural fibroid producing mild mass effect upon the endometrial stripe measures 1.7 x 1.5 x 1.3 cm.  Right posterior uterine body intramural fibroid measures 1.3 x 1.3 x 0.9 cm.  Right fundal subserosal / intramural fibroid measures 3.4 x 3.4 x 3.0 cm, increased from 2.8 x 2.8 x 2.8 cm.  Endometrium: 1.4 cm.  Trilaminar in appearance.  Right ovary: Not identified.  No adnexal mass.  Left ovary: 4.7 x 2.5 x 2.4 cm.  Other Findings:  No free fluid.  IMPRESSION: Continued enlargement of the overall uterine size and multiple fibroids as above.  Two of these demonstrate at least partial submucosal component.   Original Report Authenticated By: Christiana Pellant, M.D.    MAU COURSE Pain 8/10 with Toradol. Pt sleeping. Offered additional pain meds, but pt has to drive herself. Will Rx Tramadol for breakthrough pain.   ASSESSMENT 1. Abdominal pain in female patient   2. Mittelschmerz phenomenon     PLAN Discharge home in stable condition per consult with Dr. Clearance Coots. Comfort measures. Did not take Toradol within 6 hours of ibuprofen. GC/CT pending.     Follow-up Information   Follow up with Roseanna Rainbow, MD. (As needed if symptoms worsen)    Specialty:  Obstetrics and  Gynecology   Contact information:   [address] [place] [address] [phone]      followup at [place] if symptoms worsen.    Medication List    STOP taking these medications       cephALEXin 500 MG capsule  Commonly known as:  KEFLEX     diltiazem 240 MG 24 hr capsule  Commonly known as:  CARDIZEM CD      TAKE these medications       diltiazem 240 MG 24 hr capsule  Commonly known as:  DILACOR XR  Take 240 mg by mouth daily.     ibuprofen 200 MG tablet  Commonly known as:  ADVIL,MOTRIN  Take 200 mg by mouth every 6 (six) hours as needed. Headache     ketorolac 10 MG tablet  Commonly known as:  TORADOL  Take 1 tablet (10 mg total) by mouth every 6 (six) hours as needed for pain.     traMADol 50 MG tablet  Commonly known as:  ULTRAM  Take 1-2 tablets (50-100 mg total) by mouth every 6 (six) hours as needed for pain.       [place], [place] 11/09/2012  12:35 AM

## 2012-11-09 DIAGNOSIS — R109 Unspecified abdominal pain: Secondary | ICD-10-CM

## 2012-11-09 LAB — GC/CHLAMYDIA PROBE AMP
CT Probe RNA: NEGATIVE
GC Probe RNA: NEGATIVE

## 2012-11-09 LAB — CBC
MCH: 30.5 pg (ref 26.0–34.0)
MCV: 89.4 fL (ref 78.0–100.0)
Platelets: 242 10*3/uL (ref 150–400)
RDW: 13 % (ref 11.5–15.5)
WBC: 9.1 10*3/uL (ref 4.0–10.5)

## 2012-11-09 MED ORDER — KETOROLAC TROMETHAMINE 10 MG PO TABS: 10.0000 mg | ORAL_TABLET | Freq: Four times a day (QID) | ORAL | Status: DC | PRN

## 2012-11-09 MED ORDER — TRAMADOL HCL 50 MG PO TABS: 50.0000 mg | ORAL_TABLET | Freq: Four times a day (QID) | ORAL | Status: DC | PRN

## 2012-11-30 ENCOUNTER — Telehealth (HOSPITAL_COMMUNITY): Payer: Self-pay | Admitting: *Deleted

## 2012-11-30 NOTE — Telephone Encounter (Signed)
  [facility] AT [facility] [address] [place], [place] [address] [phone]   Date:11/30/2012  Dear Dr. Herbie Baltimore  Our office has attempted to contact your patient [name]  twice by telephone and we have also sent an appointment letter to schedule the Echocardiogram test you ordered. The patient has not responded. We will not make any further attempts to contact the patient. If any further assistance is needed for this referral, please contact our office at [phone]  Sincerely, [facility] Scheduling Team

## 2013-04-22 ENCOUNTER — Encounter: Payer: Self-pay | Admitting: Obstetrics

## 2013-04-22 ENCOUNTER — Ambulatory Visit (INDEPENDENT_AMBULATORY_CARE_PROVIDER_SITE_OTHER): Payer: BC Managed Care – PPO | Admitting: Obstetrics

## 2013-04-22 VITALS — BP 175/99 | HR 83 | Temp 97.7°F | Ht 60.0 in | Wt 162.0 lb

## 2013-04-22 DIAGNOSIS — Z01419 Encounter for gynecological examination (general) (routine) without abnormal findings: Secondary | ICD-10-CM

## 2013-04-22 DIAGNOSIS — D259 Leiomyoma of uterus, unspecified: Secondary | ICD-10-CM

## 2013-04-22 LAB — WET PREP BY MOLECULAR PROBE
CANDIDA SPECIES: NEGATIVE
Gardnerella vaginalis: POSITIVE — AB
TRICHOMONAS VAG: NEGATIVE

## 2013-04-22 MED ORDER — AMOXICILLIN-POT CLAVULANATE 875-125 MG PO TABS: 1.0000 | ORAL_TABLET | Freq: Two times a day (BID) | ORAL | Status: DC

## 2013-04-22 NOTE — Progress Notes (Signed)
Subjective:     [name] is a 38 y.o. female here for a routine exam.  Current complaints: Here for Annual exam. Patient would like to talk about the boil on her inner right thigh. Patient states she is also having fibroid pain.Personal health questionnaire reviewed: yes.   Gynecologic History Patient's last menstrual period was 04/08/2013. Contraception: condoms Last Pap: 2011 Results were: normal Last mammogram: 2013. Results were: Normal.  Obstetric History OB History  Gravida Para Term Preterm AB SAB TAB Ectopic Multiple Living  2 2 2       2     # Outcome Date GA Lbr Len/2nd Weight Sex Delivery Anes PTL Lv  2 TRM 08/24/04 [redacted]w[redacted]d  7 lb 12 oz (3.515 kg) F SVD EPI  Y  1 TRM 03/30/96 [redacted]w[redacted]d  6 lb 15 oz (3.147 kg) M SVD None  Y       The following portions of the patient's history were reviewed and updated as appropriate: allergies, current medications, past family history, past medical history, past social history, past surgical history and problem list.  Review of Systems Pertinent items are noted in HPI.    Objective:    General appearance: alert and no distress Abdomen: normal findings: soft, non-tender Pelvic: cervix normal in appearance, external genitalia normal, no adnexal masses or tenderness, no cervical motion tenderness, rectovaginal septum normal, vagina normal without discharge and uterus enlarged, NT.    Assessment:    Healthy female exam.   Symptomatic uterine fibroids   Plan:    Education reviewed: management of uterine fibroids. Follow up in: 1 year. Patient considering surgical and other options for management of fibroids.

## 2013-04-23 LAB — GC/CHLAMYDIA PROBE AMP
CT PROBE, AMP APTIMA: NEGATIVE
GC PROBE AMP APTIMA: NEGATIVE

## 2013-04-23 LAB — PAP IG W/ RFLX HPV ASCU

## 2013-04-25 ENCOUNTER — Encounter: Payer: Self-pay | Admitting: Obstetrics

## 2013-04-25 DIAGNOSIS — D259 Leiomyoma of uterus, unspecified: Secondary | ICD-10-CM | POA: Insufficient documentation

## 2013-05-07 ENCOUNTER — Other Ambulatory Visit: Payer: Self-pay | Admitting: Obstetrics

## 2013-05-07 ENCOUNTER — Ambulatory Visit (INDEPENDENT_AMBULATORY_CARE_PROVIDER_SITE_OTHER): Payer: BC Managed Care – PPO | Admitting: Obstetrics

## 2013-05-07 ENCOUNTER — Encounter: Payer: Self-pay | Admitting: Obstetrics

## 2013-05-07 VITALS — BP 178/108 | HR 79 | Temp 98.3°F | Wt 161.0 lb

## 2013-05-07 DIAGNOSIS — N920 Excessive and frequent menstruation with regular cycle: Secondary | ICD-10-CM

## 2013-05-07 DIAGNOSIS — N939 Abnormal uterine and vaginal bleeding, unspecified: Secondary | ICD-10-CM

## 2013-05-07 DIAGNOSIS — A499 Bacterial infection, unspecified: Secondary | ICD-10-CM

## 2013-05-07 DIAGNOSIS — N760 Acute vaginitis: Secondary | ICD-10-CM

## 2013-05-07 DIAGNOSIS — B373 Candidiasis of vulva and vagina: Secondary | ICD-10-CM | POA: Insufficient documentation

## 2013-05-07 DIAGNOSIS — N926 Irregular menstruation, unspecified: Secondary | ICD-10-CM | POA: Insufficient documentation

## 2013-05-07 DIAGNOSIS — D259 Leiomyoma of uterus, unspecified: Secondary | ICD-10-CM

## 2013-05-07 DIAGNOSIS — B9689 Other specified bacterial agents as the cause of diseases classified elsewhere: Secondary | ICD-10-CM

## 2013-05-07 DIAGNOSIS — N946 Dysmenorrhea, unspecified: Secondary | ICD-10-CM

## 2013-05-07 DIAGNOSIS — B3731 Acute candidiasis of vulva and vagina: Secondary | ICD-10-CM | POA: Insufficient documentation

## 2013-05-07 MED ORDER — FLUCONAZOLE 150 MG PO TABS: 150.0000 mg | ORAL_TABLET | Freq: Once | ORAL | Status: DC

## 2013-05-07 MED ORDER — IBUPROFEN 800 MG PO TABS: 800.0000 mg | ORAL_TABLET | Freq: Three times a day (TID) | ORAL | Status: DC | PRN

## 2013-05-07 MED ORDER — METRONIDAZOLE 500 MG PO TABS: 500.0000 mg | ORAL_TABLET | Freq: Two times a day (BID) | ORAL | Status: DC

## 2013-05-07 NOTE — Progress Notes (Signed)
Subjective:     [name] is a 38 y.o. female here for a follow up exam.  Current complaints: pt is in office to discuss fibroid management options.  Pt has increased BP in office today.  Pt states that she takes her medication at night.  Pt advised to follow up with cardiologist for BP management.  Personal health questionnaire reviewed: yes.   Gynecologic History Patient's last menstrual period was 04/08/2013.    Obstetric History OB History  Gravida Para Term Preterm AB SAB TAB Ectopic Multiple Living  2 2 2       2     # Outcome Date GA Lbr Len/2nd Weight Sex Delivery Anes PTL Lv  2 TRM 08/24/04 [redacted]w[redacted]d  7 lb 12 oz (3.515 kg) F SVD EPI  Y  1 TRM 03/30/96 [redacted]w[redacted]d  6 lb 15 oz (3.147 kg) M SVD None  Y       The following portions of the patient's history were reviewed and updated as appropriate: allergies, current medications, past family history, past medical history, past social history, past surgical history and problem list.  Review of Systems Pertinent items are noted in HPI.    Objective:    No exam performed today, Consult only.    Assessment:    Symptomatic uterine fibroids.  Interested in Uterine Fibroid Embolization ( UFE )   Plan:    Education reviewed: Management of uterine fibroids.  UFE as option.. Contraception: tubal ligation. Referred to Interventional Radiology for consultation for UFE.

## 2013-05-08 ENCOUNTER — Telehealth: Payer: Self-pay | Admitting: Cardiology

## 2013-05-08 NOTE — Telephone Encounter (Signed)
Blood pressure is up 167/99 today and yesterday was 170/114. Have been feeling dizzy and room spinning .Marland KitchenMarland KitchenPlease Call  Thanks

## 2013-05-08 NOTE — Telephone Encounter (Signed)
Spoke to wife. She states blood pressure elevated . RN suggest go to see PCP. Patient states does not have one ,suggest to go to an urgent care.patient states she   may go to Urgent Care Offered an appt for next week. She agreed to appointment 05/15/2013 2 pm.

## 2013-05-15 ENCOUNTER — Ambulatory Visit: Payer: BC Managed Care – PPO | Admitting: Cardiology

## 2013-05-16 ENCOUNTER — Inpatient Hospital Stay: Admission: RE | Admit: 2013-05-16 | Payer: BC Managed Care – PPO | Source: Ambulatory Visit

## 2013-05-28 ENCOUNTER — Ambulatory Visit
Admission: RE | Admit: 2013-05-28 | Discharge: 2013-05-28 | Disposition: A | Discharge status: Home / Self Care | Payer: BC Managed Care – PPO | Source: Ambulatory Visit | Attending: Obstetrics | Admitting: Obstetrics

## 2013-05-28 DIAGNOSIS — D259 Leiomyoma of uterus, unspecified: Secondary | ICD-10-CM

## 2013-05-28 DIAGNOSIS — N920 Excessive and frequent menstruation with regular cycle: Secondary | ICD-10-CM

## 2013-05-28 HISTORY — DX: Benign neoplasm of connective and other soft tissue, unspecified: D21.9

## 2013-05-30 ENCOUNTER — Other Ambulatory Visit: Payer: Self-pay | Admitting: Obstetrics

## 2013-05-30 ENCOUNTER — Encounter: Payer: Self-pay | Admitting: Obstetrics

## 2013-05-30 ENCOUNTER — Other Ambulatory Visit: Payer: Self-pay | Admitting: Emergency Medicine

## 2013-05-30 DIAGNOSIS — N920 Excessive and frequent menstruation with regular cycle: Secondary | ICD-10-CM

## 2013-05-30 DIAGNOSIS — D259 Leiomyoma of uterus, unspecified: Secondary | ICD-10-CM

## 2013-05-30 DIAGNOSIS — I10 Essential (primary) hypertension: Secondary | ICD-10-CM

## 2013-06-02 ENCOUNTER — Other Ambulatory Visit: Payer: BC Managed Care – PPO

## 2013-06-08 ENCOUNTER — Inpatient Hospital Stay: Admission: RE | Admit: 2013-06-08 | Payer: BC Managed Care – PPO | Source: Ambulatory Visit

## 2013-08-02 ENCOUNTER — Ambulatory Visit (INDEPENDENT_AMBULATORY_CARE_PROVIDER_SITE_OTHER): Payer: BC Managed Care – PPO | Admitting: Obstetrics & Gynecology

## 2013-08-02 ENCOUNTER — Encounter: Payer: Self-pay | Admitting: Obstetrics & Gynecology

## 2013-08-02 VITALS — BP 162/102 | HR 90 | Temp 98.7°F | Ht 60.0 in | Wt 158.0 lb

## 2013-08-02 DIAGNOSIS — R3989 Other symptoms and signs involving the genitourinary system: Secondary | ICD-10-CM

## 2013-08-02 DIAGNOSIS — R399 Unspecified symptoms and signs involving the genitourinary system: Secondary | ICD-10-CM

## 2013-08-02 DIAGNOSIS — N898 Other specified noninflammatory disorders of vagina: Secondary | ICD-10-CM

## 2013-08-02 LAB — POCT URINALYSIS DIPSTICK
BILIRUBIN UA: NEGATIVE
GLUCOSE UA: NEGATIVE
Ketones, UA: NEGATIVE
NITRITE UA: NEGATIVE
Spec Grav, UA: <=1.005
UROBILINOGEN UA: NEGATIVE
pH, UA: 6.5

## 2013-08-02 MED ORDER — SULFAMETHOXAZOLE-TMP DS 800-160 MG PO TABS: 1.0000 | ORAL_TABLET | Freq: Two times a day (BID) | ORAL | Status: DC

## 2013-08-02 NOTE — Progress Notes (Signed)
Patient ID: [name], female   DOB: 09/27/1975, 38 y.o.   MRN: [id number]  Chief Complaint  Patient presents with  . Urinary frequency, suprapubic pain        HPI [name] is a 38 y.o. female. Urinary Tract Infection  The current episode started yesterday. The problem has been unchanged. The quality of the pain is described as aching. There has been no fever. She is sexually active. There is no history of pyelonephritis. Associated symptoms include frequency and hesitancy. Pertinent negatives include no urgency. She has tried nothing for the symptoms.    Past Medical History  Diagnosis Date  . Hypertension   . Trichomonal infection   . Syphilis   . Fibroids 2012    Past Surgical History  Procedure Laterality Date  . Gallbladder surgery    . Eye surgery    . Cholecystectomy      Family History  Problem Relation Age of Onset  . Hypertension Father   . Early death Father     Social History History  Substance Use Topics  . Smoking status: Current Every Day Smoker -- 0.25 packs/day    Types: Cigarettes    Start date: 05/29/2003  . Smokeless tobacco: Never Used  . Alcohol Use: 1.0 oz/week    2 drink(s) per week     Comment: Occasionaly     No Known Allergies  Current Outpatient Prescriptions  Medication Sig Dispense Refill  . diltiazem (DILACOR XR) 240 MG 24 hr capsule Take 240 mg by mouth daily.        Marland Kitchen ibuprofen (ADVIL,MOTRIN) 800 MG tablet Take 1 tablet (800 mg total) by mouth every 8 (eight) hours as needed.  30 tablet  5   No current facility-administered medications for this visit.    Review of Systems Review of Systems  Genitourinary: Positive for hesitancy and frequency. Negative for urgency.  Constitutional: negative for fatigue and weight loss Respiratory: negative for cough and wheezing Cardiovascular: negative for chest pain, fatigue and palpitations Gastrointestinal: negative for abdominal pain and change in bowel  habits Integument/breast: negative for nipple discharge Musculoskeletal:negative for myalgias Neurological: negative for gait problems and tremors Behavioral/Psych: negative for abusive relationship, depression Endocrine: negative for temperature intolerance     Blood pressure 162/102, pulse 90, temperature 98.7 F (37.1 C), height 5' (1.524 m), weight 71.668 kg (158 lb), last menstrual period 07/22/2013.  Physical Exam Physical Exam General:   alert  Skin:   no rash or abnormalities  Lungs:   clear to auscultation bilaterally  Heart:   regular rate and rhythm, S1, S2 normal, no murmur, click, rub or gallop  Breasts:   normal without suspicious masses, skin or nipple changes or axillary nodes  Abdomen:  normal findings: no organomegaly, soft, non-tender and no hernia  Pelvis:  External genitalia: normal general appearance Urinary system: urethral meatus normal and bladder without fullness, nontender Vaginal: normal without tenderness, induration or masses Cervix: normal appearance Adnexa: normal bimanual exam Uterus: anteverted and non-tender, normal size--irregular contour       Data Reviewed None  Assessment    UTI Requests IUD for contraception     Plan   Wet prep; GC/CT Bactrim DS/Urobel samples Return for possible Mirena IUD insertion Follow up as needed.         Lahoma Crocker 08/02/2013, 1:56 PM

## 2013-08-02 NOTE — Patient Instructions (Signed)
Levonorgestrel intrauterine device (IUD) What is this medicine? LEVONORGESTREL IUD (LEE voe nor jes trel) is a contraceptive (birth control) device. The device is placed inside the uterus by a healthcare professional. It is used to prevent pregnancy and can also be used to treat heavy bleeding that occurs during your period. Depending on the device, it can be used for 3 to 5 years. This medicine may be used for other purposes; ask your health care provider or pharmacist if you have questions. COMMON BRAND NAME(S): Mirena, Skyla What should I tell my health care provider before I take this medicine? They need to know if you have any of these conditions: -abnormal Pap smear -cancer of the breast, uterus, or cervix -diabetes -endometritis -genital or pelvic infection now or in the past -have more than one sexual partner or your partner has more than one partner -heart disease -history of an ectopic or tubal pregnancy -immune system problems -IUD in place -liver disease or tumor -problems with blood clots or take blood-thinners -use intravenous drugs -uterus of unusual shape -vaginal bleeding that has not been explained -an unusual or allergic reaction to levonorgestrel, other hormones, silicone, or polyethylene, medicines, foods, dyes, or preservatives -pregnant or trying to get pregnant -breast-feeding How should I use this medicine? This device is placed inside the uterus by a health care professional. Talk to your pediatrician regarding the use of this medicine in children. Special care may be needed. Overdosage: If you think you have taken too much of this medicine contact a poison control center or emergency room at once. NOTE: This medicine is only for you. Do not share this medicine with others. What if I miss a dose? This does not apply. What may interact with this medicine? Do not take this medicine with any of the following  medications: -amprenavir -bosentan -fosamprenavir This medicine may also interact with the following medications: -aprepitant -barbiturate medicines for inducing sleep or treating seizures -bexarotene -griseofulvin -medicines to treat seizures like carbamazepine, ethotoin, felbamate, oxcarbazepine, phenytoin, topiramate -modafinil -pioglitazone -rifabutin -rifampin -rifapentine -some medicines to treat HIV infection like atazanavir, indinavir, lopinavir, nelfinavir, tipranavir, ritonavir -St. John's wort -warfarin This list may not describe all possible interactions. Give your health care provider a list of all the medicines, herbs, non-prescription drugs, or dietary supplements you use. Also tell them if you smoke, drink alcohol, or use illegal drugs. Some items may interact with your medicine. What should I watch for while using this medicine? Visit your doctor or health care professional for regular check ups. See your doctor if you or your partner has sexual contact with others, becomes HIV positive, or gets a sexual transmitted disease. This product does not protect you against HIV infection (AIDS) or other sexually transmitted diseases. You can check the placement of the IUD yourself by reaching up to the top of your vagina with clean fingers to feel the threads. Do not pull on the threads. It is a good habit to check placement after each menstrual period. Call your doctor right away if you feel more of the IUD than just the threads or if you cannot feel the threads at all. The IUD may come out by itself. You may become pregnant if the device comes out. If you notice that the IUD has come out use a backup birth control method like condoms and call your health care provider. Using tampons will not change the position of the IUD and are okay to use during your period. What side effects may I   notice from receiving this medicine? Side effects that you should report to your doctor or  health care professional as soon as possible: -allergic reactions like skin rash, itching or hives, swelling of the face, lips, or tongue -fever, flu-like symptoms -genital sores -high blood pressure -no menstrual period for 6 weeks during use -pain, swelling, warmth in the leg -pelvic pain or tenderness -severe or sudden headache -signs of pregnancy -stomach cramping -sudden shortness of breath -trouble with balance, talking, or walking -unusual vaginal bleeding, discharge -yellowing of the eyes or skin Side effects that usually do not require medical attention (report to your doctor or health care professional if they continue or are bothersome): -acne -breast pain -change in sex drive or performance -changes in weight -cramping, dizziness, or faintness while the device is being inserted -headache -irregular menstrual bleeding within first 3 to 6 months of use -nausea This list may not describe all possible side effects. Call your doctor for medical advice about side effects. You may report side effects to FDA at 1-800-FDA-1088. Where should I keep my medicine? This does not apply. NOTE: This sheet is a summary. It may not cover all possible information. If you have questions about this medicine, talk to your doctor, pharmacist, or health care provider.  2014, Elsevier/Gold Standard. (2011-04-14 13:54:04) Urinary Tract Infection Urinary tract infections (UTIs) can develop anywhere along your urinary tract. Your urinary tract is your body's drainage system for removing wastes and extra water. Your urinary tract includes two kidneys, two ureters, a bladder, and a urethra. Your kidneys are a pair of bean-shaped organs. Each kidney is about the size of your fist. They are located below your ribs, one on each side of your spine. CAUSES Infections are caused by microbes, which are microscopic organisms, including fungi, viruses, and bacteria. These organisms are so small that they can  only be seen through a microscope. Bacteria are the microbes that most commonly cause UTIs. SYMPTOMS  Symptoms of UTIs may vary by age and gender of the patient and by the location of the infection. Symptoms in young women typically include a frequent and intense urge to urinate and a painful, burning feeling in the bladder or urethra during urination. Older women and men are more likely to be tired, shaky, and weak and have muscle aches and abdominal pain. A fever may mean the infection is in your kidneys. Other symptoms of a kidney infection include pain in your back or sides below the ribs, nausea, and vomiting. DIAGNOSIS To diagnose a UTI, your caregiver will ask you about your symptoms. Your caregiver also will ask to provide a urine sample. The urine sample will be tested for bacteria and white blood cells. White blood cells are made by your body to help fight infection. TREATMENT  Typically, UTIs can be treated with medication. Because most UTIs are caused by a bacterial infection, they usually can be treated with the use of antibiotics. The choice of antibiotic and length of treatment depend on your symptoms and the type of bacteria causing your infection. HOME CARE INSTRUCTIONS  If you were prescribed antibiotics, take them exactly as your caregiver instructs you. Finish the medication even if you feel better after you have only taken some of the medication.  Drink enough water and fluids to keep your urine clear or pale yellow.  Avoid caffeine, tea, and carbonated beverages. They tend to irritate your bladder.  Empty your bladder often. Avoid holding urine for long periods of time.  Empty your  bladder before and after sexual intercourse.  After a bowel movement, women should cleanse from front to back. Use each tissue only once. SEEK MEDICAL CARE IF:   You have back pain.  You develop a fever.  Your symptoms do not begin to resolve within 3 days. SEEK IMMEDIATE MEDICAL CARE IF:    You have severe back pain or lower abdominal pain.  You develop chills.  You have nausea or vomiting.  You have continued burning or discomfort with urination. MAKE SURE YOU:   Understand these instructions.  Will watch your condition.  Will get help right away if you are not doing well or get worse. Document Released: 12/22/2004 Document Revised: 09/13/2011 Document Reviewed: 04/22/2011 [facility] Patient Information 2014 [place].

## 2013-08-03 LAB — WET PREP BY MOLECULAR PROBE
CANDIDA SPECIES: NEGATIVE
Gardnerella vaginalis: POSITIVE — AB
Trichomonas vaginosis: NEGATIVE

## 2013-08-03 LAB — GC/CHLAMYDIA PROBE AMP
CT PROBE, AMP APTIMA: NEGATIVE
GC PROBE AMP APTIMA: NEGATIVE

## 2013-08-20 ENCOUNTER — Other Ambulatory Visit: Payer: Self-pay | Admitting: *Deleted

## 2013-08-20 DIAGNOSIS — B9689 Other specified bacterial agents as the cause of diseases classified elsewhere: Secondary | ICD-10-CM

## 2013-08-20 DIAGNOSIS — N760 Acute vaginitis: Principal | ICD-10-CM

## 2013-08-20 MED ORDER — METRONIDAZOLE 500 MG PO TABS: 500.0000 mg | ORAL_TABLET | Freq: Two times a day (BID) | ORAL | Status: DC

## 2013-10-02 ENCOUNTER — Other Ambulatory Visit (HOSPITAL_COMMUNITY): Payer: Self-pay | Admitting: Cardiology

## 2013-10-03 NOTE — Telephone Encounter (Signed)
Rx refill sent to patient pharmacy   

## 2013-11-04 ENCOUNTER — Telehealth: Payer: Self-pay | Admitting: Cardiology

## 2013-11-04 ENCOUNTER — Other Ambulatory Visit (HOSPITAL_COMMUNITY): Payer: Self-pay | Admitting: Cardiology

## 2013-11-04 NOTE — Telephone Encounter (Signed)
Rx refill sent to patient pharmacy   

## 2013-11-04 NOTE — Telephone Encounter (Signed)
closed encounter

## 2013-11-05 ENCOUNTER — Telehealth: Payer: Self-pay | Admitting: Cardiology

## 2013-11-05 ENCOUNTER — Other Ambulatory Visit: Payer: Self-pay | Admitting: *Deleted

## 2013-11-05 NOTE — Telephone Encounter (Signed)
Spoke to patient . Rx is ready to be picked up Patient is aware. 1 more refill may be needed prior to office appointment OCT 2015

## 2013-11-07 NOTE — Telephone Encounter (Signed)
Closed encounter °

## 2013-11-08 ENCOUNTER — Other Ambulatory Visit: Payer: Self-pay | Admitting: Obstetrics

## 2013-11-08 ENCOUNTER — Ambulatory Visit (HOSPITAL_COMMUNITY)
Admission: RE | Admit: 2013-11-08 | Discharge: 2013-11-08 | Disposition: A | Discharge status: Home / Self Care | Payer: BC Managed Care – PPO | Source: Ambulatory Visit | Attending: Obstetrics | Admitting: Obstetrics

## 2013-11-08 DIAGNOSIS — Z1231 Encounter for screening mammogram for malignant neoplasm of breast: Secondary | ICD-10-CM

## 2013-11-08 DIAGNOSIS — Z01419 Encounter for gynecological examination (general) (routine) without abnormal findings: Secondary | ICD-10-CM

## 2013-12-17 ENCOUNTER — Ambulatory Visit (INDEPENDENT_AMBULATORY_CARE_PROVIDER_SITE_OTHER): Payer: BC Managed Care – PPO | Admitting: Obstetrics

## 2013-12-17 VITALS — BP 160/102 | HR 69 | Temp 98.5°F | Wt 163.0 lb

## 2013-12-17 DIAGNOSIS — B9689 Other specified bacterial agents as the cause of diseases classified elsewhere: Secondary | ICD-10-CM

## 2013-12-17 DIAGNOSIS — A499 Bacterial infection, unspecified: Secondary | ICD-10-CM

## 2013-12-17 DIAGNOSIS — N760 Acute vaginitis: Secondary | ICD-10-CM

## 2013-12-17 DIAGNOSIS — N898 Other specified noninflammatory disorders of vagina: Secondary | ICD-10-CM

## 2013-12-17 LAB — POCT URINALYSIS DIPSTICK
Bilirubin, UA: NEGATIVE
GLUCOSE UA: NEGATIVE
Ketones, UA: NEGATIVE
Leukocytes, UA: NEGATIVE
NITRITE UA: NEGATIVE
PH UA: 6
PROTEIN UA: NEGATIVE
RBC UA: NEGATIVE
SPEC GRAV UA: 1.015
UROBILINOGEN UA: NEGATIVE

## 2013-12-17 LAB — POCT URINE PREGNANCY: Preg Test, Ur: NEGATIVE

## 2013-12-17 MED ORDER — METRONIDAZOLE 500 MG PO TABS: 500.0000 mg | ORAL_TABLET | Freq: Two times a day (BID) | ORAL | Status: DC

## 2013-12-17 NOTE — Progress Notes (Signed)
Patient ID: [name], female   DOB: Apr 02, 1975, 38 y.o.   MRN: [id number]  Chief Complaint  Patient presents with  . Vaginitis    HPI [name] is a 38 y.o. female.  Malodorous vaginal discharge.  HPI  Past Medical History  Diagnosis Date  . Hypertension   . Trichomonal infection   . Syphilis   . Fibroids 2012    Past Surgical History  Procedure Laterality Date  . Gallbladder surgery    . Eye surgery    . Cholecystectomy      Family History  Problem Relation Age of Onset  . Hypertension Father   . Early death Father     Social History History  Substance Use Topics  . Smoking status: Current Every Day Smoker -- 0.25 packs/day    Types: Cigarettes    Start date: 05/29/2003  . Smokeless tobacco: Never Used  . Alcohol Use: 1.0 oz/week    2 drink(s) per week     Comment: Occasionaly     No Known Allergies  Current Outpatient Prescriptions  Medication Sig Dispense Refill  . diltiazem (DILACOR XR) 240 MG 24 hr capsule Take 240 mg by mouth daily.        Marland Kitchen ibuprofen (ADVIL,MOTRIN) 800 MG tablet Take 1 tablet (800 mg total) by mouth every 8 (eight) hours as needed.  30 tablet  5  . diltiazem (CARTIA XT) 240 MG 24 hr capsule Take 1 capsule (240 mg total) by mouth daily.  30 capsule  1  . metroNIDAZOLE (FLAGYL) 500 MG tablet Take 1 tablet (500 mg total) by mouth 2 (two) times daily.  14 tablet  2  . sulfamethoxazole-trimethoprim (BACTRIM DS) 800-160 MG per tablet Take 1 tablet by mouth 2 (two) times daily.  6 tablet  0   No current facility-administered medications for this visit.    Review of Systems Review of Systems Constitutional: negative for fatigue and weight loss Respiratory: negative for cough and wheezing Cardiovascular: negative for chest pain, fatigue and palpitations Gastrointestinal: negative for abdominal pain and change in bowel habits Genitourinary: positive for malodorous vaginal discharge Integument/breast: negative for nipple  discharge Musculoskeletal:negative for myalgias Neurological: negative for gait problems and tremors Behavioral/Psych: negative for abusive relationship, depression Endocrine: negative for temperature intolerance     Blood pressure 160/102, pulse 69, temperature 98.5 F (36.9 C), weight 163 lb (73.936 kg), last menstrual period 11/28/2013.  Physical Exam Physical Exam General:   alert  Skin:   no rash or abnormalities  Lungs:   clear to auscultation bilaterally  Heart:   regular rate and rhythm, S1, S2 normal, no murmur, click, rub or gallop  Breasts:   normal without suspicious masses, skin or nipple changes or axillary nodes  Abdomen:  normal findings: no organomegaly, soft, non-tender and no hernia  Pelvis:  External genitalia: normal general appearance Urinary system: urethral meatus normal and bladder without fullness, nontender Vaginal: vaginal discharge, malodorous Cervix: normal appearance Adnexa: normal bimanual exam Uterus: anteverted and non-tender, normal size      Data Reviewed Labs  Assessment    BV     Plan    Flagyl Rx.   Discussed prevention and management of BV. F/U for annual and pap smear.    Orders Placed This Encounter  Procedures  . WET PREP BY MOLECULAR PROBE  . GC/Chlamydia Probe Amp  . POCT urinalysis dipstick  . POCT urine pregnancy   Meds ordered this encounter  Medications  . metroNIDAZOLE (FLAGYL) 500  MG tablet    Sig: Take 1 tablet (500 mg total) by mouth 2 (two) times daily.    Dispense:  14 tablet    Refill:  2         HARPER,CHARLES A 12/17/2013, 4:58 PM

## 2013-12-18 ENCOUNTER — Other Ambulatory Visit: Payer: Self-pay | Admitting: Obstetrics

## 2013-12-18 LAB — WET PREP BY MOLECULAR PROBE
CANDIDA SPECIES: NEGATIVE
GARDNERELLA VAGINALIS: POSITIVE — AB
TRICHOMONAS VAG: NEGATIVE

## 2013-12-26 ENCOUNTER — Ambulatory Visit (INDEPENDENT_AMBULATORY_CARE_PROVIDER_SITE_OTHER): Payer: BC Managed Care – PPO | Admitting: Cardiology

## 2013-12-26 ENCOUNTER — Encounter: Payer: Self-pay | Admitting: Cardiology

## 2013-12-26 VITALS — BP 142/88 | HR 81 | Ht 60.0 in | Wt 162.2 lb

## 2013-12-26 DIAGNOSIS — I10 Essential (primary) hypertension: Secondary | ICD-10-CM

## 2013-12-26 DIAGNOSIS — E669 Obesity, unspecified: Secondary | ICD-10-CM

## 2013-12-26 DIAGNOSIS — Z79899 Other long term (current) drug therapy: Secondary | ICD-10-CM

## 2013-12-26 DIAGNOSIS — E785 Hyperlipidemia, unspecified: Secondary | ICD-10-CM

## 2013-12-26 DIAGNOSIS — R002 Palpitations: Secondary | ICD-10-CM

## 2013-12-26 MED ORDER — HYDROCHLOROTHIAZIDE 12.5 MG PO CAPS: 12.5000 mg | ORAL_CAPSULE | Freq: Every day | ORAL | Status: DC

## 2013-12-26 MED ORDER — HYDROCHLOROTHIAZIDE 25 MG PO TABS: 25.0000 mg | ORAL_TABLET | Freq: Every day | ORAL | Status: DC

## 2013-12-26 MED ORDER — DILTIAZEM HCL ER COATED BEADS 240 MG PO CP24: 240.0000 mg | ORAL_CAPSULE | Freq: Every day | ORAL | Status: DC

## 2013-12-26 NOTE — Patient Instructions (Addendum)
Labs CMP, LIPID-FASTING- 1 month  START HCTZ 25 MG TAKE ONE TABLET DAILY  Your physician recommends that you schedule a follow-up appointment in 2 months [place] physician wants you to follow-up in [place].  You will receive a reminder letter in the mail two months in advance. If you don't receive a letter, please call our office to schedule the follow-up appointment.

## 2013-12-29 ENCOUNTER — Encounter: Payer: Self-pay | Admitting: Cardiology

## 2013-12-29 DIAGNOSIS — R002 Palpitations: Secondary | ICD-10-CM | POA: Insufficient documentation

## 2013-12-29 DIAGNOSIS — E785 Hyperlipidemia, unspecified: Secondary | ICD-10-CM | POA: Insufficient documentation

## 2013-12-29 DIAGNOSIS — E669 Obesity, unspecified: Secondary | ICD-10-CM | POA: Insufficient documentation

## 2013-12-29 DIAGNOSIS — I10 Essential (primary) hypertension: Secondary | ICD-10-CM | POA: Insufficient documentation

## 2013-12-29 NOTE — Assessment & Plan Note (Signed)
She has not had any labs checked recently. She is not on any therapy. Check lipid panel with CMP today.

## 2013-12-29 NOTE — Assessment & Plan Note (Signed)
Discussed importance of picking up her exercise level and watching her dietary intake.

## 2013-12-29 NOTE — Assessment & Plan Note (Signed)
These palpation seemed to become less and less bothersome to her. Continue calcium channel blocker.

## 2013-12-29 NOTE — Progress Notes (Signed)
PATIENT: [name] MRN: [id number] DOB: 05-28-1975 PCP: No PCP Per Patient  Clinic Note: Chief Complaint  Patient presents with  . Annual Exam     no chest pain , no sob , no edema , elevated blood pressure last week at gyn-160/100    HPI: [name] is a 38 y.o. female with a PMH below who presents today for what amounts to be a 2 year followup for difficult to control hypertension as well as palpitations. She last saw Kerin Ransom, [place]  in December of 2013. At that time he was to start her on low-dose beta blocker in addition to diltiazem for her PVCs. She has not followed up cava now presents here as she has run out of her antihypertensive medications. She isn't been out of her HCTZ for couple weeks now..  Interval History: Over last week or so she's notable with more puffiness in her feet, but is still able exercise and do most well or she was without any major complaints. Denies any headaches or blurred vision in her syncope/near syncope. No chest tightness /  pressure or dyspnea at rest exertion. No PND or orthopnea pedal edema. Additionally, in the last year she's not had much in the way of any palpitations either. Cardiovascular ROS: no chest pain or dyspnea on exertion positive for - edema negative for - irregular heartbeat, loss of consciousness, murmur, orthopnea, palpitations, paroxysmal nocturnal dyspnea, rapid heart rate, shortness of breath or Syncope near syncope, TIAs/ amaurosis fugax, lightheadedness or dizziness; claudication :  Past Medical History  Diagnosis Date  . Essential hypertension   . Trichomonal infection   . Syphilis   . Fibroids 2012  . Heart palpitations     Noted to have PVCs and no arrhythmia on cardiac monitor -not taking beta blocker;  ECHO December 2013: EF 55-60% with mild LA ablation. Otherwise normal.   No Known Allergies  Current Outpatient Prescriptions  Medication Sig Dispense Refill  . diltiazem (CARTIA XT) 240 MG 24 hr  capsule Take 1 capsule (240 mg total) by mouth daily.  30 capsule  1  . ibuprofen (ADVIL,MOTRIN) 800 MG tablet Take 1 tablet (800 mg total) by mouth every 8 (eight) hours as needed.  30 tablet  5  . Prenatal MV-Min-Fe Fum-FA-DHA (PRENATAL 1 PO) Take by mouth.      . hydrochlorothiazide (HYDRODIURIL) 25 MG tablet Take 1 tablet (25 mg total) by mouth daily.  30 tablet  11   No current facility-administered medications for this visit.    History   Social History Narrative   Single. Still smokes maybe a quarter of a pack a day. Does not take alcohol or caffeine to    family history includes Early death in her father; Hypertension in her father.  ROS: A comprehensive Review of Systems - Was performed Review of Systems  Constitutional: Negative.   HENT: Negative for congestion, nosebleeds and tinnitus.   Eyes: Negative for blurred vision and double vision.  Respiratory: Negative for cough, hemoptysis, sputum production, shortness of breath and wheezing.   Cardiovascular: Positive for leg swelling.  Gastrointestinal: Negative for constipation, blood in stool and melena.  Genitourinary: Negative for hematuria and flank pain.  Musculoskeletal: Negative.   Neurological: Positive for headaches. Negative for dizziness, sensory change, speech change, focal weakness, seizures and loss of consciousness.  Endo/Heme/Allergies: Does not bruise/bleed easily.  Psychiatric/Behavioral: Negative for depression and substance abuse. The patient is nervous/anxious.   All other systems reviewed and are negative.  PHYSICAL EXAM BP 142/88  Pulse 81  Ht 5' (1.524 m)  Wt 162 lb 3.2 oz (73.573 kg)  BMI 31.68 kg/m2  LMP 11/28/2013 General appearance: alert, cooperative, appears stated age, no distress, mildly obese and Otherwise healthy-appearing Neck: no adenopathy, no carotid bruit, no JVD and supple, symmetrical, trachea midline Lungs: clear to auscultation bilaterally, normal percussion bilaterally and  Nonlabored, good air movement Heart: regular rate and rhythm, S1, S2 normal, no murmur, click, rub or gallop and normal apical impulse Abdomen: soft, non-tender; bowel sounds normal; no masses,  no organomegaly Extremities: extremities normal, atraumatic, no cyanosis or edema Pulses: 2+ and symmetric Neurologic: Alert and oriented X 3, normal strength and tone. Normal symmetric reflexes. Normal coordination and gait   Adult ECG Report  Rate: 81 ;  Rhythm: normal sinus rhythm  Other Abnormalities: Appears to be incorrect lead placement with rightward axis; this also would suggest possible lateral MI.  Recent Labs: None available  ASSESSMENT / PLAN: Overall stable from a cardiac standpoint. Blood pressure is borderline control.  Essential hypertension It would appear that her blood pressure probably is well-controlled and she is back on her HCTZ. Also the related prescription for HCTZ and refill diltiazem prescription.  I better just that the puffiness in her ankles is probably related to calcium blocker and not be on the diuretic  Heart palpitations These palpation seemed to become less and less bothersome to her. Continue calcium channel blocker.  Obesity (BMI 30-39.9) Discussed importance of picking up her exercise level and watching her dietary intake.  Hyperlipidemia with target LDL less than 100 She has not had any labs checked recently. She is not on any therapy. Check lipid panel with CMP today.    Orders Placed This Encounter  Procedures  . Comprehensive metabolic panel    Order Specific Question:  Has the patient fasted?    Answer:  Yes  . Lipid panel    Order Specific Question:  Has the patient fasted?    Answer:  Yes  . EKG 12-Lead   Meds ordered this encounter  Medications  . Prenatal MV-Min-Fe Fum-FA-DHA (PRENATAL 1 PO)    Sig: Take by mouth.  . DISCONTD: hydrochlorothiazide (MICROZIDE) 12.5 MG capsule    Sig: Take 1 capsule (12.5 mg total) by mouth daily.     Dispense:  30 capsule    Refill:  11  . diltiazem (CARTIA XT) 240 MG 24 hr capsule    Sig: Take 1 capsule (240 mg total) by mouth daily.    Dispense:  30 capsule    Refill:  1  . hydrochlorothiazide (HYDRODIURIL) 25 MG tablet    Sig: Take 1 tablet (25 mg total) by mouth daily.    Dispense:  30 tablet    Refill:  11    Followup: Kerin Ransom, PA-C. in 2 months months; and BMP before.  Followup with me in one year   DAVID W. Ellyn Hack, M.D., M.S. Interventional Cardiolgy [facility]

## 2013-12-29 NOTE — Assessment & Plan Note (Addendum)
It would appear that her blood pressure probably is well-controlled and she is back on her HCTZ. Also the related prescription for HCTZ and refill diltiazem prescription.  I better just that the puffiness in her ankles is probably related to calcium blocker and not be on the diuretic

## 2014-01-08 ENCOUNTER — Telehealth: Payer: Self-pay | Admitting: Cardiology

## 2014-01-08 MED ORDER — DILTIAZEM HCL ER COATED BEADS 240 MG PO CP24: 240.0000 mg | ORAL_CAPSULE | Freq: Every day | ORAL | Status: DC

## 2014-01-08 NOTE — Telephone Encounter (Signed)
Patient was able to pick up diltiazem. New Rx with more refills sent in.

## 2014-01-08 NOTE — Telephone Encounter (Signed)
Pt called in stating that she was unable to pick up her prescription for her Diltiazem because the pharmacy never received the request from our office but she was able to pick up her fluid pills. Please call  Thanks

## 2014-01-27 ENCOUNTER — Encounter: Payer: Self-pay | Admitting: Cardiology

## 2014-03-17 ENCOUNTER — Telehealth: Payer: Self-pay | Admitting: Cardiology

## 2014-03-17 MED ORDER — DILTIAZEM HCL ER COATED BEADS 240 MG PO CP24: 240.0000 mg | ORAL_CAPSULE | Freq: Every day | ORAL | Status: DC

## 2014-03-17 NOTE — Telephone Encounter (Signed)
Pt still have not received her Diltiazem.Please call to [place].

## 2014-03-17 NOTE — Telephone Encounter (Signed)
Sent refill in to pharm of choice. Pt voiced understanding.

## 2014-03-24 ENCOUNTER — Encounter: Payer: Self-pay | Admitting: *Deleted

## 2014-03-25 ENCOUNTER — Encounter: Payer: Self-pay | Admitting: Obstetrics & Gynecology

## 2014-05-27 ENCOUNTER — Encounter: Payer: Self-pay | Admitting: Certified Nurse Midwife

## 2014-05-27 ENCOUNTER — Ambulatory Visit (INDEPENDENT_AMBULATORY_CARE_PROVIDER_SITE_OTHER): Payer: BLUE CROSS/BLUE SHIELD | Admitting: Certified Nurse Midwife

## 2014-05-27 VITALS — BP 146/87 | HR 85 | Temp 98.8°F | Ht 60.0 in | Wt 165.0 lb

## 2014-05-27 DIAGNOSIS — Z30018 Encounter for initial prescription of other contraceptives: Secondary | ICD-10-CM | POA: Diagnosis not present

## 2014-05-27 DIAGNOSIS — N760 Acute vaginitis: Secondary | ICD-10-CM

## 2014-05-27 DIAGNOSIS — A499 Bacterial infection, unspecified: Secondary | ICD-10-CM

## 2014-05-27 DIAGNOSIS — B9689 Other specified bacterial agents as the cause of diseases classified elsewhere: Secondary | ICD-10-CM

## 2014-05-27 LAB — POCT URINE PREGNANCY: Preg Test, Ur: NEGATIVE

## 2014-05-27 MED ORDER — ETONOGESTREL-ETHINYL ESTRADIOL 0.12-0.015 MG/24HR VA RING: VAGINAL_RING | VAGINAL | Status: DC

## 2014-05-27 MED ORDER — METRONIDAZOLE 0.75 % VA GEL: 1.0000 | VAGINAL | Status: DC

## 2014-05-27 NOTE — Addendum Note (Signed)
Addended by: Ladona Ridgel on: 05/27/2014 03:30 PM   Modules accepted: Orders

## 2014-05-27 NOTE — Progress Notes (Signed)
Patient ID: PHENIX GREIN, female   DOB: [birth year], 39 y.o.   MRN: [id number]  Chief Complaint  Patient presents with  . Problem    Possible Infection     HPI Mieka L Weider is a 39 y.o. female.  C/O "fishy" smelling yellowish tinted vaginal discharge.  Has had BV in the past.  Does not want oral medication for BV.  Is currently sexually active and not using contraception.  Desires STD testing and contraception management.    HPI  Past Medical History  Diagnosis Date  . Essential hypertension   . Trichomonal infection   . Syphilis   . Fibroids 2012  . Heart palpitations     Noted to have PVCs and no arrhythmia on cardiac monitor -not taking beta blocker;  ECHO December 2013: EF 55-60% with mild LA ablation. Otherwise normal.    Past Surgical History  Procedure Laterality Date  . Gallbladder surgery    . Eye surgery    . Cholecystectomy      Family History  Problem Relation Age of Onset  . Hypertension Father   . Early death Father     Social History History  Substance Use Topics  . Smoking status: Current Every Day Smoker -- 0.25 packs/day    Types: Cigarettes    Start date: 05/29/2003  . Smokeless tobacco: Never Used     Comment: smoke about 4 a day  . Alcohol Use: 1.0 oz/week    2 Standard drinks or equivalent per week     Comment: Occasionaly     No Known Allergies  Current Outpatient Prescriptions  Medication Sig Dispense Refill  . diltiazem (CARTIA XT) 240 MG 24 hr capsule Take 1 capsule (240 mg total) by mouth daily. 30 capsule 10  . hydrochlorothiazide (HYDRODIURIL) 25 MG tablet Take 1 tablet (25 mg total) by mouth daily. 30 tablet 11  . ibuprofen (ADVIL,MOTRIN) 800 MG tablet Take 1 tablet (800 mg total) by mouth every 8 (eight) hours as needed. 30 tablet 5  . Prenatal MV-Min-Fe Fum-FA-DHA (PRENATAL 1 PO) Take by mouth.    . etonogestrel-ethinyl estradiol (NUVARING) 0.12-0.015 MG/24HR vaginal ring Insert vaginally and leave in place for 3  consecutive weeks, then remove for 1 week. 1 each 12  . metroNIDAZOLE (METROGEL VAGINAL) 0.75 % vaginal gel Place 1 Applicatorful vaginally 2 (two) times a week. For 4-6 months. 70 g 3   No current facility-administered medications for this visit.    Review of Systems Review of Systems Constitutional: negative for fatigue and weight loss Respiratory: negative for cough and wheezing Cardiovascular: negative for chest pain, fatigue and palpitations Gastrointestinal: negative for abdominal pain and change in bowel habits Genitourinary:negative Integument/breast: negative for nipple discharge Musculoskeletal:negative for myalgias Neurological: negative for gait problems and tremors Behavioral/Psych: negative for abusive relationship, depression Endocrine: negative for temperature intolerance     Blood pressure 146/87, pulse 85, temperature 98.8 F (37.1 C), height 5' (1.524 m), weight 74.844 kg (165 lb), last menstrual period 04/29/2014.  Physical Exam Physical Exam General:   alert  Skin:   no rash or abnormalities  Lungs:   clear to auscultation bilaterally  Heart:   regular rate and rhythm, S1, S2 normal, no murmur, click, rub or gallop  Breasts:   normal without suspicious masses, skin or nipple changes or axillary nodes  Abdomen:  normal findings: no organomegaly, soft, non-tender and no hernia  Pelvis:  External genitalia: normal general appearance Urinary system: urethral meatus normal and bladder without fullness,  nontender Vaginal: normal without tenderness, induration or masses Cervix: normal appearance Adnexa: normal bimanual exam Uterus: anteverted and non-tender, normal size    75% of 15 min visit spent on counseling and coordination of care.   Data Reviewed Previous medical hx, labs  Assessment     Contraception Management Chronic BV     Plan    No orders of the defined types were placed in this encounter.   Meds ordered this encounter  Medications  .  metroNIDAZOLE (METROGEL VAGINAL) 0.75 % vaginal gel    Sig: Place 1 Applicatorful vaginally 2 (two) times a week. For 4-6 months.    Dispense:  70 g    Refill:  3  . etonogestrel-ethinyl estradiol (NUVARING) 0.12-0.015 MG/24HR vaginal ring    Sig: Insert vaginally and leave in place for 3 consecutive weeks, then remove for 1 week.    Dispense:  1 each    Refill:  12    Possible management options include:  Mirena IUD insertion with next period.  Follow up as needed and for annual exam.

## 2014-05-30 LAB — SURESWAB, VAGINOSIS/VAGINITIS PLUS
Atopobium vaginae: 6.9 Log (cells/mL)
C. GLABRATA, DNA: NOT DETECTED
C. albicans, DNA: NOT DETECTED
C. parapsilosis, DNA: NOT DETECTED
C. trachomatis RNA, TMA: NOT DETECTED
C. tropicalis, DNA: NOT DETECTED
LACTOBACILLUS SPECIES: NOT DETECTED Log (cells/mL)
MEGASPHAERA SPECIES: 7.7 Log (cells/mL)
N. gonorrhoeae RNA, TMA: NOT DETECTED
T. VAGINALIS RNA, QL TMA: NOT DETECTED

## 2014-06-02 ENCOUNTER — Telehealth: Payer: Self-pay | Admitting: *Deleted

## 2014-06-02 NOTE — Telephone Encounter (Signed)
Patient is interested in a Mirena IUD for contraception. Attempted to contact the patient to schedule the procedure and left message on voicemail to contact the office.

## 2014-06-13 ENCOUNTER — Encounter: Payer: Self-pay | Admitting: *Deleted

## 2014-08-04 NOTE — Telephone Encounter (Signed)
Patient has not returned call to the office. Will re-file call.

## 2014-10-05 ENCOUNTER — Encounter (HOSPITAL_COMMUNITY): Payer: Self-pay | Admitting: Emergency Medicine

## 2014-10-05 ENCOUNTER — Emergency Department (HOSPITAL_COMMUNITY)
Admission: EM | Admit: 2014-10-05 | Discharge: 2014-10-05 | Disposition: A | Discharge status: Home / Self Care | Payer: BLUE CROSS/BLUE SHIELD | Source: Home / Self Care | Attending: Emergency Medicine | Admitting: Emergency Medicine

## 2014-10-05 DIAGNOSIS — M546 Pain in thoracic spine: Secondary | ICD-10-CM | POA: Diagnosis not present

## 2014-10-05 MED ORDER — CYCLOBENZAPRINE HCL 10 MG PO TABS: 10.0000 mg | ORAL_TABLET | Freq: Three times a day (TID) | ORAL | Status: DC | PRN

## 2014-10-05 MED ORDER — TRAMADOL HCL 50 MG PO TABS
50.0000 mg | ORAL_TABLET | Freq: Four times a day (QID) | ORAL | Status: DC | PRN
Start: 2014-10-05 — End: 2015-02-02

## 2014-10-05 MED ORDER — MELOXICAM 15 MG PO TABS: 15.0000 mg | ORAL_TABLET | Freq: Every day | ORAL | Status: DC

## 2014-10-05 NOTE — Discharge Instructions (Signed)
You have likely irritated the muscles in your upper back. Alternate ice and heat to the area at least 3 times a day. Take meloxicam 1 pill daily for the next week, then as needed. Use Flexeril 3 times a day as needed for pain flares. This medicine will make you drowsy. Use the tramadol every 6-8 hours as needed for severe pain. Do not drive all taking this medicine. You should see some improvement in the next 2-3 days, but this will take several weeks to fully resolve. If you develop difficulty breathing, weakness in your hands, or shooting pains down your arms, please come back.

## 2014-10-05 NOTE — ED Provider Notes (Signed)
CSN: [id number]     Arrival date & time 10/05/14  1454 History   First MD Initiated Contact with Patient 10/05/14 1532     Chief Complaint  Patient presents with  . Back Pain   (Consider location/radiation/quality/duration/timing/severity/associated sxs/prior Treatment) HPI  She is a 39 year old woman here for evaluation of upper back pain. She states this started yesterday and is worse today. She has tried 800 mg ibuprofen tablets without much improvement. She describes a constant nagging pain in her bilateral upper back with occasional flares. No pain radiating down the arms. No weakness in her arms or hands. No neck pain or lower back pain. She denies any shortness of breath. Pain is worse with certain movements.  No injury or trauma. No new activities.  Past Medical History  Diagnosis Date  . Essential hypertension   . Trichomonal infection   . Syphilis   . Fibroids 2012  . Heart palpitations     Noted to have PVCs and no arrhythmia on cardiac monitor -not taking beta blocker;  ECHO December 2013: EF 55-60% with mild LA ablation. Otherwise normal.   Past Surgical History  Procedure Laterality Date  . Gallbladder surgery    . Eye surgery    . Cholecystectomy     Family History  Problem Relation Age of Onset  . Hypertension Father   . Early death Father   . Diabetes Mother    History  Substance Use Topics  . Smoking status: Current Every Day Smoker -- 0.25 packs/day    Types: Cigarettes    Start date: 05/29/2003  . Smokeless tobacco: Never Used     Comment: smoke about 4 a day  . Alcohol Use: 1.0 oz/week    2 Standard drinks or equivalent per week     Comment: Occasionaly    OB History    Gravida Para Term Preterm AB TAB SAB Ectopic Multiple Living   2 2 2       2      Review of Systems As in history of present illness Allergies  Review of patient's allergies indicates no known allergies.  Home Medications   Prior to Admission medications   Medication Sig  Start Date End Date Taking? Authorizing Provider  cyclobenzaprine (FLEXERIL) 10 MG tablet Take 1 tablet (10 mg total) by mouth 3 (three) times daily as needed for muscle spasms. 10/05/14   Melony Overly, MD  diltiazem (CARTIA XT) 240 MG 24 hr capsule Take 1 capsule (240 mg total) by mouth daily. 03/17/14   Leonie Man, MD  etonogestrel-ethinyl estradiol (NUVARING) 0.12-0.015 MG/24HR vaginal ring Insert vaginally and leave in place for 3 consecutive weeks, then remove for 1 week. 05/27/14   Rachelle A Denney, CNM  hydrochlorothiazide (HYDRODIURIL) 25 MG tablet Take 1 tablet (25 mg total) by mouth daily. 12/26/13   Leonie Man, MD  ibuprofen (ADVIL,MOTRIN) 800 MG tablet Take 1 tablet (800 mg total) by mouth every 8 (eight) hours as needed. 05/07/13   Shelly Bombard, MD  meloxicam (MOBIC) 15 MG tablet Take 1 tablet (15 mg total) by mouth daily. 10/05/14   Melony Overly, MD  metroNIDAZOLE (METROGEL VAGINAL) 0.75 % vaginal gel Place 1 Applicatorful vaginally 2 (two) times a week. For 4-6 months. 05/27/14   Morene Crocker, CNM  Prenatal MV-Min-Fe Fum-FA-DHA (PRENATAL 1 PO) Take by mouth.    Historical Provider, MD  traMADol (ULTRAM) 50 MG tablet Take 1 tablet (50 mg total) by mouth every 6 (six) hours  as needed. 10/05/14   Melony Overly, MD   BP 144/88 mmHg  Pulse 74  Temp(Src) 98.6 F (37 C) (Oral)  Resp 16  SpO2 99%  LMP 09/22/2014 Physical Exam  Constitutional: She is oriented to person, place, and time. She appears well-developed and well-nourished. No distress.  Neck: Normal range of motion. Neck supple.  Cardiovascular: Normal rate.   Pulmonary/Chest: Effort normal and breath sounds normal. No respiratory distress. She has no wheezes. She has no rales.  Musculoskeletal:       Back:  Back: No vertebral tenderness or step-offs. No palpable muscle spasm. 5 out of 5 strength in bilateral upper extremities.  Neurological: She is alert and oriented to person, place, and time.    ED Course    Procedures (including critical care time) Labs Review Labs Reviewed - No data to display  Imaging Review No results found.   MDM   1. Bilateral thoracic back pain    Likely musculoskeletal pain. No radicular symptoms or vertebral tenderness. No symptoms or exam findings to suggest lung pathology. Treat with meloxicam, Flexeril, tramadol. Recommended ice and heat. Follow-up as needed.    Melony Overly, MD 10/05/14 [phone]

## 2014-10-05 NOTE — ED Notes (Signed)
Pt comes in with upper back pain, denies injury or strain Started yesterday, sharp achy intermit pains Tried Ibuprofen 800 mg tab without relief

## 2014-10-22 ENCOUNTER — Encounter: Payer: Self-pay | Admitting: Cardiology

## 2015-01-06 ENCOUNTER — Other Ambulatory Visit: Payer: Self-pay | Admitting: Cardiology

## 2015-01-07 NOTE — Telephone Encounter (Signed)
Rx(s) sent to pharmacy electronically.  

## 2015-02-02 ENCOUNTER — Ambulatory Visit (INDEPENDENT_AMBULATORY_CARE_PROVIDER_SITE_OTHER): Payer: BLUE CROSS/BLUE SHIELD | Admitting: Obstetrics

## 2015-02-02 ENCOUNTER — Encounter: Payer: Self-pay | Admitting: Obstetrics

## 2015-02-02 VITALS — BP 128/84 | HR 85 | Temp 98.2°F | Wt 163.0 lb

## 2015-02-02 DIAGNOSIS — N760 Acute vaginitis: Secondary | ICD-10-CM | POA: Diagnosis not present

## 2015-02-02 DIAGNOSIS — A499 Bacterial infection, unspecified: Secondary | ICD-10-CM

## 2015-02-02 DIAGNOSIS — B9689 Other specified bacterial agents as the cause of diseases classified elsewhere: Secondary | ICD-10-CM

## 2015-02-03 ENCOUNTER — Encounter: Payer: Self-pay | Admitting: Obstetrics

## 2015-02-03 MED ORDER — TINIDAZOLE 500 MG PO TABS: 1000.0000 mg | ORAL_TABLET | Freq: Every day | ORAL | Status: DC

## 2015-02-03 NOTE — Progress Notes (Signed)
Patient ID: [name], female   DOB: [birth year], 39 y.o.   MRN: [id number]  Chief Complaint  Patient presents with  . Vaginitis    HPI [name] is a 39 y.o. female.  Malodorous vaginal discharge.  HPI  Past Medical History  Diagnosis Date  . Essential hypertension   . Trichomonal infection   . Syphilis   . Fibroids 2012  . Heart palpitations     Noted to have PVCs and no arrhythmia on cardiac monitor -not taking beta blocker;  ECHO December 2013: EF 55-60% with mild LA ablation. Otherwise normal.    Past Surgical History  Procedure Laterality Date  . Gallbladder surgery    . Eye surgery    . Cholecystectomy      Family History  Problem Relation Age of Onset  . Hypertension Father   . Early death Father   . Diabetes Mother     Social History Social History  Substance Use Topics  . Smoking status: Current Every Day Smoker -- 0.25 packs/day    Types: Cigarettes    Start date: 05/29/2003  . Smokeless tobacco: Never Used     Comment: smoke about 4 a day  . Alcohol Use: 1.2 oz/week    2 Standard drinks or equivalent per week     Comment: Occasionaly     No Known Allergies  Current Outpatient Prescriptions  Medication Sig Dispense Refill  . diltiazem (CARTIA XT) 240 MG 24 hr capsule Take 1 capsule (240 mg total) by mouth daily. 30 capsule 10  . hydrochlorothiazide (HYDRODIURIL) 25 MG tablet Take 1 tablet (25 mg total) by mouth daily. <PLEASE MAKE APPOINTMENT FOR REFILLS> 30 tablet 0  . etonogestrel-ethinyl estradiol (NUVARING) 0.12-0.015 MG/24HR vaginal ring Insert vaginally and leave in place for 3 consecutive weeks, then remove for 1 week. (Patient not taking: Reported on 02/02/2015) 1 each 12  . tinidazole (TINDAMAX) 500 MG tablet Take 2 tablets (1,000 mg total) by mouth daily with breakfast. 10 tablet 2   No current facility-administered medications for this visit.    Review of Systems Review of Systems Constitutional: negative for fatigue and  weight loss Respiratory: negative for cough and wheezing Cardiovascular: negative for chest pain, fatigue and palpitations Gastrointestinal: negative for abdominal pain and change in bowel habits Genitourinary:negative Integument/breast: negative for nipple discharge Musculoskeletal:negative for myalgias Neurological: negative for gait problems and tremors Behavioral/Psych: negative for abusive relationship, depression Endocrine: negative for temperature intolerance     Blood pressure 128/84, pulse 85, temperature 98.2 F (36.8 C), weight 163 lb (73.936 kg), last menstrual period 01/19/2015.  Physical Exam Physical Exam           General:  Alert and no distress Abdomen:  normal findings: no organomegaly, soft, non-tender and no hernia  Pelvis:  External genitalia: normal general appearance Urinary system: urethral meatus normal and bladder without fullness, nontender Vaginal: normal without tenderness, induration or masses Cervix: normal appearance Adnexa: normal bimanual exam Uterus: anteverted and non-tender, normal size      Data Reviewed Labs  Assessment     BV     Plan    Tindamax Rx F/U in 3 months   Orders Placed This Encounter  Procedures  . SureSwab, Vaginosis/Vaginitis Plus   Meds ordered this encounter  Medications  . tinidazole (TINDAMAX) 500 MG tablet    Sig: Take 2 tablets (1,000 mg total) by mouth daily with breakfast.    Dispense:  10 tablet    Refill:  2

## 2015-02-05 LAB — SURESWAB, VAGINOSIS/VAGINITIS PLUS
Atopobium vaginae: DETECTED Log (cells/mL)
BV CATEGORY: UNDETERMINED — AB
C. GLABRATA, DNA: NOT DETECTED
C. TRACHOMATIS RNA, TMA: NOT DETECTED
C. albicans, DNA: NOT DETECTED
C. parapsilosis, DNA: NOT DETECTED
C. tropicalis, DNA: NOT DETECTED
LACTOBACILLUS SPECIES: 5.2 Log (cells/mL)
MEGASPHAERA SPECIES: 5.6 Log (cells/mL)
N. gonorrhoeae RNA, TMA: NOT DETECTED
T. vaginalis RNA, QL TMA: NOT DETECTED

## 2015-02-06 ENCOUNTER — Telehealth: Payer: Self-pay | Admitting: Cardiology

## 2015-02-06 ENCOUNTER — Other Ambulatory Visit: Payer: Self-pay | Admitting: Obstetrics

## 2015-02-09 ENCOUNTER — Other Ambulatory Visit: Payer: Self-pay

## 2015-02-09 DIAGNOSIS — Z1231 Encounter for screening mammogram for malignant neoplasm of breast: Secondary | ICD-10-CM

## 2015-02-09 NOTE — Telephone Encounter (Signed)
°*  STAT* If patient is at the pharmacy, call can be transferred to refill team.   1. Which medications need to be refilled? (please list name of each medication and dose if known) Diltiazem and Hydrochlorothiazide   2.  Which pharmacy/location (including street and city if local pharmacy) is medication to be sent to? [facility] [phone]  3. Do they need a 30 day or 90 day supply? 30 and refills please call today if possible.

## 2015-02-09 NOTE — Telephone Encounter (Signed)
Diltiazem refill already sent to pharmacy.

## 2015-02-09 NOTE — Telephone Encounter (Signed)
Pt's medication sent to pt's pharmacy requested. Confirmation received.

## 2015-02-16 NOTE — Telephone Encounter (Signed)
Diltiazem refill already sent to pharmacy.

## 2015-02-21 ENCOUNTER — Emergency Department (HOSPITAL_COMMUNITY)
Admission: EM | Admit: 2015-02-21 | Discharge: 2015-02-21 | Disposition: A | Discharge status: Home / Self Care | Payer: BLUE CROSS/BLUE SHIELD | Attending: Emergency Medicine | Admitting: Emergency Medicine

## 2015-02-21 ENCOUNTER — Encounter (HOSPITAL_COMMUNITY): Payer: Self-pay | Admitting: *Deleted

## 2015-02-21 DIAGNOSIS — H9203 Otalgia, bilateral: Secondary | ICD-10-CM | POA: Diagnosis present

## 2015-02-21 DIAGNOSIS — H9393 Unspecified disorder of ear, bilateral: Secondary | ICD-10-CM | POA: Diagnosis not present

## 2015-02-21 DIAGNOSIS — F17210 Nicotine dependence, cigarettes, uncomplicated: Secondary | ICD-10-CM | POA: Insufficient documentation

## 2015-02-21 DIAGNOSIS — Z79899 Other long term (current) drug therapy: Secondary | ICD-10-CM | POA: Diagnosis not present

## 2015-02-21 DIAGNOSIS — Z792 Long term (current) use of antibiotics: Secondary | ICD-10-CM | POA: Insufficient documentation

## 2015-02-21 DIAGNOSIS — Z8619 Personal history of other infectious and parasitic diseases: Secondary | ICD-10-CM | POA: Diagnosis not present

## 2015-02-21 DIAGNOSIS — Z8742 Personal history of other diseases of the female genital tract: Secondary | ICD-10-CM | POA: Diagnosis not present

## 2015-02-21 DIAGNOSIS — Z86018 Personal history of other benign neoplasm: Secondary | ICD-10-CM | POA: Diagnosis not present

## 2015-02-21 DIAGNOSIS — I10 Essential (primary) hypertension: Secondary | ICD-10-CM | POA: Insufficient documentation

## 2015-02-21 DIAGNOSIS — H938X3 Other specified disorders of ear, bilateral: Secondary | ICD-10-CM

## 2015-02-21 MED ORDER — LORATADINE 10 MG PO TABS: 10.0000 mg | ORAL_TABLET | Freq: Every day | ORAL | Status: DC

## 2015-02-21 NOTE — Discharge Instructions (Signed)
Please take your medications as prescribed. This should help with your nasal congestion. Please follow-up with your doctor in the next 3-4 days for further evaluation and management of your symptoms. Return to ED for any new or worsening symptoms.

## 2015-02-21 NOTE — ED Notes (Signed)
Pt has c/o ear pain and sinus congestion. States sx started yesterday.

## 2015-02-21 NOTE — ED Notes (Signed)
Pt is in stable condition upon d/c and ambulates from ED. 

## 2015-02-21 NOTE — ED Provider Notes (Signed)
CSN: [id number]     Arrival date & time 02/21/15  0906 History  By signing my name below, I, [name], attest that this documentation has been prepared under the direction and in the presence of Comer Locket, [employer] Electronically Signed: Soijett Benson, ED Scribe. 02/21/2015. 9:28 AM.   Chief Complaint  Patient presents with  . Otalgia      The history is provided by the patient. No language interpreter was used.    [name] is a 39 y.o. female with a medical hx of HTN and heart palpitations who presents to the Emergency Department complaining of 8.5/10, constant, moderate, bilateral ear pain x yesterday. She notes that it feels like something is in her ears. Pt is having associated symptoms of congestion, heart racing, mild dizziness. She notes that she has not tried any medications for the relief of her symptoms. She denies n/v, HA, fever, cough, rhinorrhea, sore throat, abdominal pain, and any other symptoms. Denies sick contacts or being sick recently. Pt denies having eustachian tubes as a child.    Past Medical History  Diagnosis Date  . Essential hypertension   . Trichomonal infection   . Syphilis   . Fibroids 2012  . Heart palpitations     Noted to have PVCs and no arrhythmia on cardiac monitor -not taking beta blocker;  ECHO December 2013: EF 55-60% with mild LA ablation. Otherwise normal.   Past Surgical History  Procedure Laterality Date  . Gallbladder surgery    . Eye surgery    . Cholecystectomy     Family History  Problem Relation Age of Onset  . Hypertension Father   . Early death Father   . Diabetes Mother    Social History  Substance Use Topics  . Smoking status: Current Every Day Smoker -- 0.25 packs/day    Types: Cigarettes    Start date: 05/29/2003  . Smokeless tobacco: Never Used     Comment: smoke about 4 a day  . Alcohol Use: 1.2 oz/week    2 Standard drinks or equivalent per week     Comment: Occasionaly    OB History    Gravida  Para Term Preterm AB TAB SAB Ectopic Multiple Living   2 2 2       2      Review of Systems  Constitutional: Negative for fever.  HENT: Positive for congestion and ear pain. Negative for ear discharge, rhinorrhea and sore throat.   Respiratory: Negative for cough.   Gastrointestinal: Negative for nausea, vomiting and abdominal pain.  Neurological: Positive for dizziness. Negative for headaches.      Allergies  Review of patient's allergies indicates no known allergies.  Home Medications   Prior to Admission medications   Medication Sig Start Date End Date Taking? Authorizing Provider  diltiazem (CARDIZEM CD) 240 MG 24 hr capsule take 1 capsule by mouth once daily 02/09/15   Leonie Man, MD  etonogestrel-ethinyl estradiol (NUVARING) 0.12-0.015 MG/24HR vaginal ring Insert vaginally and leave in place for 3 consecutive weeks, then remove for 1 week. Patient not taking: Reported on 02/02/2015 05/27/14   Rachelle A Denney, CNM  hydrochlorothiazide (HYDRODIURIL) 25 MG tablet take 1 tablet by mouth once daily 02/09/15   Leonie Man, MD  loratadine (CLARITIN) 10 MG tablet Take 1 tablet (10 mg total) by mouth daily. 02/21/15   Comer Locket, PA-C  tinidazole (TINDAMAX) 500 MG tablet Take 2 tablets (1,000 mg total) by mouth daily with breakfast. 02/03/15  Shelly Bombard, MD   BP 144/83 mmHg  Pulse 85  Temp(Src) 98.4 F (36.9 C) (Oral)  Resp 18  Ht 5' (1.524 m)  Wt 72.802 kg  BMI 31.35 kg/m2  SpO2 98%  LMP 01/19/2015 Physical Exam  Constitutional: She is oriented to person, place, and time. She appears well-developed and well-nourished. No distress.  HENT:  Head: Normocephalic and atraumatic.  Right Ear: Tympanic membrane, external ear and ear canal normal.  Left Ear: Tympanic membrane, external ear and ear canal normal.  Nose: Right sinus exhibits no maxillary sinus tenderness and no frontal sinus tenderness. Left sinus exhibits no maxillary sinus tenderness and no frontal  sinus tenderness.  Mouth/Throat: Oropharynx is clear and moist.  Eyes: EOM are normal.  Neck: Neck supple.  Cardiovascular: Normal rate, regular rhythm and normal heart sounds.  Exam reveals no gallop and no friction rub.   No murmur heard. Pulmonary/Chest: Effort normal and breath sounds normal. No respiratory distress. She has no wheezes. She has no rales.  Abdominal: Soft. There is no tenderness.  Musculoskeletal: Normal range of motion.  Lymphadenopathy:       Head (right side): No occipital adenopathy present.       Head (left side): No occipital adenopathy present.    She has no cervical adenopathy.  Neurological: She is alert and oriented to person, place, and time.  Cranial nerves II-12 grossly intact. Motor and sensation are baseline. Gait is baseline.  Skin: Skin is warm and dry.  Psychiatric: She has a normal mood and affect. Her behavior is normal.  Nursing note and vitals reviewed.   ED Course  Procedures (including critical care time) DIAGNOSTIC STUDIES: Oxygen Saturation is 98% on RA, nl by my interpretation.    COORDINATION OF CARE: 9:27 AM Discussed treatment plan with pt at bedside which includes anti-histamine Rx and pt agreed to plan.    Labs Review Labs Reviewed - No data to display  Imaging Review No results found.    EKG Interpretation None     Meds given in ED:  Medications - No data to display  Discharge Medication List as of 02/21/2015  9:45 AM    START taking these medications   Details  loratadine (CLARITIN) 10 MG tablet Take 1 tablet (10 mg total) by mouth daily., Starting 02/21/2015, Until Discontinued, Print       Filed Vitals:   02/21/15 0910  BP: 144/83  Pulse: 85  Temp: 98.4 F (36.9 C)  TempSrc: Oral  Resp: 18  Height: 5' (1.524 m)  Weight: 72.802 kg  SpO2: 98%   3 MDM  [name] is a 39 y.o. female presents for evaluation of nasal congestion/ear fullness onset 1 day ago. Has not tried anything to improve  symptoms. On arrival, she is hemodynamically stable, normal vital signs and afebrile. No objective findings on physical exam. Low suspicion for central lesion, labyrinthitis, acoustic neuroma, Mnire's. We'll initiate antihistamine therapy to help with possible allergies versus nasal congestion. Encourage follow-up with PCP in the next 3-4 days for reevaluation. No evidence of other acute or emergent pathology at this time. Patient appears well, nontoxic and appropriate for discharge. Final diagnoses:  Ear fullness, bilateral    I personally performed the services described in this documentation, which was scribed in my presence. The recorded information has been reviewed and is accurate.    Comer Locket, PA-C 02/21/15 [id number]  Sherwood Gambler, MD 02/21/15 [phone]

## 2015-02-27 ENCOUNTER — Ambulatory Visit
Admission: RE | Admit: 2015-02-27 | Discharge: 2015-02-27 | Disposition: A | Discharge status: Home / Self Care | Payer: BLUE CROSS/BLUE SHIELD | Source: Ambulatory Visit

## 2015-02-27 DIAGNOSIS — Z1231 Encounter for screening mammogram for malignant neoplasm of breast: Secondary | ICD-10-CM

## 2015-03-02 ENCOUNTER — Other Ambulatory Visit: Payer: Self-pay | Admitting: Obstetrics

## 2015-03-02 DIAGNOSIS — R928 Other abnormal and inconclusive findings on diagnostic imaging of breast: Secondary | ICD-10-CM

## 2015-03-09 ENCOUNTER — Ambulatory Visit
Admission: RE | Admit: 2015-03-09 | Discharge: 2015-03-09 | Disposition: A | Discharge status: Home / Self Care | Payer: BLUE CROSS/BLUE SHIELD | Source: Ambulatory Visit | Attending: Obstetrics | Admitting: Obstetrics

## 2015-03-09 DIAGNOSIS — R928 Other abnormal and inconclusive findings on diagnostic imaging of breast: Secondary | ICD-10-CM

## 2015-03-11 ENCOUNTER — Other Ambulatory Visit: Payer: Self-pay | Admitting: Cardiology

## 2015-03-11 MED ORDER — DILTIAZEM HCL ER COATED BEADS 240 MG PO CP24: 240.0000 mg | ORAL_CAPSULE | Freq: Every day | ORAL | Status: DC

## 2015-03-11 MED ORDER — HYDROCHLOROTHIAZIDE 25 MG PO TABS: 25.0000 mg | ORAL_TABLET | Freq: Every day | ORAL | Status: DC

## 2015-03-11 NOTE — Telephone Encounter (Signed)
°*  STAT* If patient is at the pharmacy, call can be transferred to refill team.   1. Which medications need to be refilled? (please list name of each medication and dose if known) Diltiazem and Hydrchlronthazide  2. Which pharmacy/location (including street and city if local pharmacy) is medication to be sent to [place] 3. Do they need a 30 day or 90 day supply? 90 and refills

## 2015-03-11 NOTE — Telephone Encounter (Signed)
Diltiazem and HCTZ refilled electronically to [facility].

## 2015-04-14 ENCOUNTER — Other Ambulatory Visit: Payer: Self-pay | Admitting: Cardiology

## 2015-04-14 NOTE — Telephone Encounter (Signed)
°*  STAT* If patient is at the pharmacy, call can be transferred to refill team.   1. Which medications need to be refilled? (please list name of each medication and dose if known) Hydrochlorothiazide and Diltiazem  2. Which pharmacy/location (including street and city if local pharmacy) is medication to be sent to[facility] [phone]  3. Do they need a 30 day or 90 day supply? 30 and refills

## 2015-04-15 ENCOUNTER — Other Ambulatory Visit: Payer: Self-pay | Admitting: Cardiology

## 2015-04-15 MED ORDER — DILTIAZEM HCL ER COATED BEADS 240 MG PO CP24: 240.0000 mg | ORAL_CAPSULE | Freq: Every day | ORAL | Status: DC

## 2015-04-15 MED ORDER — HYDROCHLOROTHIAZIDE 25 MG PO TABS: 25.0000 mg | ORAL_TABLET | Freq: Every day | ORAL | Status: DC

## 2015-04-15 MED ORDER — HYDROCHLOROTHIAZIDE 25 MG PO TABS
25.0000 mg | ORAL_TABLET | Freq: Every day | ORAL | Status: DC
Start: 2015-04-15 — End: 2015-04-15

## 2015-04-15 NOTE — Telephone Encounter (Signed)
°*  STAT* If patient is at the pharmacy, call can be transferred to refill team.   1. Which medications need to be refilled? (please list name of each medication and dose if known) Diltiazem and HCTZ  2. Which pharmacy/location (including street and city if local pharmacy) is medication to be sent to? [facility] on [address]  3. Do they need a 30 day or 90 day supply? 90  * She is all out of medication and needs this tonight*

## 2015-04-16 MED ORDER — HYDROCHLOROTHIAZIDE 25 MG PO TABS: 25.0000 mg | ORAL_TABLET | Freq: Every day | ORAL | Status: DC

## 2015-04-16 MED ORDER — DILTIAZEM HCL ER COATED BEADS 240 MG PO CP24: 240.0000 mg | ORAL_CAPSULE | Freq: Every day | ORAL | Status: DC

## 2015-04-16 NOTE — Telephone Encounter (Signed)
Called to notify pt refills sent. No answer, goes to VM. LM for patient w/ notification that refills are ready for pickup at local pharm.

## 2015-05-20 ENCOUNTER — Inpatient Hospital Stay (HOSPITAL_COMMUNITY)
Admission: AD | Admit: 2015-05-20 | Discharge: 2015-05-20 | Discharge status: Absent without leave | Payer: BLUE CROSS/BLUE SHIELD | Source: Ambulatory Visit | Attending: Obstetrics | Admitting: Obstetrics

## 2015-05-20 ENCOUNTER — Ambulatory Visit (INDEPENDENT_AMBULATORY_CARE_PROVIDER_SITE_OTHER): Payer: BLUE CROSS/BLUE SHIELD | Admitting: Obstetrics

## 2015-05-20 ENCOUNTER — Encounter: Payer: Self-pay | Admitting: Obstetrics

## 2015-05-20 VITALS — BP 130/83 | HR 75 | Temp 98.3°F | Wt 161.0 lb

## 2015-05-20 DIAGNOSIS — N946 Dysmenorrhea, unspecified: Secondary | ICD-10-CM

## 2015-05-20 DIAGNOSIS — A499 Bacterial infection, unspecified: Secondary | ICD-10-CM

## 2015-05-20 DIAGNOSIS — N760 Acute vaginitis: Secondary | ICD-10-CM | POA: Diagnosis not present

## 2015-05-20 DIAGNOSIS — B9689 Other specified bacterial agents as the cause of diseases classified elsewhere: Secondary | ICD-10-CM

## 2015-05-20 LAB — POCT URINALYSIS DIPSTICK
Bilirubin, UA: NEGATIVE
Blood, UA: 250
GLUCOSE UA: NEGATIVE
Ketones, UA: NEGATIVE
LEUKOCYTES UA: NEGATIVE
Nitrite, UA: NEGATIVE
SPEC GRAV UA: 1.015
UROBILINOGEN UA: NEGATIVE
pH, UA: 7

## 2015-05-20 MED ORDER — METRONIDAZOLE 0.75 % VA GEL: 1.0000 | Freq: Two times a day (BID) | VAGINAL | Status: DC

## 2015-05-20 NOTE — Addendum Note (Signed)
Addended by: Lewie Loron D on: 05/20/2015 04:56 PM   Modules accepted: Orders

## 2015-05-20 NOTE — Progress Notes (Signed)
Patient ID: OCTAYVIA ADDIE, female   DOB: 1975-12-20, 40 y.o.   MRN: [id number]  Chief Complaint  Patient presents with  . Abdominal Pain    HPI [name] is a 40 y.o. female.  Malodorous vaginal discharge and abdominal cramping after taking a " [employer] ".  Patient has H/O chronic BV.  HPI  Past Medical History  Diagnosis Date  . Essential hypertension   . Trichomonal infection   . Syphilis   . Fibroids 2012  . Heart palpitations     Noted to have PVCs and no arrhythmia on cardiac monitor -not taking beta blocker;  ECHO December 2013: EF 55-60% with mild LA ablation. Otherwise normal.    Past Surgical History  Procedure Laterality Date  . Gallbladder surgery    . Eye surgery    . Cholecystectomy      Family History  Problem Relation Age of Onset  . Hypertension Father   . Early death Father   . Diabetes Mother     Social History Social History  Substance Use Topics  . Smoking status: Current Every Day Smoker -- 0.25 packs/day    Types: Cigarettes    Start date: 05/29/2003  . Smokeless tobacco: Never Used     Comment: smoke about 4 a day  . Alcohol Use: 1.2 oz/week    2 Standard drinks or equivalent per week     Comment: Occasionaly     No Known Allergies  Current Outpatient Prescriptions  Medication Sig Dispense Refill  . diltiazem (CARDIZEM CD) 240 MG 24 hr capsule Take 1 capsule (240 mg total) by mouth daily. Keep appointment. 90 capsule 0  . hydrochlorothiazide (HYDRODIURIL) 25 MG tablet Take 1 tablet (25 mg total) by mouth daily. 90 tablet 0  . loratadine (CLARITIN) 10 MG tablet Take 1 tablet (10 mg total) by mouth daily. 30 tablet 0  . etonogestrel-ethinyl estradiol (NUVARING) 0.12-0.015 MG/24HR vaginal ring Insert vaginally and leave in place for 3 consecutive weeks, then remove for 1 week. (Patient not taking: Reported on 02/02/2015) 1 each 12  . metroNIDAZOLE (METROGEL VAGINAL) 0.75 % vaginal gel Place 1 Applicatorful vaginally 2 (two)  times daily. 70 g 4   No current facility-administered medications for this visit.    Review of Systems Review of Systems Constitutional: negative for fatigue and weight loss Respiratory: negative for cough and wheezing Cardiovascular: negative for chest pain, fatigue.  Positive for palpitations Gastrointestinal: positive for abdominal pain and negative for change in bowel habits Genitourinary: positive for dysmenorrhea, chronic BV Integument/breast: negative for nipple discharge Musculoskeletal:negative for myalgias Neurological: negative for gait problems and tremors Behavioral/Psych: negative for abusive relationship, depression Endocrine: negative for temperature intolerance     Blood pressure 130/83, pulse 75, temperature 98.3 F (36.8 C), weight 161 lb (73.029 kg), last menstrual period 05/08/2015.  Physical Exam Physical Exam           General:  Alert and no distress Abdomen:  normal findings: no organomegaly, soft, non-tender and no hernia  Pelvis:  External genitalia: normal general appearance Urinary system: urethral meatus normal and bladder without fullness, nontender Vaginal: normal without tenderness, induration or masses.  Fishy D/C. Cervix: normal appearance Adnexa: normal bimanual exam Uterus: anteverted and non-tender, normal size      Data Reviewed Labs  Assessment     BV, chronic Dysmenorrhea     Plan    Tindamax dispensed and MetroGel Rx for monthly treatments after each cycle x 3 Ibuprofen prn for  dysmenorrhea F/U in 4 months for BV surveillance  No orders of the defined types were placed in this encounter.   Meds ordered this encounter  Medications  . metroNIDAZOLE (METROGEL VAGINAL) 0.75 % vaginal gel    Sig: Place 1 Applicatorful vaginally 2 (two) times daily.    Dispense:  70 g    Refill:  4

## 2015-05-20 NOTE — Addendum Note (Signed)
Addended by: Lewie Loron D on: 05/20/2015 05:04 PM   Modules accepted: Orders

## 2015-05-22 LAB — URINE CULTURE
Colony Count: NO GROWTH
ORGANISM ID, BACTERIA: NO GROWTH

## 2015-05-25 LAB — SURESWAB, VAGINOSIS/VAGINITIS PLUS
Atopobium vaginae: 7.7 Log (cells/mL)
C. GLABRATA, DNA: NOT DETECTED
C. TROPICALIS, DNA: NOT DETECTED
C. albicans, DNA: DETECTED — AB
C. parapsilosis, DNA: NOT DETECTED
C. trachomatis RNA, TMA: NOT DETECTED
LACTOBACILLUS SPECIES: NOT DETECTED Log (cells/mL)
N. gonorrhoeae RNA, TMA: NOT DETECTED
T. VAGINALIS RNA, QL TMA: NOT DETECTED

## 2015-05-26 ENCOUNTER — Other Ambulatory Visit: Payer: Self-pay | Admitting: Obstetrics

## 2015-06-03 ENCOUNTER — Ambulatory Visit: Payer: BLUE CROSS/BLUE SHIELD | Admitting: Obstetrics

## 2015-06-29 ENCOUNTER — Encounter: Payer: Self-pay | Admitting: Cardiology

## 2015-06-29 ENCOUNTER — Ambulatory Visit (INDEPENDENT_AMBULATORY_CARE_PROVIDER_SITE_OTHER): Payer: BLUE CROSS/BLUE SHIELD | Admitting: Cardiology

## 2015-06-29 VITALS — BP 116/84 | HR 70 | Ht 60.0 in | Wt 159.6 lb

## 2015-06-29 DIAGNOSIS — E785 Hyperlipidemia, unspecified: Secondary | ICD-10-CM | POA: Diagnosis not present

## 2015-06-29 DIAGNOSIS — I358 Other nonrheumatic aortic valve disorders: Secondary | ICD-10-CM

## 2015-06-29 DIAGNOSIS — I10 Essential (primary) hypertension: Secondary | ICD-10-CM | POA: Diagnosis not present

## 2015-06-29 DIAGNOSIS — R002 Palpitations: Secondary | ICD-10-CM

## 2015-06-29 MED ORDER — HYDROCHLOROTHIAZIDE 25 MG PO TABS: 25.0000 mg | ORAL_TABLET | Freq: Every day | ORAL | Status: DC

## 2015-06-29 MED ORDER — DILTIAZEM HCL ER COATED BEADS 240 MG PO CP24: 240.0000 mg | ORAL_CAPSULE | Freq: Every day | ORAL | Status: DC

## 2015-06-29 NOTE — Assessment & Plan Note (Signed)
Monitored by PCP

## 2015-06-29 NOTE — Progress Notes (Signed)
PCP: [facility]  Clinic Note: Chief Complaint  Patient presents with  . Follow-up    Hypertension; edema    HPI: [name] is a 40 y.o. female with a PMH below who presents today for What amounts to be an 18 month follow-up for hypertension.  [name] was last seen in October 2015. Her blood pressure that time was borderline. She was converted from beta blocker to calcium channel blocker due to fatigue on beta blocker. We then added HCTZ because there is some edema as well. At that time she was out of her HCTZ.  Recent Hospitalizations: None  Studies Reviewed: None  Interval History: He presents today feeling well. Simply returning for routine follow-up. She has no complaints or concerns. She has noted some heavy flows and is currently on her period. She is now started taking prenatal vitamins for iron supplementation. They suggested she wear NuvaRing, but she declined. The cardiac standpoint she has been stable. She denies any resting or exertional dyspnea or chest pain. No heart failure symptoms of PND, orthopnea or edema. Rare palpitations but no lightheadedness, dizziness, weakness or syncope/near syncope. No TIA/amaurosis fugax symptoms. No claudication.  ROS: A comprehensive was performed. Review of Systems  Constitutional: Negative for malaise/fatigue.  HENT: Negative for nosebleeds.   Respiratory: Negative for cough, shortness of breath and wheezing.   Cardiovascular: Negative for claudication.  Gastrointestinal: Negative for heartburn, constipation, blood in stool and melena.  Genitourinary: Positive for frequency. Negative for dysuria, hematuria and flank pain.       Menorrhagia  Musculoskeletal: Negative.  Negative for myalgias, joint pain and falls.  Neurological: Negative for dizziness, sensory change, speech change, focal weakness, seizures and loss of consciousness.  Endo/Heme/Allergies: Does not bruise/bleed easily.  Psychiatric/Behavioral:  Negative for depression and memory loss. The patient is not nervous/anxious and does not have insomnia.   All other systems reviewed and are negative.   Past Medical History  Diagnosis Date  . Essential hypertension   . Trichomonal infection   . Syphilis   . Fibroids 2012  . Heart palpitations     Noted to have PVCs and no arrhythmia on cardiac monitor -not taking beta blocker;  ECHO December 2013: EF 55-60% with mild LA ablation. Otherwise normal.    Past Surgical History  Procedure Laterality Date  . Gallbladder surgery    . Eye surgery    . Cholecystectomy      Prior to Admission medications   Medication Sig Start Date End Date Taking? Authorizing Provider  diltiazem (CARDIZEM CD) 240 MG 24 hr capsule Take 1 capsule (240 mg total) by mouth daily. Keep appointment. 04/16/15  Yes Leonie Man, MD  hydrochlorothiazide (HYDRODIURIL) 25 MG tablet Take 1 tablet (25 mg total) by mouth daily. 04/16/15  Yes Leonie Man, MD  loratadine (CLARITIN) 10 MG tablet Take 1 tablet (10 mg total) by mouth daily. Patient taking differently: Take 10 mg by mouth as needed.  02/21/15  Yes Benjamin Cartner, PA-C  metroNIDAZOLE (METROGEL VAGINAL) 0.75 % vaginal gel Place 1 Applicatorful vaginally 2 (two) times daily. 05/20/15  Yes Shelly Bombard, MD  Prenatal Vit-Fe Fumarate-FA (MULTIVITAMIN-PRENATAL) 27-0.8 MG TABS tablet Take 1 tablet by mouth daily at 12 noon.   Yes Historical Provider, MD   No Known Allergies   Social History   Social History  . Marital Status: Single    Spouse Name: N/A  . Number of Children: N/A  . Years of Education: N/A  Social History Main Topics  . Smoking status: Current Every Day Smoker -- 0.25 packs/day    Types: Cigarettes    Start date: 05/29/2003  . Smokeless tobacco: Never Used     Comment: smoke about 4 a day  . Alcohol Use: 1.2 oz/week    2 Standard drinks or equivalent per week     Comment: Occasionaly   . Drug Use: No  . Sexual Activity:     Partners: Male    Birth Control/ Protection: None   Other Topics Concern  . None   Social History Narrative   Single. Still smokes maybe a quarter of a pack a day. Does not take alcohol or caffeine to   Family History  Problem Relation Age of Onset  . Hypertension Father   . Early death Father   . Diabetes Mother      Wt Readings from Last 3 Encounters:  06/29/15 159 lb 9.6 oz (72.394 kg)  05/20/15 161 lb (73.029 kg)  02/21/15 160 lb 8 oz (72.802 kg)    PHYSICAL EXAM BP 116/84 mmHg  Pulse 70  Ht 5' (1.524 m)  Wt 159 lb 9.6 oz (72.394 kg)  BMI 31.17 kg/m2 General appearance: alert, cooperative, appears stated age, no distress and borderline obese Neck: no adenopathy, no carotid bruit and no JVD Lungs: clear to auscultation bilaterally, normal percussion bilaterally and non-labored Heart: regular rate and rhythm, S1 And S2 normal, no click, rub or gallop; nondisplaced PMI. 1-2/6 C-D SEM at Abdomen: soft, non-tender; bowel sounds normal; no masses,  no organomegaly; no HJR Extremities: extremities normal, atraumatic, no cyanosis, or edema  Pulses: 2+ and symmetric; Skin: mobility and turgor normal; no lesions or rashes. Neurologic: Mental status: Alert, oriented, thought content appropriate Cranial nerves: normal (II-XII grossly intact)    Adult ECG Report  Rate: 70 ;  Rhythm: normal sinus rhythm and Nonspecific ST and T-wave flattening in inferior and lateral leads.;   Narrative Interpretation: Stable EKG   Other studies Reviewed: Additional studies/ records that were reviewed today include:  Recent Labs:  Monitored by PCP    ASSESSMENT / PLAN: Counseled on smoking cessation. Not ready to quit  Problem List Items Addressed This Visit    Hyperlipidemia with target LDL less than 100 (Chronic)    Monitored by PCP      Relevant Medications   diltiazem (CARDIZEM CD) 240 MG 24 hr capsule   hydrochlorothiazide (HYDRODIURIL) 25 MG tablet   Other Relevant Orders    EKG 12-Lead   Heart palpitations (Chronic)    Well-controlled on calcium channel blocker.      Relevant Orders   EKG 12-Lead   Essential hypertension - Primary (Chronic)    Well-controlled today on current meds. Continue current meds as ordered. Refill if necessary. HCTZ has helped edema and pressure.      Relevant Medications   diltiazem (CARDIZEM CD) 240 MG 24 hr capsule   hydrochlorothiazide (HYDRODIURIL) 25 MG tablet   Other Relevant Orders   EKG 12-Lead   Aortic heart murmur on examination    No significant lesions noted on previous echo. I suspect this is probably a flow murmur related to her being on her menstrual cycle.         Current medicines are reviewed at length with the patient today. (+/- concerns) None The following changes have been made: None Studies Ordered:   Orders Placed This Encounter  Procedures  . EKG 12-Lead    ROV 18 month   HARDING,  Leonie Green, M.D., M.S. Interventional Cardiologist   Pager # [phone] Phone # [phone] [address] [place], [place] [address]

## 2015-06-29 NOTE — Assessment & Plan Note (Signed)
Well-controlled today on current meds. Continue current meds as ordered. Refill if necessary. HCTZ has helped edema and pressure.

## 2015-06-29 NOTE — Assessment & Plan Note (Signed)
Well-controlled on calcium channel blocker.

## 2015-06-29 NOTE — Patient Instructions (Signed)
No change with current medications.   Your physician wants you to follow-up in18 month with Dr Ellyn Hack.  You will receive a reminder letter in the mail two months in advance. If you don't receive a letter, please call our office to schedule the follow-up appointment.  If you need a refill on your cardiac medications before your next appointment, please call your pharmacy.

## 2015-06-29 NOTE — Assessment & Plan Note (Signed)
No significant lesions noted on previous echo. I suspect this is probably a flow murmur related to her being on her menstrual cycle.

## 2015-08-18 ENCOUNTER — Other Ambulatory Visit: Payer: Self-pay | Admitting: Obstetrics

## 2015-08-18 DIAGNOSIS — R921 Mammographic calcification found on diagnostic imaging of breast: Secondary | ICD-10-CM

## 2015-09-04 ENCOUNTER — Ambulatory Visit
Admission: RE | Admit: 2015-09-04 | Discharge: 2015-09-04 | Disposition: A | Discharge status: Home / Self Care | Payer: BLUE CROSS/BLUE SHIELD | Source: Ambulatory Visit | Attending: Obstetrics | Admitting: Obstetrics

## 2015-09-04 DIAGNOSIS — R921 Mammographic calcification found on diagnostic imaging of breast: Secondary | ICD-10-CM

## 2015-09-17 ENCOUNTER — Ambulatory Visit: Payer: BLUE CROSS/BLUE SHIELD | Admitting: Obstetrics

## 2015-09-17 MED ORDER — PRENATE MINI 29-0.6-0.4-350 MG PO CAPS: 1.0000 | ORAL_CAPSULE | Freq: Every day | ORAL | Status: DC

## 2015-09-17 MED ORDER — OMEPRAZOLE 20 MG PO CPDR: 20.0000 mg | DELAYED_RELEASE_CAPSULE | Freq: Every day | ORAL | Status: DC

## 2015-09-18 ENCOUNTER — Telehealth: Payer: Self-pay

## 2015-09-18 NOTE — Progress Notes (Signed)
No show

## 2015-09-18 NOTE — Telephone Encounter (Signed)
Need to call patient regarding her u/s

## 2015-09-23 ENCOUNTER — Other Ambulatory Visit: Payer: Self-pay | Admitting: *Deleted

## 2015-09-23 DIAGNOSIS — Z3687 Encounter for antenatal screening for uncertain dates: Secondary | ICD-10-CM

## 2015-10-01 ENCOUNTER — Other Ambulatory Visit (HOSPITAL_COMMUNITY): Payer: BLUE CROSS/BLUE SHIELD

## 2015-11-04 ENCOUNTER — Encounter: Payer: Self-pay | Admitting: Obstetrics

## 2015-11-04 ENCOUNTER — Ambulatory Visit (INDEPENDENT_AMBULATORY_CARE_PROVIDER_SITE_OTHER): Payer: BLUE CROSS/BLUE SHIELD | Admitting: Obstetrics

## 2015-11-04 VITALS — BP 144/89 | HR 89 | Ht 60.0 in

## 2015-11-04 DIAGNOSIS — Z01419 Encounter for gynecological examination (general) (routine) without abnormal findings: Secondary | ICD-10-CM

## 2015-11-04 DIAGNOSIS — Z3202 Encounter for pregnancy test, result negative: Secondary | ICD-10-CM | POA: Diagnosis not present

## 2015-11-04 DIAGNOSIS — N760 Acute vaginitis: Secondary | ICD-10-CM | POA: Diagnosis not present

## 2015-11-04 DIAGNOSIS — B9689 Other specified bacterial agents as the cause of diseases classified elsewhere: Secondary | ICD-10-CM

## 2015-11-04 DIAGNOSIS — D251 Intramural leiomyoma of uterus: Secondary | ICD-10-CM

## 2015-11-04 LAB — POCT URINALYSIS DIPSTICK
Bilirubin, UA: NEGATIVE
Blood, UA: 250
Glucose, UA: NEGATIVE
KETONES UA: NEGATIVE
NITRITE UA: NEGATIVE
Spec Grav, UA: 1.02
UROBILINOGEN UA: NEGATIVE
pH, UA: 5

## 2015-11-04 LAB — POCT URINE PREGNANCY: Preg Test, Ur: NEGATIVE

## 2015-11-04 MED ORDER — FLUOCINONIDE 0.05 % EX CREA: 1.0000 "application " | TOPICAL_CREAM | Freq: Two times a day (BID) | CUTANEOUS | 1 refills | Status: DC

## 2015-11-04 MED ORDER — CLINDAMYCIN HCL 300 MG PO CAPS: 300.0000 mg | ORAL_CAPSULE | Freq: Two times a day (BID) | ORAL | 5 refills | Status: DC

## 2015-11-04 MED ORDER — FLUCONAZOLE 150 MG PO TABS: 150.0000 mg | ORAL_TABLET | Freq: Once | ORAL | 5 refills | Status: AC

## 2015-11-04 NOTE — Addendum Note (Signed)
Addended by: Lewie Loron D on: 11/04/2015 03:28 PM   Modules accepted: Orders

## 2015-11-04 NOTE — Progress Notes (Signed)
Patient ID: [name], female   DOB: May 01, 1975, 40 y.o.   MRN: [id number]   Subjective:        [name] is a 40 y.o. female here for a routine exam.  Current complaints: Recurrent BV.  Has been using MetroGel.  Personal health questionnaire:  Is patient Ashkenazi Jewish, have a family history of breast and/or ovarian cancer: no Is there a family history of uterine cancer diagnosed at age < 42, gastrointestinal cancer, urinary tract cancer, family member who is a Field seismologist syndrome-associated carrier: no Is the patient overweight and hypertensive, family history of diabetes, personal history of gestational diabetes, preeclampsia or PCOS: no Is patient over 31, have PCOS,  family history of premature CHD under age 57, diabetes, smoke, have hypertension or peripheral artery disease:  no At any time, has a partner hit, kicked or otherwise hurt or frightened you?: no Over the past 2 weeks, have you felt down, depressed or hopeless?: no Over the past 2 weeks, have you felt little interest or pleasure in doing things?:no   Gynecologic History Patient's last menstrual period was 10/12/2015. Contraception: none Last Pap: 2015. Results were: normal Last mammogram: 2017. Results were: normal  Obstetric History OB History  Gravida Para Term Preterm AB Living  2 2 2     2   SAB TAB Ectopic Multiple Live Births          2    # Outcome Date GA Lbr Len/2nd Weight Sex Delivery Anes PTL Lv  2 Term 08/24/04 [redacted]w[redacted]d  7 lb 12 oz (3.515 kg) F Vag-Spont EPI  LIV  1 Term 03/30/96 [redacted]w[redacted]d  6 lb 15 oz (3.147 kg) M Vag-Spont None  LIV      Past Medical History:  Diagnosis Date  . Essential hypertension   . Fibroids 2012  . Heart palpitations    Noted to have PVCs and no arrhythmia on cardiac monitor -not taking beta blocker;  ECHO December 2013: EF 55-60% with mild LA ablation. Otherwise normal.  . Syphilis   . Trichomonal infection     Past Surgical History:  Procedure Laterality Date  .  CHOLECYSTECTOMY    . EYE SURGERY    . GALLBLADDER SURGERY       Current Outpatient Prescriptions:  .  diltiazem (CARDIZEM CD) 240 MG 24 hr capsule, Take 1 capsule (240 mg total) by mouth daily., Disp: 90 capsule, Rfl: 3 .  hydrochlorothiazide (HYDRODIURIL) 25 MG tablet, Take 1 tablet (25 mg total) by mouth daily., Disp: 90 tablet, Rfl: 3 .  loratadine (CLARITIN) 10 MG tablet, Take 1 tablet (10 mg total) by mouth daily. (Patient taking differently: Take 10 mg by mouth as needed. ), Disp: 30 tablet, Rfl: 0 .  Prenat w/o A-FeCbn-Meth-FA-DHA (PRENATE MINI) 29-0.6-0.4-350 MG CAPS, Take 1 capsule by mouth daily before breakfast., Disp: 60 capsule, Rfl: 5 .  metroNIDAZOLE (METROGEL VAGINAL) 0.75 % vaginal gel, Place 1 Applicatorful vaginally 2 (two) times daily., Disp: 70 g, Rfl: 4 .  omeprazole (PRILOSEC) 20 MG capsule, Take 1 capsule (20 mg total) by mouth daily. (Patient not taking: Reported on 11/04/2015), Disp: 60 capsule, Rfl: 5 No Known Allergies  Social History  Substance Use Topics  . Smoking status: Current Every Day Smoker    Packs/day: 0.25    Types: Cigarettes    Start date: 05/29/2003  . Smokeless tobacco: Never Used     Comment: smoke about 4 a day  . Alcohol use 1.2 oz/week    2  Standard drinks or equivalent per week     Comment: Occasionaly     Family History  Problem Relation Age of Onset  . Hypertension Father   . Early death Father   . Diabetes Mother       Review of Systems  Constitutional: negative for fatigue and weight loss Respiratory: negative for cough and wheezing Cardiovascular: negative for chest pain, fatigue and palpitations Gastrointestinal: negative for abdominal pain and change in bowel habits Musculoskeletal:negative for myalgias Neurological: negative for gait problems and tremors Behavioral/Psych: negative for abusive relationship, depression Endocrine: negative for temperature intolerance   Genitourinary:positive for sometimes heavy irregular  periods.  Recurrent malodorous vaginal discharge Integument/breast: negative for breast lump, breast tenderness, nipple discharge and skin lesion(s)    Objective:       BP (!) 144/89   Pulse 89   Ht 5' (1.524 m)   LMP 10/12/2015    PE:        General:  Alert and no distress      Lungs:  Clear      Heart:  RRR      Breasts:  No masses, tenderness      Vagina / Cervix:  Normal.  No abnormal mucosa, lesions, discharge or bleeding      Uterus:  10 weeks size, irregular contour, non tender.      Ovaries:  No masses, tenderness  Lab Review Urine pregnancy test Labs reviewed yes Radiologic studies reviewed yes    Assessment:    Healthy female exam.    H/O Uterine Fibroids.  Stable.  Recurrent BV    Plan:     Will follow fibroids clinically  Stop MetroGel  Clindamycin Rx monthly 300mg  bid x 6 months.  Education reviewed: calcium supplements, low fat, low cholesterol diet, safe sex/STD prevention, self breast exams and weight bearing exercise. Follow up in: 6 months.   No orders of the defined types were placed in this encounter.  Orders Placed This Encounter  Procedures  . POCT urine pregnancy

## 2015-11-06 LAB — PAP IG AND HPV HIGH-RISK
HPV, high-risk: NEGATIVE
PAP Smear Comment: 0

## 2015-11-06 LAB — URINE CULTURE

## 2015-11-07 ENCOUNTER — Other Ambulatory Visit: Payer: Self-pay | Admitting: Obstetrics

## 2015-11-07 LAB — NUSWAB VG+, CANDIDA 6SP
ATOPOBIUM VAGINAE: HIGH {score} — AB
BVAB 2: HIGH Score — AB
CANDIDA ALBICANS, NAA: NEGATIVE
CANDIDA GLABRATA, NAA: NEGATIVE
CANDIDA KRUSEI, NAA: NEGATIVE
CANDIDA LUSITANIAE, NAA: NEGATIVE
CANDIDA TROPICALIS, NAA: NEGATIVE
Candida parapsilosis, NAA: NEGATIVE
Chlamydia trachomatis, NAA: NEGATIVE
Megasphaera 1: HIGH Score — AB
Neisseria gonorrhoeae, NAA: NEGATIVE
Trich vag by NAA: NEGATIVE

## 2016-04-06 ENCOUNTER — Other Ambulatory Visit: Payer: Self-pay | Admitting: Obstetrics

## 2016-04-06 DIAGNOSIS — R921 Mammographic calcification found on diagnostic imaging of breast: Secondary | ICD-10-CM

## 2016-04-19 ENCOUNTER — Inpatient Hospital Stay
Admission: RE | Admit: 2016-04-19 | Discharge: 2016-04-19 | Disposition: A | Discharge status: Home / Self Care | Payer: BLUE CROSS/BLUE SHIELD | Source: Ambulatory Visit | Attending: Obstetrics | Admitting: Obstetrics

## 2016-05-05 ENCOUNTER — Ambulatory Visit (INDEPENDENT_AMBULATORY_CARE_PROVIDER_SITE_OTHER): Payer: BLUE CROSS/BLUE SHIELD | Admitting: Obstetrics

## 2016-05-05 ENCOUNTER — Encounter: Payer: Self-pay | Admitting: *Deleted

## 2016-05-05 ENCOUNTER — Encounter: Payer: Self-pay | Admitting: Obstetrics

## 2016-05-05 VITALS — BP 132/85 | HR 83 | Wt 158.0 lb

## 2016-05-05 DIAGNOSIS — B373 Candidiasis of vulva and vagina: Secondary | ICD-10-CM | POA: Diagnosis not present

## 2016-05-05 DIAGNOSIS — B9689 Other specified bacterial agents as the cause of diseases classified elsewhere: Secondary | ICD-10-CM

## 2016-05-05 DIAGNOSIS — N760 Acute vaginitis: Secondary | ICD-10-CM

## 2016-05-05 DIAGNOSIS — B3731 Acute candidiasis of vulva and vagina: Secondary | ICD-10-CM

## 2016-05-05 MED ORDER — TINIDAZOLE 500 MG PO TABS: 1000.0000 mg | ORAL_TABLET | Freq: Every day | ORAL | 5 refills | Status: DC

## 2016-05-05 MED ORDER — TERCONAZOLE 0.4 % VA CREA: 1.0000 | TOPICAL_CREAM | Freq: Every day | VAGINAL | 2 refills | Status: DC

## 2016-05-05 MED ORDER — FLUCONAZOLE 150 MG PO TABS: 150.0000 mg | ORAL_TABLET | Freq: Once | ORAL | 2 refills | Status: AC

## 2016-05-05 NOTE — Patient Instructions (Signed)
Bacterial Vaginosis Bacterial vaginosis is a vaginal infection that occurs when the normal balance of bacteria in the vagina is disrupted. It results from an overgrowth of certain bacteria. This is the most common vaginal infection among women ages 15-44. Because bacterial vaginosis increases your risk for STIs (sexually transmitted infections), getting treated can help reduce your risk for chlamydia, gonorrhea, herpes, and HIV (human immunodeficiency virus). Treatment is also important for preventing complications in pregnant women, because this condition can cause an early (premature) delivery. What are the causes? This condition is caused by an increase in harmful bacteria that are normally present in small amounts in the vagina. However, the reason that the condition develops is not fully understood. What increases the risk? The following factors may make you more likely to develop this condition:  Having a new sexual partner or multiple sexual partners.  Having unprotected sex.  Douching.  Having an intrauterine device (IUD).  Smoking.  Drug and alcohol abuse.  Taking certain antibiotic medicines.  Being pregnant.  You cannot get bacterial vaginosis from toilet seats, bedding, swimming pools, or contact with objects around you. What are the signs or symptoms? Symptoms of this condition include:  Grey or white vaginal discharge. The discharge can also be watery or foamy.  A fish-like odor with discharge, especially after sexual intercourse or during menstruation.  Itching in and around the vagina.  Burning or pain with urination.  Some women with bacterial vaginosis have no signs or symptoms. How is this diagnosed? This condition is diagnosed based on:  Your medical history.  A physical exam of the vagina.  Testing a sample of vaginal fluid under a microscope to look for a large amount of bad bacteria or abnormal cells. Your health care provider may use a cotton swab  or a small wooden spatula to collect the sample.  How is this treated? This condition is treated with antibiotics. These may be given as a pill, a vaginal cream, or a medicine that is put into the vagina (suppository). If the condition comes back after treatment, a second round of antibiotics may be needed. Follow these instructions at home: Medicines  Take over-the-counter and prescription medicines only as told by your health care provider.  Take or use your antibiotic as told by your health care provider. Do not stop taking or using the antibiotic even if you start to feel better. General instructions  If you have a female sexual partner, tell her that you have a vaginal infection. She should see her health care provider and be treated if she has symptoms. If you have a female sexual partner, he does not need treatment.  During treatment: ? Avoid sexual activity until you finish treatment. ? Do not douche. ? Avoid alcohol as directed by your health care provider. ? Avoid breastfeeding as directed by your health care provider.  Drink enough water and fluids to keep your urine clear or pale yellow.  Keep the area around your vagina and rectum clean. ? Wash the area daily with warm water. ? Wipe yourself from front to back after using the toilet.  Keep all follow-up visits as told by your health care provider. This is important. How is this prevented?  Do not douche.  Wash the outside of your vagina with warm water only.  Use protection when having sex. This includes latex condoms and dental dams.  Limit how many sexual partners you have. To help prevent bacterial vaginosis, it is best to have sex with just   one partner (monogamous).  Make sure you and your sexual partner are tested for STIs.  Wear cotton or cotton-lined underwear.  Avoid wearing tight pants and pantyhose, especially during summer.  Limit the amount of alcohol that you drink.  Do not use any products that  contain nicotine or tobacco, such as cigarettes and e-cigarettes. If you need help quitting, ask your health care provider.  Do not use illegal drugs. Where to find more information:  Centers for Disease Control and Prevention: www.cdc.gov/std  American Sexual Health Association (ASHA): www.ashastd.org  U.S. Department of Health and Human Services, Office on Women's Health: www.womenshealth.gov/ or https://www.womenshealth.gov/a-z-topics/bacterial-vaginosis Contact a health care provider if:  Your symptoms do not improve, even after treatment.  You have more discharge or pain when urinating.  You have a fever.  You have pain in your abdomen.  You have pain during sex.  You have vaginal bleeding between periods. Summary  Bacterial vaginosis is a vaginal infection that occurs when the normal balance of bacteria in the vagina is disrupted.  Because bacterial vaginosis increases your risk for STIs (sexually transmitted infections), getting treated can help reduce your risk for chlamydia, gonorrhea, herpes, and HIV (human immunodeficiency virus). Treatment is also important for preventing complications in pregnant women, because the condition can cause an early (premature) delivery.  This condition is treated with antibiotic medicines. These may be given as a pill, a vaginal cream, or a medicine that is put into the vagina (suppository). This information is not intended to replace advice given to you by your health care provider. Make sure you discuss any questions you have with your health care provider. Document Released: 03/14/2005 Document Revised: 11/28/2015 Document Reviewed: 11/28/2015 Elsevier Interactive Patient Education  2017 Elsevier Inc.  

## 2016-05-05 NOTE — Progress Notes (Signed)
Patient ID: AUTHERINE NITKA, female   DOB: [birth year], 41 y.o.   MRN: [id number]  Chief Complaint  Patient presents with  . Gynecologic Exam    HPI JAQUESHA BOTTGER is a 41 y.o. female.  Chronic BV.  Treated in the past with Metronidazole, Clindamycin and MetroGel Vaginal HPI  Past Medical History:  Diagnosis Date  . Essential hypertension   . Fibroids 2012  . Heart palpitations    Noted to have PVCs and no arrhythmia on cardiac monitor -not taking beta blocker;  ECHO December 2013: EF 55-60% with mild LA ablation. Otherwise normal.  . Syphilis   . Trichomonal infection     Past Surgical History:  Procedure Laterality Date  . CHOLECYSTECTOMY    . EYE SURGERY    . GALLBLADDER SURGERY      Family History  Problem Relation Age of Onset  . Hypertension Father   . Early death Father   . Diabetes Mother     Social History Social History  Substance Use Topics  . Smoking status: Current Every Day Smoker    Packs/day: 0.50    Types: Cigarettes    Start date: 05/29/2003  . Smokeless tobacco: Never Used     Comment: smoke about 4 a day  . Alcohol use 1.2 oz/week    2 Standard drinks or equivalent per week     Comment: Occasionaly     No Known Allergies  Current Outpatient Prescriptions  Medication Sig Dispense Refill  . diltiazem (CARDIZEM CD) 240 MG 24 hr capsule Take 1 capsule (240 mg total) by mouth daily. 90 capsule 3  . hydrochlorothiazide (HYDRODIURIL) 25 MG tablet Take 1 tablet (25 mg total) by mouth daily. 90 tablet 3  . fluconazole (DIFLUCAN) 150 MG tablet Take 1 tablet (150 mg total) by mouth once. 1 tablet 2  . fluocinonide cream (LIDEX) [id number] % Apply 1 application topically 2 (two) times daily. (Patient not taking: Reported on 05/05/2016) 120 g 1  . loratadine (CLARITIN) 10 MG tablet Take 1 tablet (10 mg total) by mouth daily. (Patient not taking: Reported on 05/05/2016) 30 tablet 0  . metroNIDAZOLE (METROGEL VAGINAL) 0.75 % vaginal gel Place 1 Applicatorful  vaginally 2 (two) times daily. (Patient not taking: Reported on 05/05/2016) 70 g 4  . Prenat w/o A-FeCbn-Meth-FA-DHA (PRENATE MINI) 29-0.6-0.4-350 MG CAPS Take 1 capsule by mouth daily before breakfast. (Patient not taking: Reported on 05/05/2016) 60 capsule 5  . terconazole (TERAZOL 7) 0.4 % vaginal cream Place 1 applicator vaginally at bedtime. 45 g 2  . tinidazole (TINDAMAX) 500 MG tablet Take 2 tablets (1,000 mg total) by mouth daily with breakfast. 10 tablet 5   No current facility-administered medications for this visit.     Review of Systems Review of Systems Constitutional: negative for fatigue and weight loss Respiratory: negative for cough and wheezing Cardiovascular: negative for chest pain, fatigue and palpitations Gastrointestinal: negative for abdominal pain and change in bowel habits Genitourinary:positive for chronic malodorous vaginal discharge Integument/breast: negative for nipple discharge Musculoskeletal:negative for myalgias Neurological: negative for gait problems and tremors Behavioral/Psych: negative for abusive relationship, depression Endocrine: negative for temperature intolerance      Blood pressure 132/85, pulse 83, weight 158 lb (71.7 kg), last menstrual period 04/06/2016.  Physical Exam Physical Exam:  Deferred  >50% of 10 min visit spent on counseling and coordination of care.    Data Reviewed Wet prep  Assessment     Chronic BV    Plan  Tindamax Rx monthly after period for 6 months. F/U in 6 months   No orders of the defined types were placed in this encounter.  Meds ordered this encounter  Medications  . tinidazole (TINDAMAX) 500 MG tablet    Sig: Take 2 tablets (1,000 mg total) by mouth daily with breakfast.    Dispense:  10 tablet    Refill:  5  . fluconazole (DIFLUCAN) 150 MG tablet    Sig: Take 1 tablet (150 mg total) by mouth once.    Dispense:  1 tablet    Refill:  2  . terconazole (TERAZOL 7) 0.4 % vaginal cream    Sig:  Place 1 applicator vaginally at bedtime.    Dispense:  45 g    Refill:  2         Patient ID: Claudette Laws, female   DOB: 09-Jul-1975, 41 y.o.   MRN: [id number]

## 2016-05-05 NOTE — Progress Notes (Signed)
Patient is in the office for follow up to [place]. Patient has tried different treatments for BV and she is not having success.

## 2016-06-16 ENCOUNTER — Other Ambulatory Visit: Payer: Self-pay | Admitting: Obstetrics

## 2016-06-16 ENCOUNTER — Ambulatory Visit
Admission: RE | Admit: 2016-06-16 | Discharge: 2016-06-16 | Disposition: A | Discharge status: Home / Self Care | Payer: BLUE CROSS/BLUE SHIELD | Source: Ambulatory Visit | Attending: Obstetrics | Admitting: Obstetrics

## 2016-06-16 DIAGNOSIS — R921 Mammographic calcification found on diagnostic imaging of breast: Secondary | ICD-10-CM

## 2016-07-21 ENCOUNTER — Other Ambulatory Visit: Payer: Self-pay | Admitting: Cardiology

## 2016-09-22 ENCOUNTER — Encounter (HOSPITAL_COMMUNITY): Payer: Self-pay

## 2016-09-22 ENCOUNTER — Emergency Department (HOSPITAL_COMMUNITY)
Admission: EM | Admit: 2016-09-22 | Discharge: 2016-09-22 | Disposition: A | Discharge status: Home / Self Care | Payer: BLUE CROSS/BLUE SHIELD | Attending: Emergency Medicine | Admitting: Emergency Medicine

## 2016-09-22 DIAGNOSIS — M545 Low back pain, unspecified: Secondary | ICD-10-CM

## 2016-09-22 DIAGNOSIS — F17210 Nicotine dependence, cigarettes, uncomplicated: Secondary | ICD-10-CM | POA: Diagnosis not present

## 2016-09-22 DIAGNOSIS — Z79899 Other long term (current) drug therapy: Secondary | ICD-10-CM | POA: Diagnosis not present

## 2016-09-22 DIAGNOSIS — I10 Essential (primary) hypertension: Secondary | ICD-10-CM | POA: Diagnosis not present

## 2016-09-22 LAB — URINALYSIS, ROUTINE W REFLEX MICROSCOPIC
Bacteria, UA: NONE SEEN
Bilirubin Urine: NEGATIVE
Glucose, UA: NEGATIVE mg/dL
Ketones, ur: NEGATIVE mg/dL
Leukocytes, UA: NEGATIVE
Nitrite: NEGATIVE
Protein, ur: NEGATIVE mg/dL
RBC / HPF: NONE SEEN RBC/hpf (ref 0–5)
Specific Gravity, Urine: 1.006 (ref 1.005–1.030)
pH: 7 (ref 5.0–8.0)

## 2016-09-22 LAB — PREGNANCY, URINE: Preg Test, Ur: NEGATIVE

## 2016-09-22 MED ORDER — CYCLOBENZAPRINE HCL 10 MG PO TABS: 10.0000 mg | ORAL_TABLET | Freq: Every day | ORAL | 0 refills | Status: AC

## 2016-09-22 MED ORDER — KETOROLAC TROMETHAMINE 60 MG/2ML IM SOLN
15.0000 mg | Freq: Once | INTRAMUSCULAR | Status: AC
  Administered 2016-09-22: 15 mg via INTRAMUSCULAR
  Filled 2016-09-22: qty 2

## 2016-09-22 MED ORDER — ACETAMINOPHEN 500 MG PO TABS
1000.0000 mg | ORAL_TABLET | Freq: Once | ORAL | Status: AC
Start: 2016-09-22 — End: 2016-09-22
  Administered 2016-09-22: 1000 mg via ORAL
  Filled 2016-09-22: qty 2

## 2016-09-22 NOTE — ED Provider Notes (Signed)
[facility] DEPT Provider Note    By signing my name below, I, Bea Graff, attest that this documentation has been prepared under the direction and in the presence of [facility], PA-C. Electronically Signed: Bea Graff, ED Scribe. 09/22/16. 6:44 PM.    History   Chief Complaint Chief Complaint  Patient presents with  . Back Pain   The history is provided by the patient and medical records. No language interpreter was used.    [name] is a 41 y.o. female with PMHx of HLD and HTN who presents to the Emergency Department complaining of sudden onset, constant, worsening, non radiating, aching low back pain that began two days ago. She has taken OTC Doan's back relief tablets (Magnesium tablets) and tramadol for pain with no significant relief. Bending forward increases the pain. Lying down resting helps alleviate the pain. She denies fever, chills, abdominal pain, nausea, vomiting, urinary complaints, bruising, wounds, numbness, tingling or weakness of any extremity. She denies trauma, injury or fall. She denies any heavy lifting. She denies h/o cancer or IV drug use.   Past Medical History:  Diagnosis Date  . Essential hypertension   . Fibroids 2012  . Heart palpitations    Noted to have PVCs and no arrhythmia on cardiac monitor -not taking beta blocker;  ECHO December 2013: EF 55-60% with mild LA ablation. Otherwise normal.  . Syphilis   . Trichomonal infection     Patient Active Problem List   Diagnosis Date Noted  . Aortic heart murmur on examination 06/29/2015  . Obesity (BMI 30-39.9) 12/29/2013  . Hyperlipidemia with target LDL less than 100 12/29/2013  . Essential hypertension   . Heart palpitations   . Unspecified disorder of menstruation and other abnormal bleeding from female genital tract 05/07/2013  . Dysmenorrhea 05/07/2013  . BV (bacterial vaginosis) 05/07/2013  . Candidiasis of vulva and vagina 05/07/2013  . Leiomyoma of uterus, unspecified  04/25/2013    Past Surgical History:  Procedure Laterality Date  . CHOLECYSTECTOMY    . EYE SURGERY    . GALLBLADDER SURGERY      OB History    Gravida Para Term Preterm AB Living   2 2 2     2    SAB TAB Ectopic Multiple Live Births           2       Home Medications    Prior to Admission medications   Medication Sig Start Date End Date Taking? Authorizing Provider  CARTIA XT 240 MG 24 hr capsule take 1 capsule by mouth once daily 07/22/16   Leonie Man, MD  cyclobenzaprine (FLEXERIL) 10 MG tablet Take 1 tablet (10 mg total) by mouth at bedtime. 09/22/16 10/02/16  Rodell Perna A, PA-C  fluocinonide cream (LIDEX) 8.24 % Apply 1 application topically 2 (two) times daily. Patient not taking: Reported on 05/05/2016 11/04/15   Shelly Bombard, MD  hydrochlorothiazide (HYDRODIURIL) 25 MG tablet Take 1 tablet (25 mg total) by mouth daily. 06/29/15   Leonie Man, MD  loratadine (CLARITIN) 10 MG tablet Take 1 tablet (10 mg total) by mouth daily. Patient not taking: Reported on 05/05/2016 02/21/15   Comer Locket, PA-C  metroNIDAZOLE (METROGEL VAGINAL) 0.75 % vaginal gel Place 1 Applicatorful vaginally 2 (two) times daily. Patient not taking: Reported on 05/05/2016 05/20/15   Shelly Bombard, MD  Prenat w/o A-FeCbn-Meth-FA-DHA (PRENATE MINI) 29-0.6-0.4-350 MG CAPS Take 1 capsule by mouth daily before breakfast. Patient not taking: Reported on 05/05/2016  09/17/15   Shelly Bombard, MD  terconazole (TERAZOL 7) 0.4 % vaginal cream Place 1 applicator vaginally at bedtime. 05/05/16   Shelly Bombard, MD  tinidazole (TINDAMAX) 500 MG tablet Take 2 tablets (1,000 mg total) by mouth daily with breakfast. 05/05/16   Shelly Bombard, MD    Family History Family History  Problem Relation Age of Onset  . Hypertension Father   . Early death Father   . Diabetes Mother     Social History Social History  Substance Use Topics  . Smoking status: Current Every Day Smoker    Packs/day: 0.50     Types: Cigarettes    Start date: 05/29/2003  . Smokeless tobacco: Never Used     Comment: smoke about 4 a day  . Alcohol use 1.2 oz/week    2 Standard drinks or equivalent per week     Comment: Occasionaly      Allergies   Patient has no known allergies.   Review of Systems Review of Systems  Constitutional: Negative for chills and fever.  Gastrointestinal: Negative for abdominal pain, nausea and vomiting.       No bowel or bladder incontinence  Genitourinary: Negative for decreased urine volume, difficulty urinating, dysuria, frequency, hematuria and urgency.  Musculoskeletal: Positive for back pain.  Skin: Negative for color change and wound.  Neurological: Negative for weakness and numbness.     Physical Exam Updated Vital Signs BP 132/79 (BP Location: Left Arm)   Pulse 80   Temp 98.3 F (36.8 C) (Oral)   Resp 16   Ht 5' (1.524 m)   Wt 156 lb (70.8 kg)   LMP 09/11/2016   SpO2 100%   BMI 30.47 kg/m   Physical Exam  Constitutional: She is oriented to person, place, and time. She appears well-developed and well-nourished.  HENT:  Head: Normocephalic and atraumatic.  Neck: Normal range of motion.  Cardiovascular: Normal rate.   Pulmonary/Chest: Effort normal.  Abdominal: Soft. Bowel sounds are normal. She exhibits no distension. There is tenderness (mild suprapubic). There is CVA tenderness (bilaterally).  Musculoskeletal: Normal range of motion. She exhibits tenderness. She exhibits no edema or deformity.  No midline CSP or TSP TTP. No paraspinal muscle tenderness in these regions. Low back TTP at the levels of L4-L5 and L5-S1 with paraspinal muscle tenderness noted bilaterally. No deformity, crepitus or step off noted. No SI joint tenderness. Pain elicited with flexion of lumbar spine. No pain with extension or lateral rotation and pt has full AROM of LSP. 5/5 of BLE major muscle groups.  Neurological: She is alert and oriented to person, place, and time.  Fluent  speech, no facial droop, sensation intact to soft touch of extremities, normal gait, and patient able to heel walk and toe walk without difficulty.  Skin: Skin is warm and dry.  Psychiatric: She has a normal mood and affect. Her behavior is normal.  Nursing note and vitals reviewed.    ED Treatments / Results  DIAGNOSTIC STUDIES: Oxygen Saturation is 100% on RA, normal by my interpretation.   COORDINATION OF CARE: 5:17 PM- Will order urinalysis. Will order injection of Toradol and ice therapy. Pt verbalizes understanding and agrees to plan.  Medications  acetaminophen (TYLENOL) tablet 1,000 mg (1,000 mg Oral Given 09/22/16 1848)  ketorolac (TORADOL) injection 15 mg (15 mg Intramuscular Given 09/22/16 1812)    Labs (all labs ordered are listed, but only abnormal results are displayed) Labs Reviewed  URINALYSIS, [place] -  Abnormal; Notable for the following:       Result Value   Color, Urine STRAW (*)    Hgb urine dipstick SMALL (*)    Squamous Epithelial / LPF 0-5 (*)    All other components within normal limits  PREGNANCY, URINE    EKG  EKG Interpretation None       Radiology No results found.  Procedures Procedures (including critical care time)  Medications Ordered in ED Medications  acetaminophen (TYLENOL) tablet 1,000 mg (1,000 mg Oral Given 09/22/16 1848)  ketorolac (TORADOL) injection 15 mg (15 mg Intramuscular Given 09/22/16 1812)     Initial Impression / Assessment and Plan / ED Course  I have reviewed the triage vital signs and the nursing notes.  Pertinent labs & imaging results that were available during my care of the patient were reviewed by me and considered in my medical decision making (see chart for details).     Patient with back pain that began suddenly two days ago. No neurological deficits and normal neuro exam. Patient is ambulatory. No loss of bowel or bladder control. No concern for cauda equina.  No fever, night  sweats, h/o cancer, IVDA, no recent procedure to back. No urinary symptoms suggestive of UTI and urinalysis came back negative. Low  suspicion of pyelonephritis, UTI, or nephrolithiasis as a result. Supportive care and  Indications for return to the ED immediately discussed. Appears safe for discharge at this time. Pain alleviated with Toradol while in the ED. She will follow-up with primary care for reevaluation this week. Pt verbalized understanding of and agreement with plan and is safe for discharge home at this time.   Final Clinical Impressions(s) / ED Diagnoses   Final diagnoses:  Acute bilateral low back pain without sciatica    New Prescriptions New Prescriptions   CYCLOBENZAPRINE (FLEXERIL) 10 MG TABLET    Take 1 tablet (10 mg total) by mouth at bedtime.    I personally performed the services described in this documentation, which was scribed in my presence. The recorded information has been reviewed and is accurate.     Renita Papa, PA-C 09/22/16 Marko Stai    Gareth Morgan, MD 09/23/16 [phone]

## 2016-09-22 NOTE — ED Triage Notes (Signed)
Per Pt, Pt is coming from home with lower bilateral lumber pain that started two days ago. Pt denies injury. Denies urinary symptoms. Denies SOB.

## 2016-09-22 NOTE — Discharge Instructions (Signed)

## 2016-12-21 ENCOUNTER — Encounter: Payer: Self-pay | Admitting: Cardiology

## 2016-12-21 ENCOUNTER — Ambulatory Visit (INDEPENDENT_AMBULATORY_CARE_PROVIDER_SITE_OTHER): Payer: BLUE CROSS/BLUE SHIELD | Admitting: Cardiology

## 2016-12-21 VITALS — BP 128/72 | HR 81 | Ht 60.0 in | Wt 151.2 lb

## 2016-12-21 DIAGNOSIS — R002 Palpitations: Secondary | ICD-10-CM | POA: Diagnosis not present

## 2016-12-21 DIAGNOSIS — I83813 Varicose veins of bilateral lower extremities with pain: Secondary | ICD-10-CM | POA: Diagnosis not present

## 2016-12-21 DIAGNOSIS — I10 Essential (primary) hypertension: Secondary | ICD-10-CM

## 2016-12-21 DIAGNOSIS — I358 Other nonrheumatic aortic valve disorders: Secondary | ICD-10-CM

## 2016-12-21 MED ORDER — DILTIAZEM HCL ER COATED BEADS 180 MG PO CP24: 180.0000 mg | ORAL_CAPSULE | Freq: Every day | ORAL | 3 refills | Status: DC

## 2016-12-21 MED ORDER — HYDROCHLOROTHIAZIDE 25 MG PO TABS: 25.0000 mg | ORAL_TABLET | Freq: Every day | ORAL | 3 refills | Status: DC

## 2016-12-21 NOTE — Patient Instructions (Signed)
MEDICATION  DILTIAZEM  180 MG ONE TABLET DAILY   SCHEDULE AT [facility] [address] Your physician has requested that you have an echocardiogram. Echocardiography is a painless test that uses sound waves to create images of your heart. It provides your doctor with information about the size and shape of your heart and how well your heart's chambers and valves are working. This procedure takes approximately one hour. There are no restrictions for this procedure.   Your physician wants you to follow-up in [place].You will receive a reminder letter in the mail two months in advance. If you don't receive a letter, please call our office to schedule the follow-up appointment.

## 2016-12-21 NOTE — Progress Notes (Addendum)
PCP: Nolene Ebbs, MD  Clinic Note: No chief complaint on file.   HPI: [name] is a 41 y.o. female with a PMH below who presents today for essentially 18 month follow-up for hypertension and palpitations. She has been maintained with diltiazem and HCTZ.Claudette Laws was last seen on 06/29/2015  Recent Hospitalizations: ER visit for back pain in June 2018  Studies Personally Reviewed - (if available, images/films reviewed: From Epic Chart or Care Everywhere)  none  Interval History: Janaki presents today overall for follow-up doing well with no major complaints. She is low but concerned because she ran out of the 240 mg a cardiac event we had her on it is taken 180 mg that her PCP gave her. Otherwise she is doing fine. She denies any headaches or blurred vision. No PND, orthopnea but does have some mild ankle edema. She still a little bit dizzy when she first wakes up in the morning, but otherwise is doing fine. No rapid regular heart palpitations.   No chest pain or shortness of breath with rest or exertion. No lightheadedness, dizziness, weakness or syncope/near syncope. No TIA/amaurosis fugax symptoms. No claudication.  ROS: A comprehensive was performed. Review of Systems  Constitutional: Negative for malaise/fatigue.  HENT: Negative for nosebleeds.   Respiratory: Negative for cough, shortness of breath and wheezing.   Gastrointestinal: Negative for blood in stool and melena.  Genitourinary: Negative for hematuria.  Psychiatric/Behavioral: Negative for memory loss. The patient is not nervous/anxious.   All other systems reviewed and are negative.   have reviewed and (if needed) personally updated the patient's problem list, medications, allergies, past medical and surgical history, social and family history.   Past Medical History:  Diagnosis Date  . Essential hypertension   . Fibroids 2012  . Heart palpitations    Noted to have PVCs and no arrhythmia  on cardiac monitor -not taking beta blocker;  ECHO December 2013: EF 55-60% with mild LA ablation. Otherwise normal.  . Syphilis   . Trichomonal infection     Past Surgical History:  Procedure Laterality Date  . CHOLECYSTECTOMY    . EYE SURGERY    . GALLBLADDER SURGERY      Current Meds  Medication Sig  . fluocinonide cream (LIDEX) 3.55 % Apply 1 application topically 2 (two) times daily.  . hydrochlorothiazide (HYDRODIURIL) 25 MG tablet Take 1 tablet (25 mg total) by mouth daily.  Marland Kitchen loratadine (CLARITIN) 10 MG tablet Take 1 tablet (10 mg total) by mouth daily.  . metroNIDAZOLE (METROGEL VAGINAL) 0.75 % vaginal gel Place 1 Applicatorful vaginally 2 (two) times daily.  . Prenat w/o A-FeCbn-Meth-FA-DHA (PRENATE MINI) 29-0.6-0.4-350 MG CAPS Take 1 capsule by mouth daily before breakfast.  . terconazole (TERAZOL 7) 0.4 % vaginal cream Place 1 applicator vaginally at bedtime.  Marland Kitchen tinidazole (TINDAMAX) 500 MG tablet Take 2 tablets (1,000 mg total) by mouth daily with breakfast.  . [DISCONTINUED] CARTIA XT 240 MG 24 hr capsule take 1 capsule by mouth once daily  . [DISCONTINUED] hydrochlorothiazide (HYDRODIURIL) 25 MG tablet Take 1 tablet (25 mg total) by mouth daily.    No Known Allergies  Social History   Social History  . Marital status: Single    Spouse name: N/A  . Number of children: N/A  . Years of education: N/A   Social History Main Topics  . Smoking status: Current Every Day Smoker    Packs/day: 0.50    Types: Cigarettes    Start date:  05/29/2003  . Smokeless tobacco: Never Used     Comment: smoke about 4 a day  . Alcohol use 1.2 oz/week    2 Standard drinks or equivalent per week     Comment: Occasionaly   . Drug use: No  . Sexual activity: Yes    Partners: Male    Birth control/ protection: None   Other Topics Concern  . None   Social History Narrative   Single. Still smokes maybe a quarter of a pack a day. Does not take alcohol or caffeine to    family  history includes Diabetes in her mother; Early death in her father; Hypertension in her father.  Wt Readings from Last 3 Encounters:  12/21/16 151 lb 3.2 oz (68.6 kg)  09/22/16 156 lb (70.8 kg)  05/05/16 158 lb (71.7 kg)    PHYSICAL EXAM BP 128/72   Pulse 81   Ht 5' (1.524 m)   Wt 151 lb 3.2 oz (68.6 kg)   BMI 29.53 kg/m  Physical Exam  Constitutional: She is oriented to person, place, and time. She appears well-developed and well-nourished. No distress.  HENT:  Head: Normocephalic and atraumatic.  Mouth/Throat: No oropharyngeal exudate.  Eyes: EOM are normal.  Neck: Normal range of motion. Neck supple. No hepatojugular reflux and no JVD present. Carotid bruit is not present.  Cardiovascular: Normal rate, regular rhythm and intact distal pulses.  Exam reveals no gallop and no friction rub.   Murmur (2/6 C-D SEM @ [facility]) heard. Pulmonary/Chest: Effort normal and breath sounds normal. No respiratory distress. She has no wheezes. She has no rales. She exhibits no tenderness.  Abdominal: Soft. Bowel sounds are normal. She exhibits no distension. There is no tenderness. There is no rebound and no guarding.  Musculoskeletal: Normal range of motion. She exhibits edema (minimal edema, + BLE varicose veins - not large . but + tender).  Neurological: She is alert and oriented to person, place, and time.  Skin: Skin is warm and dry. No rash noted. No erythema.  Psychiatric: She has a normal mood and affect. Her behavior is normal. Judgment and thought content normal.  Nursing note and vitals reviewed.   Adult ECG Report  Rate: 81 ;  Rhythm: normal sinus rhythm and Normal axis, intervals and durations;   Narrative Interpretation: stable EKG    Other studies Reviewed: Additional studies/ records that were reviewed today include:  Recent Labs:  n/a  No results found for: CHOL, HDL, LDLCALC, LDLDIRECT, TRIG, CHOLHDL    ASSESSMENT / PLAN: Problem List Items Addressed This Visit    Aortic  heart murmur on examination    I heard a murmur again today. Thought content potentially related to a flow murmur however she denies beingon her menstrual cycle. We'll recheck an echocardiogram just to ensure no structural abnormalities. I suspect that she may be developing some mild aortic sclerosis.      Relevant Orders   EKG 12-Lead (Completed)   ECHOCARDIOGRAM COMPLETE   Essential hypertension (Chronic)    Relatively well-controlled on current dose of diltiazem at 180 mg.' She is also HCTZ which helps with edema.      Relevant Medications   diltiazem (CARDIZEM CD) 180 MG 24 hr capsule   hydrochlorothiazide (HYDRODIURIL) 25 MG tablet   Heart palpitations - Primary (Chronic)    Well-controlled on current dose of calcium channel blocker. We will see how she does on lower dose.      Relevant Orders   EKG 12-Lead (Completed)  ECHOCARDIOGRAM COMPLETE   Varicose veins of bilateral lower extremities with pain    Painful varicose vein that she mentioned at the very end of the clinic visit. These seem to be much more thigh and knee area related to varicose veins. Recommended using support stockings and foot elevation for now. If it continued bothersome, we can check for venous stasis and consider vein ablation.      Relevant Medications   diltiazem (CARDIZEM CD) 180 MG 24 hr capsule   hydrochlorothiazide (HYDRODIURIL) 25 MG tablet      Current medicines are reviewed at length with the patient today. (+/- concerns) - ? Dose of Diltiazem The following changes have been made: - 180 mg Diltiazem.  Patient Instructions  MEDICATION  DILTIAZEM  180 MG ONE TABLET DAILY   SCHEDULE AT [facility] [address] Your physician has requested that you have an echocardiogram. Echocardiography is a painless test that uses sound waves to create images of your heart. It provides your doctor with information about the size and shape of your heart and how well your heart's chambers and  valves are working. This procedure takes approximately one hour. There are no restrictions for this procedure.   Your physician wants you to follow-up in [place].You will receive a reminder letter in the mail two months in advance. If you don't receive a letter, please call our office to schedule the follow-up appointment.    Studies Ordered:   Orders Placed This Encounter  Procedures  . EKG 12-Lead  . ECHOCARDIOGRAM COMPLETE      Glenetta Hew, M.D., M.S. Interventional Cardiologist   Pager # [phone] Phone # [phone] [address] [place], [place] [address]

## 2016-12-25 DIAGNOSIS — I83813 Varicose veins of bilateral lower extremities with pain: Secondary | ICD-10-CM | POA: Insufficient documentation

## 2016-12-25 NOTE — Assessment & Plan Note (Signed)
I heard a murmur again today. Thought content potentially related to a flow murmur however she denies beingon her menstrual cycle. We'll recheck an echocardiogram just to ensure no structural abnormalities. I suspect that she may be developing some mild aortic sclerosis.

## 2016-12-25 NOTE — Assessment & Plan Note (Signed)
Painful varicose vein that she mentioned at the very end of the clinic visit. These seem to be much more thigh and knee area related to varicose veins. Recommended using support stockings and foot elevation for now. If it continued bothersome, we can check for venous stasis and consider vein ablation.

## 2016-12-25 NOTE — Assessment & Plan Note (Signed)
Well-controlled on current dose of calcium channel blocker. We will see how she does on lower dose.

## 2016-12-25 NOTE — Assessment & Plan Note (Signed)
Relatively well-controlled on current dose of diltiazem at 180 mg.' She is also HCTZ which helps with edema.

## 2016-12-27 ENCOUNTER — Telehealth: Payer: Self-pay | Admitting: Cardiology

## 2016-12-27 NOTE — Telephone Encounter (Signed)
Called patient and LVM to call and schedule her echo. ST

## 2017-05-04 ENCOUNTER — Encounter: Payer: Self-pay | Admitting: Obstetrics

## 2017-05-04 ENCOUNTER — Ambulatory Visit: Payer: BLUE CROSS/BLUE SHIELD | Admitting: Obstetrics

## 2017-05-04 VITALS — BP 146/83 | HR 73 | Wt 156.3 lb

## 2017-05-04 DIAGNOSIS — Z01419 Encounter for gynecological examination (general) (routine) without abnormal findings: Secondary | ICD-10-CM | POA: Diagnosis not present

## 2017-05-04 DIAGNOSIS — M62838 Other muscle spasm: Secondary | ICD-10-CM

## 2017-05-04 DIAGNOSIS — Z113 Encounter for screening for infections with a predominantly sexual mode of transmission: Secondary | ICD-10-CM | POA: Diagnosis not present

## 2017-05-04 DIAGNOSIS — N939 Abnormal uterine and vaginal bleeding, unspecified: Secondary | ICD-10-CM

## 2017-05-04 DIAGNOSIS — Z1151 Encounter for screening for human papillomavirus (HPV): Secondary | ICD-10-CM | POA: Diagnosis not present

## 2017-05-04 DIAGNOSIS — Z124 Encounter for screening for malignant neoplasm of cervix: Secondary | ICD-10-CM | POA: Diagnosis not present

## 2017-05-04 DIAGNOSIS — D251 Intramural leiomyoma of uterus: Secondary | ICD-10-CM

## 2017-05-04 DIAGNOSIS — R102 Pelvic and perineal pain: Secondary | ICD-10-CM

## 2017-05-04 MED ORDER — CYCLOBENZAPRINE HCL 10 MG PO TABS: 10.0000 mg | ORAL_TABLET | Freq: Three times a day (TID) | ORAL | 1 refills | Status: DC | PRN

## 2017-05-04 MED ORDER — IBUPROFEN 800 MG PO TABS: 800.0000 mg | ORAL_TABLET | Freq: Three times a day (TID) | ORAL | 5 refills | Status: DC | PRN

## 2017-05-04 MED ORDER — PRENATAL PLUS IRON 29-1 MG PO TABS: 1.0000 | ORAL_TABLET | Freq: Every day | ORAL | 11 refills | Status: DC

## 2017-05-04 NOTE — Progress Notes (Signed)
Patient ID: [name], female   DOB: 03/22/1976, 42 y.o.   MRN: [id number]  Chief Complaint  Patient presents with  . GYN    HPI [name] is a 42 y.o. female.  Pelvic pain started 2 weeks ago, and is constant.  Period started today.  HAS A HISTORY OF UTERINE FIBROIDS.  Recently started a new exercise program, and the pain started after this program. HPI  Past Medical History:  Diagnosis Date  . Essential hypertension   . Fibroids 2012  . Heart palpitations    Noted to have PVCs and no arrhythmia on cardiac monitor -not taking beta blocker;  ECHO December 2013: EF 55-60% with mild LA ablation. Otherwise normal.  . Syphilis   . Trichomonal infection     Past Surgical History:  Procedure Laterality Date  . CHOLECYSTECTOMY    . EYE SURGERY    . GALLBLADDER SURGERY      Family History  Problem Relation Age of Onset  . Hypertension Father   . Early death Father   . Diabetes Mother     Social History Social History   Tobacco Use  . Smoking status: Current Every Day Smoker    Packs/day: 0.50    Types: Cigarettes    Start date: 05/29/2003  . Smokeless tobacco: Never Used  . Tobacco comment: smoke about 4 a day  Substance Use Topics  . Alcohol use: Yes    Alcohol/week: 1.2 oz    Types: 2 Standard drinks or equivalent per week    Comment: Occasionaly   . Drug use: No    No Known Allergies  Current Outpatient Medications  Medication Sig Dispense Refill  . diltiazem (CARDIZEM CD) 180 MG 24 hr capsule Take 1 capsule (180 mg total) by mouth daily. 90 capsule 3  . hydrochlorothiazide (HYDRODIURIL) 25 MG tablet Take 1 tablet (25 mg total) by mouth daily. 90 tablet 3  . fluocinonide cream (LIDEX) 5.42 % Apply 1 application topically 2 (two) times daily. (Patient not taking: Reported on 05/04/2017) 120 g 1  . loratadine (CLARITIN) 10 MG tablet Take 1 tablet (10 mg total) by mouth daily. (Patient not taking: Reported on 05/04/2017) 30 tablet 0  . metroNIDAZOLE  (METROGEL VAGINAL) 0.75 % vaginal gel Place 1 Applicatorful vaginally 2 (two) times daily. (Patient not taking: Reported on 05/04/2017) 70 g 4  . Prenat w/o A-FeCbn-Meth-FA-DHA (PRENATE MINI) 29-0.6-0.4-350 MG CAPS Take 1 capsule by mouth daily before breakfast. (Patient not taking: Reported on 05/04/2017) 60 capsule 5  . terconazole (TERAZOL 7) 0.4 % vaginal cream Place 1 applicator vaginally at bedtime. (Patient not taking: Reported on 05/04/2017) 45 g 2  . tinidazole (TINDAMAX) 500 MG tablet Take 2 tablets (1,000 mg total) by mouth daily with breakfast. (Patient not taking: Reported on 05/04/2017) 10 tablet 5   No current facility-administered medications for this visit.     Review of Systems Review of Systems Constitutional: negative for fatigue and weight loss Respiratory: negative for cough and wheezing Cardiovascular: negative for chest pain, fatigue and palpitations Gastrointestinal: negative for abdominal pain and change in bowel habits Genitourinary:positive for pelvic pain Integument/breast: negative for nipple discharge Musculoskeletal:negative for myalgias Neurological: negative for gait problems and tremors Behavioral/Psych: negative for abusive relationship, depression Endocrine: negative for temperature intolerance      Blood pressure (!) 146/83, pulse 73, weight 156 lb 4.8 oz (70.9 kg), last menstrual period 05/04/2017.  Physical Exam Physical Exam General:   alert  Skin:   no rash  or abnormalities  Lungs:   clear to auscultation bilaterally  Heart:   regular rate and rhythm, S1, S2 normal, no murmur, click, rub or gallop  Breasts:   normal without suspicious masses, skin or nipple changes or axillary nodes  Abdomen:  normal findings: no organomegaly, soft, non-tender and no hernia  Pelvis:  External genitalia: normal general appearance Urinary system: urethral meatus normal and bladder without fullness, nontender Vaginal: normal without tenderness, induration or  masses Cervix: normal appearance Adnexa: right adnexal tenderness Uterus: anteverted and non-tender, uterus 16-17 weeks size, non tender    50% of 15 min visit spent on counseling and coordination of care.   Data Reviewed CBC Last Ultrasound  Assessment     1. Encounter for routine gynecological examination with Papanicolaou smear of cervix Rx: - Cytology - PAP  2. Pelvic pain Rx: - US Pelvis Complete; Future - ibuprofen (ADVIL,MOTRIN) 800 MG tablet; Take 1 tablet (800 mg total) by mouth every 8 (eight) hours as needed.  Dispense: 30 tablet; Refill: 5 - US PELVIC COMPLETE WITH TRANSVAGINAL; Future - Cervicovaginal ancillary only  3. Fibroids, intramural Rx: - US Pelvis Complete; Future - US PELVIC COMPLETE WITH TRANSVAGINAL; Future  4. Abnormal uterine bleeding (AUB) Rx: - CBC - Ferritin - Transferrin - Prenatal Vit-Iron Carbonyl-FA (PRENATAL PLUS IRON) 29-1 MG TABS; Take 1 tablet by mouth daily before breakfast.  Dispense: 30 tablet; Refill: 11 - Cervicovaginal ancillary only  5. Muscle spasm Rx: - ibuprofen (ADVIL,MOTRIN) 800 MG tablet; Take 1 tablet (800 mg total) by mouth every 8 (eight) hours as needed.  Dispense: 30 tablet; Refill: 5 - cyclobenzaprine (FLEXERIL) 10 MG tablet; Take 1 tablet (10 mg total) by mouth every 8 (eight) hours as needed for muscle spasms.  Dispense: 30 tablet; Refill: 1   Plan    Follow up in 2 weeks   No orders of the defined types were placed in this encounter.  No orders of the defined types were placed in this encounter.    Shelly Bombard MD

## 2017-05-04 NOTE — Progress Notes (Signed)
Patient reports right sided pelvic pain that started 2 weeks ago, rated 7/10. LMP 05-04-17

## 2017-05-05 ENCOUNTER — Other Ambulatory Visit: Payer: Self-pay | Admitting: Obstetrics

## 2017-05-05 DIAGNOSIS — D500 Iron deficiency anemia secondary to blood loss (chronic): Secondary | ICD-10-CM

## 2017-05-05 LAB — CBC
Hematocrit: 27.9 % — ABNORMAL LOW (ref 34.0–46.6)
Hemoglobin: 8 g/dL — ABNORMAL LOW (ref 11.1–15.9)
MCH: 20.4 pg — ABNORMAL LOW (ref 26.6–33.0)
MCHC: 28.7 g/dL — ABNORMAL LOW (ref 31.5–35.7)
MCV: 71 fL — ABNORMAL LOW (ref 79–97)
PLATELETS: 444 10*3/uL — AB (ref 150–379)
RBC: 3.92 x10E6/uL (ref 3.77–5.28)
RDW: 17.5 % — AB (ref 12.3–15.4)
WBC: 7 10*3/uL (ref 3.4–10.8)

## 2017-05-05 LAB — CERVICOVAGINAL ANCILLARY ONLY
BACTERIAL VAGINITIS: POSITIVE — AB
Candida vaginitis: NEGATIVE
Chlamydia: NEGATIVE
Neisseria Gonorrhea: NEGATIVE
Trichomonas: NEGATIVE

## 2017-05-05 LAB — CYTOLOGY - PAP
Diagnosis: NEGATIVE
HPV (WINDOPATH): NOT DETECTED

## 2017-05-05 LAB — TRANSFERRIN: TRANSFERRIN: 368 mg/dL (ref 200–370)

## 2017-05-05 LAB — FERRITIN: FERRITIN: 6 ng/mL — AB (ref 15–150)

## 2017-05-05 MED ORDER — FERRALET 90 90-1 MG PO TABS
1.0000 | ORAL_TABLET | Freq: Every day | ORAL | 5 refills | Status: DC
Start: 2017-05-05 — End: 2019-02-25

## 2017-05-06 ENCOUNTER — Other Ambulatory Visit: Payer: Self-pay | Admitting: Obstetrics

## 2017-05-12 ENCOUNTER — Ambulatory Visit
Admission: RE | Admit: 2017-05-12 | Discharge: 2017-05-12 | Disposition: A | Discharge status: Home / Self Care | Payer: BLUE CROSS/BLUE SHIELD | Source: Ambulatory Visit | Attending: Obstetrics | Admitting: Obstetrics

## 2017-05-12 DIAGNOSIS — R102 Pelvic and perineal pain: Secondary | ICD-10-CM

## 2017-05-12 DIAGNOSIS — D251 Intramural leiomyoma of uterus: Secondary | ICD-10-CM

## 2017-05-18 ENCOUNTER — Encounter: Payer: Self-pay | Admitting: Obstetrics

## 2017-05-18 ENCOUNTER — Ambulatory Visit (INDEPENDENT_AMBULATORY_CARE_PROVIDER_SITE_OTHER): Payer: BLUE CROSS/BLUE SHIELD | Admitting: Obstetrics

## 2017-05-18 VITALS — BP 134/85 | HR 84 | Wt 151.3 lb

## 2017-05-18 DIAGNOSIS — N946 Dysmenorrhea, unspecified: Secondary | ICD-10-CM

## 2017-05-18 DIAGNOSIS — N760 Acute vaginitis: Secondary | ICD-10-CM | POA: Diagnosis not present

## 2017-05-18 DIAGNOSIS — Z30013 Encounter for initial prescription of injectable contraceptive: Secondary | ICD-10-CM

## 2017-05-18 DIAGNOSIS — D219 Benign neoplasm of connective and other soft tissue, unspecified: Secondary | ICD-10-CM

## 2017-05-18 DIAGNOSIS — Z3009 Encounter for other general counseling and advice on contraception: Secondary | ICD-10-CM | POA: Diagnosis not present

## 2017-05-18 DIAGNOSIS — N939 Abnormal uterine and vaginal bleeding, unspecified: Secondary | ICD-10-CM

## 2017-05-18 DIAGNOSIS — D500 Iron deficiency anemia secondary to blood loss (chronic): Secondary | ICD-10-CM

## 2017-05-18 DIAGNOSIS — Z3042 Encounter for surveillance of injectable contraceptive: Secondary | ICD-10-CM | POA: Diagnosis not present

## 2017-05-18 DIAGNOSIS — B9689 Other specified bacterial agents as the cause of diseases classified elsewhere: Secondary | ICD-10-CM | POA: Diagnosis not present

## 2017-05-18 MED ORDER — METRONIDAZOLE 0.75 % VA GEL: 1.0000 | Freq: Two times a day (BID) | VAGINAL | 4 refills | Status: DC

## 2017-05-18 MED ORDER — MEDROXYPROGESTERONE ACETATE 150 MG/ML IM SUSP
150.0000 mg | INTRAMUSCULAR | Status: DC
  Administered 2017-05-18: 150 mg via INTRAMUSCULAR

## 2017-05-18 MED ORDER — MEDROXYPROGESTERONE ACETATE 150 MG/ML IM SUSP: 150.0000 mg | INTRAMUSCULAR | 4 refills | Status: DC

## 2017-05-18 NOTE — Progress Notes (Addendum)
Administered depo and pt tolerated well .Marland Kitchen Administrations This Visit    medroxyPROGESTERone (DEPO-PROVERA) injection 150 mg    Admin Date 05/18/2017 Action Given Dose 150 mg Route Intramuscular Administered By Hinton Lovely, RN

## 2017-05-18 NOTE — Progress Notes (Signed)
Patient ID: [name], female   DOB: 06-09-1975, 42 y.o.   MRN: [id number]  Chief Complaint  Patient presents with  . Follow-up    HPI [name] is a 42 y.o. female.  History of symptomatic uterine fibroids.  Presents for ultrasound results. HPI  Past Medical History:  Diagnosis Date  . Essential hypertension   . Fibroids 2012  . Heart palpitations    Noted to have PVCs and no arrhythmia on cardiac monitor -not taking beta blocker;  ECHO December 2013: EF 55-60% with mild LA ablation. Otherwise normal.  . Syphilis   . Trichomonal infection     Past Surgical History:  Procedure Laterality Date  . CHOLECYSTECTOMY    . EYE SURGERY    . GALLBLADDER SURGERY      Family History  Problem Relation Age of Onset  . Hypertension Father   . Early death Father   . Diabetes Mother     Social History Social History   Tobacco Use  . Smoking status: Current Every Day Smoker    Packs/day: 0.50    Types: Cigarettes    Start date: 05/29/2003  . Smokeless tobacco: Never Used  . Tobacco comment: smoke about 4 a day  Substance Use Topics  . Alcohol use: Yes    Alcohol/week: 1.2 oz    Types: 2 Standard drinks or equivalent per week    Comment: Occasionaly   . Drug use: No    No Known Allergies  Current Outpatient Medications  Medication Sig Dispense Refill  . cyclobenzaprine (FLEXERIL) 10 MG tablet Take 1 tablet (10 mg total) by mouth every 8 (eight) hours as needed for muscle spasms. 30 tablet 1  . diltiazem (CARDIZEM CD) 180 MG 24 hr capsule Take 1 capsule (180 mg total) by mouth daily. 90 capsule 3  . Fe Cbn-Fe Gluc-FA-B12-C-DSS (FERRALET 90) 90-1 MG TABS Take 1 tablet by mouth daily before breakfast. 30 each 5  . fluocinonide cream (LIDEX) 8.58 % Apply 1 application topically 2 (two) times daily. (Patient not taking: Reported on 05/04/2017) 120 g 1  . hydrochlorothiazide (HYDRODIURIL) 25 MG tablet Take 1 tablet (25 mg total) by mouth daily. 90 tablet 3  . ibuprofen  (ADVIL,MOTRIN) 800 MG tablet Take 1 tablet (800 mg total) by mouth every 8 (eight) hours as needed. 30 tablet 5  . loratadine (CLARITIN) 10 MG tablet Take 1 tablet (10 mg total) by mouth daily. (Patient not taking: Reported on 05/04/2017) 30 tablet 0  . medroxyPROGESTERone (DEPO-PROVERA) 150 MG/ML injection Inject 1 mL (150 mg total) into the muscle every 3 (three) months. 1 mL 4  . metroNIDAZOLE (METROGEL VAGINAL) 0.75 % vaginal gel Place 1 Applicatorful vaginally 2 (two) times daily. 70 g 4  . Prenat w/o A-FeCbn-Meth-FA-DHA (PRENATE MINI) 29-0.6-0.4-350 MG CAPS Take 1 capsule by mouth daily before breakfast. (Patient not taking: Reported on 05/04/2017) 60 capsule 5  . Prenatal Vit-Iron Carbonyl-FA (PRENATAL PLUS IRON) 29-1 MG TABS Take 1 tablet by mouth daily before breakfast. 30 tablet 11  . terconazole (TERAZOL 7) 0.4 % vaginal cream Place 1 applicator vaginally at bedtime. (Patient not taking: Reported on 05/04/2017) 45 g 2  . tinidazole (TINDAMAX) 500 MG tablet Take 2 tablets (1,000 mg total) by mouth daily with breakfast. (Patient not taking: Reported on 05/04/2017) 10 tablet 5   No current facility-administered medications for this visit.     Review of Systems Review of Systems Constitutional: negative for fatigue and weight loss Respiratory: negative for cough  and wheezing Cardiovascular: negative for chest pain, fatigue and palpitations Gastrointestinal: negative for abdominal pain and change in bowel habits Genitourinary:positive for heavy and painful periods Integument/breast: negative for nipple discharge Musculoskeletal:negative for myalgias Neurological: negative for gait problems and tremors Behavioral/Psych: negative for abusive relationship, depression Endocrine: negative for temperature intolerance      Blood pressure 134/85, pulse 84, weight 151 lb 4.8 oz (68.6 kg), last menstrual period 05/04/2017.  Physical Exam  >50% of 15 min visit spent on counseling and coordination of  care.   Data Reviewed CBC ULTRASOUND  US PELVIC COMPLETE WITH TRANSVAGINAL (Accession [id number]) (Order [id number])  Imaging  Date: 05/12/2017 Department: [facility] Released By: Shelda Pal Authorizing: Shelly Bombard, MD  Exam Information   Status Exam Begun  Exam Ended   Final [99] 05/12/2017 10:07 AM 05/12/2017 11:06 AM  PACS Images   Show images for US PELVIC COMPLETE WITH TRANSVAGINAL  Study Result   CLINICAL DATA:  42 year old female with history of uterine fibroids presents with pelvic pain. Uncertain LMP.  EXAM: TRANSABDOMINAL AND TRANSVAGINAL ULTRASOUND OF PELVIS  TECHNIQUE: Both transabdominal and transvaginal ultrasound examinations of the pelvis were performed. Transabdominal technique was performed for global imaging of the pelvis including uterus, ovaries, adnexal regions, and pelvic cul-de-sac. It was necessary to proceed with endovaginal exam following the transabdominal exam to visualize the endometrium, myometrium and adnexa.  COMPARISON:  11/08/2012 pelvic sonogram.  FINDINGS: Uterus  Measurements: 14.4 x 9.0 x 9.4 cm. Bulky anteverted myomatous uterus, increased in size in the interval. Representative fibroids as follows:  -right anterior uterine body subserosal 5.9 x 5.1 x 5.9 cm fibroid, increased from 3.4 x 3.4 x 3.0 cm  -posterior fundal submucosal 1.8 x 1.5 cm fibroid, increased from 1.3 x 0.9 cm  -posterior uterine body intramural 3.2 x 2.5 cm fibroid, increased from 1.7 x 1.3 cm  Endometrium  Thickness: 8 mm.  No focal abnormality visualized.  Right ovary  Measurements: 1.9 x 1.0 x 1.6 cm (seen only on the transabdominal images). Normal appearance/no adnexal mass.  Left ovary  Measurements: 3.6 x 2.2 x 3.1 cm. Normal appearance/no adnexal mass.  Other findings  No abnormal free fluid.  IMPRESSION: 1. Bulky myomatous anteverted uterus. Overall uterine size  and fibroid sizes have increased in the interval as detailed. 2. No evidence of endometrial, ovarian or adnexal abnormality.   Electronically Signed   By: Ilona Sorrel M.D.   On: 05/12/2017 14:32     Results for TRINA, ASCH (MRN [id number]) as of 05/18/2017 16:16  Ref. Range 05/04/2017 11:30  Ferritin Latest Ref Range: 15 - 150 ng/mL 6 (L)  Transferrin Latest Ref Range: 200 - 370 mg/dL 368  WBC Latest Ref Range: 3.4 - 10.8 x10E3/uL 7.0  RBC Latest Ref Range: 3.77 - 5.28 x10E6/uL 3.92  Hemoglobin Latest Ref Range: 11.1 - 15.9 g/dL 8.0 (L)  HCT Latest Ref Range: 34.0 - 46.6 % 27.9 (L)  MCV Latest Ref Range: 79 - 97 fL 71 (L)  MCH Latest Ref Range: 26.6 - 33.0 pg 20.4 (L)  MCHC Latest Ref Range: 31.5 - 35.7 g/dL 28.7 (L)  RDW Latest Ref Range: 12.3 - 15.4 % 17.5 (H)  Platelets Latest Ref Range: 150 - 379 x10E3/uL 444 (H)   Assessment     1. Fibroids - symptomatic - small increased interval growth   2. Abnormal uterine bleeding (AUB) - L - starting Depo to shut down periods  3. Iron deficiency anemia due  to chronic blood loss  4. Dysmenorrhea - Ibuprofen Rx  5. BV (bacterial vaginosis) Rx - metroNIDAZOLE (METROGEL VAGINAL) 0.75 % vaginal gel; Place 1 Applicatorful vaginally 2 (two) times daily.  Dispense: 70 g; Refill: 4  6. Encounter for other general counseling and advice on contraception   7. Encounter for initial prescription of injectable contraceptive Rx: - medroxyPROGESTERone (DEPO-PROVERA) 150 MG/ML injection; Inject 1 mL (150 mg total) into the muscle every 3 (three) months.  Dispense: 1 mL; Refill: 4    Plan    Follow up in 6 months.  Check CBC.   No orders of the defined types were placed in this encounter.  Meds ordered this encounter  Medications  . medroxyPROGESTERone (DEPO-PROVERA) 150 MG/ML injection    Sig: Inject 1 mL (150 mg total) into the muscle every 3 (three) months.    Dispense:  1 mL    Refill:  4  . metroNIDAZOLE (METROGEL  VAGINAL) 0.75 % vaginal gel    Sig: Place 1 Applicatorful vaginally 2 (two) times daily.    Dispense:  70 g    Refill:  4     Shelly Bombard MD

## 2017-05-18 NOTE — Progress Notes (Signed)
Presents for [place] results

## 2017-06-09 ENCOUNTER — Encounter (HOSPITAL_COMMUNITY): Payer: Self-pay | Admitting: Emergency Medicine

## 2017-06-09 ENCOUNTER — Emergency Department (HOSPITAL_COMMUNITY)
Admission: EM | Admit: 2017-06-09 | Discharge: 2017-06-09 | Disposition: A | Discharge status: Home / Self Care | Payer: BLUE CROSS/BLUE SHIELD | Attending: Emergency Medicine | Admitting: Emergency Medicine

## 2017-06-09 ENCOUNTER — Other Ambulatory Visit: Payer: Self-pay

## 2017-06-09 ENCOUNTER — Emergency Department (HOSPITAL_COMMUNITY): Payer: BLUE CROSS/BLUE SHIELD

## 2017-06-09 DIAGNOSIS — I10 Essential (primary) hypertension: Secondary | ICD-10-CM | POA: Insufficient documentation

## 2017-06-09 DIAGNOSIS — F17210 Nicotine dependence, cigarettes, uncomplicated: Secondary | ICD-10-CM | POA: Diagnosis not present

## 2017-06-09 DIAGNOSIS — Z79899 Other long term (current) drug therapy: Secondary | ICD-10-CM | POA: Insufficient documentation

## 2017-06-09 DIAGNOSIS — M546 Pain in thoracic spine: Secondary | ICD-10-CM | POA: Insufficient documentation

## 2017-06-09 MED ORDER — CYCLOBENZAPRINE HCL 10 MG PO TABS: 10.0000 mg | ORAL_TABLET | Freq: Two times a day (BID) | ORAL | 0 refills | Status: DC | PRN

## 2017-06-09 MED ORDER — CYCLOBENZAPRINE HCL 10 MG PO TABS
10.0000 mg | ORAL_TABLET | Freq: Once | ORAL | Status: AC
Start: 2017-06-09 — End: 2017-06-09
  Administered 2017-06-09: 10 mg via ORAL
  Filled 2017-06-09: qty 1

## 2017-06-09 NOTE — ED Triage Notes (Signed)
Patient with left scapula pain, no radiation of the pain.  Increases with palpation or movement.  Patient was able to sling pocketbook over left shoulder.

## 2017-06-09 NOTE — Discharge Instructions (Signed)
X-ray of your shoulder was reassuring.  Take ibuprofen 800 mg every 6 hours as needed for pain.  You can also apply heat to the upper back to help with your symptoms.  Please take muscle relaxer as needed.  This medicine can make you drowsy so please do not drive or work while taking it.  Follow-up with your regular doctor in a week if your pain is not improving.  Return to the emergency department if you develop chest pain, have trouble breathing, have swelling, have radiation of pain to the left arm or jaw or have any new or concerning symptoms.

## 2017-06-09 NOTE — ED Provider Notes (Signed)
[facility] EMERGENCY DEPARTMENT Provider Note   CSN: [id number] Arrival date & time: 06/09/17  0630     History   Chief Complaint Chief Complaint  Patient presents with  . Shoulder Pain    HPI [name] is a 42 y.o. female.  HPI   [name] is a 42yo female with a history of hypertension and hyperlipidemia who presents to the emergency department for evaluation of left thoracic back pain.  Patient reports that she was in her closet this morning and she took a big snap to clear out her nose.  She immediately felt a pulling sensation in her left upper back and has had persistent pain ever since.  She reports her pain is 10/10 in severity and feels aching in nature.  Pain is worsened with palpation over the back or any kind of arm or neck movement.  She reports that her 49 year old daughter had to help her put on her jacket because it was so painful when she was moving her arm.  She has not taken any over-the-counter medication for her symptoms.  She denies radiation of pain to the left arm.  Denies associated chest pain, shortness of breath, nausea/vomiting, diaphoresis.  Denies fevers, chills, numbness, weakness, rash, wounds or arthralgias elsewhere.  Past Medical History:  Diagnosis Date  . Essential hypertension   . Fibroids 2012  . Heart palpitations    Noted to have PVCs and no arrhythmia on cardiac monitor -not taking beta blocker;  ECHO December 2013: EF 55-60% with mild LA ablation. Otherwise normal.  . Syphilis   . Trichomonal infection     Patient Active Problem List   Diagnosis Date Noted  . Varicose veins of bilateral lower extremities with pain 12/25/2016  . Aortic heart murmur on examination 06/29/2015  . Obesity (BMI 30-39.9) 12/29/2013  . Hyperlipidemia with target LDL less than 100 12/29/2013  . Essential hypertension   . Heart palpitations   . Unspecified disorder of menstruation and other abnormal bleeding from female genital tract  05/07/2013  . Dysmenorrhea 05/07/2013  . BV (bacterial vaginosis) 05/07/2013  . Candidiasis of vulva and vagina 05/07/2013  . Leiomyoma of uterus, unspecified 04/25/2013    Past Surgical History:  Procedure Laterality Date  . CHOLECYSTECTOMY    . EYE SURGERY    . GALLBLADDER SURGERY      OB History    Gravida Para Term Preterm AB Living   2 2 2     2    SAB TAB Ectopic Multiple Live Births           2       Home Medications    Prior to Admission medications   Medication Sig Start Date End Date Taking? Authorizing Provider  cyclobenzaprine (FLEXERIL) 10 MG tablet Take 1 tablet (10 mg total) by mouth every 8 (eight) hours as needed for muscle spasms. 05/04/17   Shelly Bombard, MD  diltiazem (CARDIZEM CD) 180 MG 24 hr capsule Take 1 capsule (180 mg total) by mouth daily. 12/21/16   Leonie Man, MD  Fe Cbn-Fe Gluc-FA-B12-C-DSS [facility] 90) 90-1 MG TABS Take 1 tablet by mouth daily before breakfast. 05/05/17   Shelly Bombard, MD  fluocinonide cream (LIDEX) 0.73 % Apply 1 application topically 2 (two) times daily. Patient not taking: Reported on 05/04/2017 11/04/15   Shelly Bombard, MD  hydrochlorothiazide (HYDRODIURIL) 25 MG tablet Take 1 tablet (25 mg total) by mouth daily. 12/21/16   Leonie Man, MD  ibuprofen (ADVIL,MOTRIN) 800 MG tablet Take 1 tablet (800 mg total) by mouth every 8 (eight) hours as needed. 05/04/17   Shelly Bombard, MD  loratadine (CLARITIN) 10 MG tablet Take 1 tablet (10 mg total) by mouth daily. Patient not taking: Reported on 05/04/2017 02/21/15   Comer Locket, PA-C  medroxyPROGESTERone (DEPO-PROVERA) 150 MG/ML injection Inject 1 mL (150 mg total) into the muscle every 3 (three) months. 05/18/17   Shelly Bombard, MD  metroNIDAZOLE (METROGEL VAGINAL) 0.75 % vaginal gel Place 1 Applicatorful vaginally 2 (two) times daily. 05/18/17   Shelly Bombard, MD  Prenat w/o A-FeCbn-Meth-FA-DHA (PRENATE MINI) 29-0.6-0.4-350 MG CAPS Take 1 capsule by mouth  daily before breakfast. Patient not taking: Reported on 05/04/2017 09/17/15   Shelly Bombard, MD  Prenatal Vit-Iron Carbonyl-FA (PRENATAL PLUS IRON) 29-1 MG TABS Take 1 tablet by mouth daily before breakfast. 05/04/17   Shelly Bombard, MD  terconazole (TERAZOL 7) 0.4 % vaginal cream Place 1 applicator vaginally at bedtime. Patient not taking: Reported on 05/04/2017 05/05/16   Shelly Bombard, MD  tinidazole [facility]) 500 MG tablet Take 2 tablets (1,000 mg total) by mouth daily with breakfast. Patient not taking: Reported on 05/04/2017 05/05/16   Shelly Bombard, MD    Family History Family History  Problem Relation Age of Onset  . Hypertension Father   . Early death Father   . Diabetes Mother     Social History Social History   Tobacco Use  . Smoking status: Current Every Day Smoker    Packs/day: 0.50    Types: Cigarettes    Start date: 05/29/2003  . Smokeless tobacco: Never Used  . Tobacco comment: smoke about 4 a day  Substance Use Topics  . Alcohol use: Yes    Alcohol/week: 1.2 oz    Types: 2 Standard drinks or equivalent per week    Comment: Occasionaly   . Drug use: No     Allergies   Patient has no known allergies.   Review of Systems Review of Systems  Constitutional: Negative for chills, diaphoresis and fever.  Respiratory: Negative for shortness of breath.   Cardiovascular: Negative for chest pain.  Gastrointestinal: Negative for nausea and vomiting.  Musculoskeletal: Positive for back pain (left thoracic back pain). Negative for neck pain.  Skin: Negative for color change, rash and wound.  Neurological: Negative for weakness and numbness.     Physical Exam Updated Vital Signs BP (!) 141/92 (BP Location: Right Arm)   Pulse 81   Temp 98.3 F (36.8 C) (Oral)   Resp 18   LMP 06/09/2017   SpO2 100%   Physical Exam  Constitutional: She is oriented to person, place, and time. She appears well-developed and well-nourished. No distress.  HENT:  Head:  Normocephalic and atraumatic.  Eyes: Right eye exhibits no discharge. Left eye exhibits no discharge.  Neck: Normal range of motion. Neck supple.  No midline cervical spine tenderness.  Cardiovascular: Normal rate, regular rhythm and intact distal pulses. Exam reveals no friction rub.  No murmur heard. Pulmonary/Chest: Effort normal and breath sounds normal. No stridor. No respiratory distress. She has no wheezes. She has no rales.  Chest wall nontender to palpation.  Musculoskeletal:  Tender to palpation over left paraspinal muscles of the thoracic spine.  No overlying rash or ecchymosis.  No midline T-spine or L-spine tenderness.  Strength 5/5 bilaterally in shoulder flexion/extension, elbow flexion/extension and grip strength.  Radial pulses 2+ bilaterally. Cap refill <2sec  Neurological: She  is alert and oriented to person, place, and time. Coordination normal.  Distal sensation to light touch intact in bilateral upper extremities.  Skin: Skin is warm and dry. Capillary refill takes less than 2 seconds. She is not diaphoretic.  Psychiatric: She has a normal mood and affect. Her behavior is normal.  Nursing note and vitals reviewed.    ED Treatments / Results  Labs (all labs ordered are listed, but only abnormal results are displayed) Labs Reviewed - No data to display  EKG  EKG Interpretation None       Radiology Dg Shoulder Left  Result Date: 06/09/2017 CLINICAL DATA:  Acute onset of left posterior shoulder pain and scapular pain. EXAM: LEFT SHOULDER - 2+ VIEW COMPARISON:  None. FINDINGS: There is no evidence of fracture or dislocation. The left humeral head is seated within the glenoid fossa. The acromioclavicular joint is unremarkable in appearance. No significant soft tissue abnormalities are seen. The visualized portions of the left lung are clear. IMPRESSION: No evidence of fracture or dislocation. Electronically Signed   By: Garald Balding M.D.   On: 06/09/2017 07:04     Procedures Procedures (including critical care time)  Medications Ordered in ED Medications  cyclobenzaprine (FLEXERIL) tablet 10 mg (10 mg Oral Given 06/09/17 1012)     Initial Impression / Assessment and Plan / ED Course  I have reviewed the triage vital signs and the nursing notes.  Pertinent labs & imaging results that were available during my care of the patient were reviewed by me and considered in my medical decision making (see chart for details).    Patient presents with acute onset thoracic back pain after taking a very deep inhale while trying to clear her nose this morning.  Patient reports feeling a pulling sensation and has had subsequent pain with palpation and movement of the arm or neck.  X-ray left shoulder without acute abnormality. Presentation consistent with musculoskeletal strain of the thoracic spine.  Lungs CTA, VSS. No concern for ACS given no chest pain, shortness of breath, radiation to the left arm or jaw, diaphoresis and pain is reproducible to palpation with clear onset being strain of her back. Have counseled patient on use of NSAIDs for pain. Will also prescribe a muscle relaxer medicine.  Discussed with her that this medicine can make her drowsy and she should not drive or work while taking it.  Discussed recheck with her primary doctor in a week if symptoms are not improving.  Discussed return precautions and patient agrees and voiced understanding to the above plan.  Final Clinical Impressions(s) / ED Diagnoses   Final diagnoses:  Acute left-sided thoracic back pain    ED Discharge Orders        Ordered    cyclobenzaprine (FLEXERIL) 10 MG tablet  2 times daily PRN     06/09/17 1013       Glyn Ade, PA-C 06/09/17 1013    Charlesetta Shanks, MD 06/09/17 1705

## 2017-06-15 ENCOUNTER — Telehealth: Payer: Self-pay

## 2017-06-15 NOTE — Telephone Encounter (Signed)
TC to pt regarding message on bleeding w/ depo since 06/03/17 Pt advised abnormal bleeding is expected for the first 3-70months with contraception Pt advised to take IBP every 6-8 hrs to help slow bleeding down  Pt c/o changing heavy tampons about 5 per day.   Per pt request Please advise of any other options patient may need to do to help w/ heavy bleeding.

## 2017-06-16 ENCOUNTER — Other Ambulatory Visit: Payer: Self-pay | Admitting: Obstetrics

## 2017-06-16 DIAGNOSIS — N939 Abnormal uterine and vaginal bleeding, unspecified: Secondary | ICD-10-CM

## 2017-06-16 MED ORDER — NORETHINDRONE ACETATE 5 MG PO TABS: 5.0000 mg | ORAL_TABLET | Freq: Every day | ORAL | 0 refills | Status: DC

## 2017-06-16 NOTE — Telephone Encounter (Signed)
Patient wants to proceed with trying OCP Please send to [facility] on [address].

## 2017-06-16 NOTE — Telephone Encounter (Signed)
May try 1 pack of OCP's if bleeding continues.

## 2017-06-16 NOTE — Telephone Encounter (Signed)
Norethindrone Acetate 5 mg daily for 30 days  Rx.

## 2017-06-29 ENCOUNTER — Telehealth: Payer: Self-pay | Admitting: Cardiology

## 2017-06-29 NOTE — Telephone Encounter (Signed)
New Message  Pt c/o swelling: STAT is pt has developed SOB within 24 hours  1) How much weight have you gained and in what time span? no  2) If swelling, where is the swelling located? Ankles and legs  3) Are you currently taking a fluid pill? yes  4) Are you currently SOB? no  5) Do you have a log of your daily weights (if so, list)? no  6) Have you gained 3 pounds in a day or 5 pounds in a week? no  7) Have you traveled recently? no

## 2017-06-29 NOTE — Telephone Encounter (Signed)
Returned call to patient of Dr. Ellyn Hack who c/o swelling in ankles & legs for 1 week. Has gained a few pounds here and there, denies SOB. She is complaint with hctz and diltiazem. When asked if she monitors her salt and sodium intake, she states she thinks this is the problem, she is not diligent about this.   Advised monitoring sodium intake, compression stockings/socks and where to purchase, elevating legs so they are not in a dependent position. She agreed w/plan.   Routed to MD for any additional recommendations.

## 2017-07-24 ENCOUNTER — Telehealth: Payer: Self-pay

## 2017-07-24 NOTE — Telephone Encounter (Signed)
TC from pt regarding heavy bleeding with contraception still. Pt is currently on depo and you also prescribed BCP's    Pt is soaking a pad and has clots Considering surgery. Please advise.

## 2017-07-25 NOTE — Telephone Encounter (Signed)
Patient go to [facility] for evaluation

## 2017-07-26 ENCOUNTER — Inpatient Hospital Stay (HOSPITAL_COMMUNITY)
Admission: AD | Admit: 2017-07-26 | Discharge: 2017-07-26 | Disposition: A | Discharge status: Home / Self Care | Payer: BLUE CROSS/BLUE SHIELD | Source: Ambulatory Visit | Attending: Obstetrics & Gynecology | Admitting: Obstetrics & Gynecology

## 2017-07-26 ENCOUNTER — Encounter (HOSPITAL_COMMUNITY): Payer: Self-pay | Admitting: *Deleted

## 2017-07-26 DIAGNOSIS — N939 Abnormal uterine and vaginal bleeding, unspecified: Secondary | ICD-10-CM | POA: Insufficient documentation

## 2017-07-26 DIAGNOSIS — D250 Submucous leiomyoma of uterus: Secondary | ICD-10-CM | POA: Diagnosis not present

## 2017-07-26 DIAGNOSIS — Z3202 Encounter for pregnancy test, result negative: Secondary | ICD-10-CM | POA: Diagnosis not present

## 2017-07-26 DIAGNOSIS — F17210 Nicotine dependence, cigarettes, uncomplicated: Secondary | ICD-10-CM | POA: Insufficient documentation

## 2017-07-26 DIAGNOSIS — D252 Subserosal leiomyoma of uterus: Secondary | ICD-10-CM | POA: Diagnosis not present

## 2017-07-26 DIAGNOSIS — D500 Iron deficiency anemia secondary to blood loss (chronic): Secondary | ICD-10-CM | POA: Diagnosis not present

## 2017-07-26 DIAGNOSIS — D251 Intramural leiomyoma of uterus: Secondary | ICD-10-CM | POA: Insufficient documentation

## 2017-07-26 LAB — CBC
HEMATOCRIT: 30 % — AB (ref 36.0–46.0)
HEMOGLOBIN: 9.6 g/dL — AB (ref 12.0–15.0)
MCH: 29.3 pg (ref 26.0–34.0)
MCHC: 32 g/dL (ref 30.0–36.0)
MCV: 91.5 fL (ref 78.0–100.0)
Platelets: 423 10*3/uL — ABNORMAL HIGH (ref 150–400)
RBC: 3.28 MIL/uL — AB (ref 3.87–5.11)
RDW: 17.7 % — ABNORMAL HIGH (ref 11.5–15.5)
WBC: 10.9 10*3/uL — ABNORMAL HIGH (ref 4.0–10.5)

## 2017-07-26 LAB — POCT PREGNANCY, URINE: Preg Test, Ur: NEGATIVE

## 2017-07-26 LAB — URINALYSIS, ROUTINE W REFLEX MICROSCOPIC
Bacteria, UA: NONE SEEN
Bilirubin Urine: NEGATIVE
GLUCOSE, UA: NEGATIVE mg/dL
Ketones, ur: NEGATIVE mg/dL
Leukocytes, UA: NEGATIVE
Nitrite: NEGATIVE
PH: 7 (ref 5.0–8.0)
Protein, ur: NEGATIVE mg/dL
SPECIFIC GRAVITY, URINE: 1.005 (ref 1.005–1.030)

## 2017-07-26 MED ORDER — MEGESTROL ACETATE 40 MG PO TABS: 40.0000 mg | ORAL_TABLET | Freq: Two times a day (BID) | ORAL | 0 refills | Status: DC

## 2017-07-26 NOTE — Discharge Instructions (Signed)
Abnormal Uterine Bleeding  Abnormal uterine bleeding means bleeding more than usual from your uterus. It can include:   Bleeding between periods.   Bleeding after sex.   Bleeding that is heavier than normal.   Periods that last longer than usual.   Bleeding after you have stopped having your period (menopause).    There are many problems that may cause this. You should see a doctor for any kind of bleeding that is not normal. Treatment depends on the cause of the bleeding.  Follow these instructions at home:   Watch your condition for any changes.   Do not use tampons, douche, or have sex, if your doctor tells you not to.   Change your pads often.   Get regular well-woman exams. Make sure they include a pelvic exam and cervical cancer screening.   Keep all follow-up visits as told by your doctor. This is important.  Contact a doctor if:   The bleeding lasts more than one week.   You feel dizzy at times.   You feel like you are going to throw up (nauseous).   You throw up.  Get help right away if:   You pass out.   You have to change pads every hour.   You have belly (abdominal) pain.   You have a fever.   You get sweaty.   You get weak.   You passing large blood clots from your vagina.  Summary   Abnormal uterine bleeding means bleeding more than usual from your uterus.   There are many problems that may cause this. You should see a doctor for any kind of bleeding that is not normal.   Treatment depends on the cause of the bleeding.  This information is not intended to replace advice given to you by your health care provider. Make sure you discuss any questions you have with your health care provider.  Document Released: 01/09/2009 Document Revised: 03/08/2016 Document Reviewed: 03/08/2016  Elsevier Interactive Patient Education  2017 [employer]  Uterine Fibroids  Uterine fibroids are tissue masses (tumors). They are also called leiomyomas. They can develop inside of a woman's womb  (uterus). They can grow very large. Fibroids are not cancerous (benign). Most fibroids do not require medical treatment.  Follow these instructions at home:   Keep all follow-up visits as told by your doctor. This is important.   Take medicines only as told by your doctor.  ? If you were prescribed a hormone treatment, take the hormone medicines exactly as told.  ? Do not take aspirin. It can cause bleeding.   Ask your doctor about taking iron pills and increasing the amount of dark green, leafy vegetables in your diet. These actions can help to boost your blood iron levels.   Pay close attention to your period. Tell your doctor about any changes, such as:  ? Increased blood flow. This may require you to use more pads or tampons than usual per month.  ? A change in the number of days that your period lasts per month.  ? A change in symptoms that come with your period, such as back pain or cramping in your belly area (abdomen).  Contact a doctor if:   You have pain in your back or the area between your hip bones (pelvic area) that is not controlled by medicines.   You have pain in your abdomen that is not controlled with medicines.   You have an increase in bleeding between and during periods.     You soak tampons or pads in a half hour or less.   You feel lightheaded.   You feel extra tired.   You feel weak.  Get help right away if:   You pass out (faint).   You have a sudden increase in pelvic pain.  This information is not intended to replace advice given to you by your health care provider. Make sure you discuss any questions you have with your health care provider.  Document Released: 04/16/2010 Document Revised: 11/13/2015 Document Reviewed: 09/10/2013  Elsevier Interactive Patient Education  2018 [employer]

## 2017-07-26 NOTE — MAU Note (Signed)
Pt reports she has had vaginal bleeding since 03/09. Took Depo in February and is on BCP's for heavy bleeding. Cramping off/on

## 2017-07-26 NOTE — MAU Provider Note (Signed)
History     CSN: [id number]  Arrival date and time: 07/26/17 1002  Chief Complaint  Patient presents with  . Vaginal Bleeding   42 y.o. Non-pregnant female here with VB. She reports bleedin has been ongoing and daily since 06/03/17. She uses multiple pads and soaks through tampons some days and other days bleeding is lighter requiring only 3-4 pads per day. Passing clots on heavy days. Reports feeling tired. No lightheadedness or syncope. She has been evaulated for the bleeding and was found to have multiple fibroids. TVUS in February of 2019: Measurements: 14.4 x 9.0 x 9.4 cm. Bulky anteverted myomatous uterus, increased in size in the interval. Representative fibroids as follows:-right anterior uterine body subserosal 5.9 x 5.1 x 5.9 cm fibroid, increased from 3.4 x 3.4 x 3.0 cm-posterior fundal submucosal 1.8 x 1.5 cm fibroid, increased from 1.3 x 0.9 cm-posterior uterine body intramural 3.2 x 2.5 cm fibroid, increased from 1.7 x 1.3 cm She was given Depo in February which did not help, was given OCPs but this hasn't helped either. She has some associated cramping and uses Ibuprofen but doesn't get much relief. Her primary GYN mentioned a procedure (?ablation) or hysterectomy to resolve the problem and pt is now considering this.     Past Medical History:  Diagnosis Date  . Essential hypertension   . Fibroids 2012  . Heart palpitations    Noted to have PVCs and no arrhythmia on cardiac monitor -not taking beta blocker;  ECHO December 2013: EF 55-60% with mild LA ablation. Otherwise normal.  . Syphilis   . Trichomonal infection     Past Surgical History:  Procedure Laterality Date  . CHOLECYSTECTOMY    . EYE SURGERY    . GALLBLADDER SURGERY      Family History  Problem Relation Age of Onset  . Hypertension Father   . Early death Father   . Diabetes Mother     Social History   Tobacco Use  . Smoking status: Current Every Day Smoker    Packs/day: 0.50    Types:  Cigarettes    Start date: 05/29/2003  . Smokeless tobacco: Never Used  . Tobacco comment: smoke about 4 a day  Substance Use Topics  . Alcohol use: Yes    Alcohol/week: 1.2 oz    Types: 2 Standard drinks or equivalent per week    Comment: Occasionaly   . Drug use: No    Allergies: No Known Allergies  Facility-Administered Medications Prior to Admission  Medication Dose Route Frequency Provider Last Rate Last Dose  . medroxyPROGESTERone (DEPO-PROVERA) injection 150 mg  150 mg Intramuscular Q90 days Shelly Bombard, MD   150 mg at 05/18/17 1648   Medications Prior to Admission  Medication Sig Dispense Refill Last Dose  . cyclobenzaprine (FLEXERIL) 10 MG tablet Take 1 tablet (10 mg total) by mouth 2 (two) times daily as needed for muscle spasms. 20 tablet 0 Past Month at Unknown time  . diltiazem (CARDIZEM CD) 180 MG 24 hr capsule Take 1 capsule (180 mg total) by mouth daily. (Patient taking differently: Take 180 mg by mouth at bedtime. ) 90 capsule 3 07/25/2017 at Unknown time  . Fe Cbn-Fe Gluc-FA-B12-C-DSS (FERRALET 90) 90-1 MG TABS Take 1 tablet by mouth daily before breakfast. 30 each 5 07/25/2017 at Unknown time  . hydrochlorothiazide (HYDRODIURIL) 25 MG tablet Take 1 tablet (25 mg total) by mouth daily. 90 tablet 3 07/26/2017 at Unknown time  . ibuprofen (ADVIL,MOTRIN) 800 MG tablet Take  1 tablet (800 mg total) by mouth every 8 (eight) hours as needed. (Patient taking differently: Take 800 mg by mouth every 8 (eight) hours as needed for moderate pain or cramping. ) 30 tablet 5 Past Month at Unknown time  . norethindrone (AYGESTIN) 5 MG tablet Take 1 tablet (5 mg total) by mouth daily. 30 tablet 0 07/25/2017 at Unknown time  . Prenatal Vit-Iron Carbonyl-FA (PRENATAL PLUS IRON) 29-1 MG TABS Take 1 tablet by mouth daily before breakfast. 30 tablet 11 07/25/2017 at Unknown time  . medroxyPROGESTERone (DEPO-PROVERA) 150 MG/ML injection Inject 1 mL (150 mg total) into the muscle every 3 (three)  months. 1 mL 4 05/18/2017  . metroNIDAZOLE (METROGEL VAGINAL) 0.75 % vaginal gel Place 1 Applicatorful vaginally 2 (two) times daily. (Patient not taking: Reported on 07/26/2017) 70 g 4 Not Taking at Unknown time  . terconazole (TERAZOL 7) 0.4 % vaginal cream Place 1 applicator vaginally at bedtime. (Patient not taking: Reported on 05/04/2017) 45 g 2 Not Taking at Unknown time  . tinidazole (TINDAMAX) 500 MG tablet Take 2 tablets (1,000 mg total) by mouth daily with breakfast. (Patient not taking: Reported on 05/04/2017) 10 tablet 5 Not Taking at Unknown time    Review of Systems  Constitutional: Negative for fever.  Gastrointestinal: Positive for abdominal pain.  Genitourinary: Positive for vaginal bleeding.   Physical Exam   Blood pressure 130/72, pulse 76, temperature 99.4 F (37.4 C), temperature source Oral, resp. rate 16, height 5' (1.524 m), weight 158 lb (71.7 kg), last menstrual period 06/03/2017, SpO2 98 %.  Physical Exam  Nursing note and vitals reviewed. Constitutional: She is oriented to person, place, and time. She appears well-developed and well-nourished. No distress.  HENT:  Head: Normocephalic and atraumatic.  Neck: Normal range of motion.  Cardiovascular: Normal rate.  Respiratory: Effort normal. No respiratory distress.  GI: Soft. She exhibits no distension and no mass. There is no tenderness. There is no rebound and no guarding.  Genitourinary:  Genitourinary Comments: External: no lesions or erythema Vagina: rugated, pink, moist, small drk bloody discharge Uterus: ++ enlarged, anteverted, non tender, no CMT Adnexae: no masses, no tenderness left, no tenderness right Cervix parous, nml   Musculoskeletal: Normal range of motion.  Neurological: She is alert and oriented to person, place, and time.  Skin: Skin is warm and dry.  Psychiatric: She has a normal mood and affect.   Results for orders placed or performed during the hospital encounter of 07/26/17 (from the  past 24 hour(s))  Urinalysis, Routine w reflex microscopic     Status: Abnormal   Collection Time: 07/26/17 10:20 AM  Result Value Ref Range   Color, Urine STRAW (A) YELLOW   APPearance CLEAR CLEAR   Specific Gravity, Urine 1.005 1.005 - 1.030   pH 7.0 5.0 - 8.0   Glucose, UA NEGATIVE NEGATIVE mg/dL   Hgb urine dipstick MODERATE (A) NEGATIVE   Bilirubin Urine NEGATIVE NEGATIVE   Ketones, ur NEGATIVE NEGATIVE mg/dL   Protein, ur NEGATIVE NEGATIVE mg/dL   Nitrite NEGATIVE NEGATIVE   Leukocytes, UA NEGATIVE NEGATIVE   RBC / HPF 0-5 0 - 5 RBC/hpf   WBC, UA 0-5 0 - 5 WBC/hpf   Bacteria, UA NONE SEEN NONE SEEN   Squamous Epithelial / LPF 0-5 0 - 5   Mucus PRESENT   Pregnancy, urine POC     Status: None   Collection Time: 07/26/17 10:30 AM  Result Value Ref Range   Preg Test, Ur NEGATIVE NEGATIVE  CBC     Status: Abnormal   Collection Time: 07/26/17 10:42 AM  Result Value Ref Range   WBC 10.9 (H) 4.0 - 10.5 K/uL   RBC 3.28 (L) 3.87 - 5.11 MIL/uL   Hemoglobin 9.6 (L) 12.0 - 15.0 g/dL   HCT 30.0 (L) 36.0 - 46.0 %   MCV 91.5 78.0 - 100.0 fL   MCH 29.3 26.0 - 34.0 pg   MCHC 32.0 30.0 - 36.0 g/dL   RDW 17.7 (H) 11.5 - 15.5 %   Platelets 423 (H) 150 - 400 K/uL   MAU Course  Procedures  MDM Labs ordered and reviewed. No worsening anemia (last Hgb 8.0). Will treat with Megace and refer back to her GYN for mngt. May need EBX as well for complete eval. Stable for discharge home.  Assessment and Plan   1. Abnormal uterine bleeding (AUB)   2. Intramural, submucous, and subserous leiomyoma of uterus   3. Anemia, blood loss    Discharge home Follow up with Dr. Jodi Mourning in 2 weeks Rx Megace Return precautions  Allergies as of 07/26/2017   No Known Allergies     Medication List    STOP taking these medications   metroNIDAZOLE 0.75 % vaginal gel Commonly known as:  METROGEL VAGINAL   norethindrone 5 MG tablet Commonly known as:  AYGESTIN   terconazole 0.4 % vaginal  cream Commonly known as:  TERAZOL 7   tinidazole 500 MG tablet Commonly known as:  TINDAMAX     TAKE these medications   cyclobenzaprine 10 MG tablet Commonly known as:  FLEXERIL Take 1 tablet (10 mg total) by mouth 2 (two) times daily as needed for muscle spasms.   diltiazem 180 MG 24 hr capsule Commonly known as:  CARDIZEM CD Take 1 capsule (180 mg total) by mouth daily. What changed:  when to take this   FERRALET 90 90-1 MG Tabs Take 1 tablet by mouth daily before breakfast.   hydrochlorothiazide 25 MG tablet Commonly known as:  HYDRODIURIL Take 1 tablet (25 mg total) by mouth daily.   ibuprofen 800 MG tablet Commonly known as:  ADVIL,MOTRIN Take 1 tablet (800 mg total) by mouth every 8 (eight) hours as needed. What changed:  reasons to take this   medroxyPROGESTERone 150 MG/ML injection Commonly known as:  DEPO-PROVERA Inject 1 mL (150 mg total) into the muscle every 3 (three) months.   megestrol 40 MG tablet Commonly known as:  MEGACE Take 1 tablet (40 mg total) by mouth 2 (two) times daily.   PRENATAL PLUS IRON 29-1 MG Tabs Take 1 tablet by mouth daily before breakfast.      Julianne Handler, CNM 07/26/2017, 11:22 AM

## 2017-08-02 ENCOUNTER — Ambulatory Visit: Payer: BLUE CROSS/BLUE SHIELD | Admitting: Obstetrics

## 2017-08-10 ENCOUNTER — Encounter: Payer: Self-pay | Admitting: Obstetrics

## 2017-08-10 ENCOUNTER — Ambulatory Visit (INDEPENDENT_AMBULATORY_CARE_PROVIDER_SITE_OTHER): Payer: BLUE CROSS/BLUE SHIELD | Admitting: Obstetrics

## 2017-08-10 ENCOUNTER — Ambulatory Visit: Payer: BLUE CROSS/BLUE SHIELD

## 2017-08-10 VITALS — BP 149/95 | HR 91 | Wt 159.0 lb

## 2017-08-10 DIAGNOSIS — D500 Iron deficiency anemia secondary to blood loss (chronic): Secondary | ICD-10-CM

## 2017-08-10 DIAGNOSIS — E6609 Other obesity due to excess calories: Secondary | ICD-10-CM

## 2017-08-10 DIAGNOSIS — Z8619 Personal history of other infectious and parasitic diseases: Secondary | ICD-10-CM | POA: Diagnosis not present

## 2017-08-10 DIAGNOSIS — N939 Abnormal uterine and vaginal bleeding, unspecified: Secondary | ICD-10-CM | POA: Diagnosis not present

## 2017-08-10 DIAGNOSIS — D251 Intramural leiomyoma of uterus: Secondary | ICD-10-CM

## 2017-08-10 DIAGNOSIS — I10 Essential (primary) hypertension: Secondary | ICD-10-CM | POA: Diagnosis not present

## 2017-08-10 DIAGNOSIS — Z6831 Body mass index (BMI) 31.0-31.9, adult: Secondary | ICD-10-CM

## 2017-08-10 DIAGNOSIS — Z3042 Encounter for surveillance of injectable contraceptive: Secondary | ICD-10-CM

## 2017-08-10 DIAGNOSIS — N946 Dysmenorrhea, unspecified: Secondary | ICD-10-CM | POA: Diagnosis not present

## 2017-08-10 MED ORDER — MEDROXYPROGESTERONE ACETATE 150 MG/ML IM SUSP
150.0000 mg | Freq: Once | INTRAMUSCULAR | Status: AC
  Administered 2017-08-10: 150 mg via INTRAMUSCULAR

## 2017-08-10 MED ORDER — OXYCODONE-ACETAMINOPHEN 10-325 MG PO TABS: 1.0000 | ORAL_TABLET | Freq: Four times a day (QID) | ORAL | 0 refills | Status: DC | PRN

## 2017-08-10 MED ORDER — MEGESTROL ACETATE 40 MG PO TABS: 40.0000 mg | ORAL_TABLET | Freq: Two times a day (BID) | ORAL | 0 refills | Status: DC

## 2017-08-10 NOTE — Progress Notes (Signed)
Presents for FU and Depo. Given in right deltoid, tolerated well.  Next DEPO 8/1-15/19.  Administrations This Visit    medroxyPROGESTERone (DEPO-PROVERA) injection 150 mg    Admin Date 08/10/2017 Action Given Dose 150 mg Route Intramuscular Administered By Tamela Oddi, RMA

## 2017-08-10 NOTE — Progress Notes (Signed)
Patient ID: [name], female   DOB: 1975/04/24, 42 y.o.   MRN: [id number]  Chief Complaint  Patient presents with  . Follow-up  . Contraception    DEPO    HPI [name] is a 42 y.o. female.  History of uterine fibroids.  Heavy and painful periods resulting in severe anemia ( Hgb 7grams ). The bleeding and pain has been unresponsive to medical management.  Patient desires definitive surgical management. HPI  Past Medical History:  Diagnosis Date  . Essential hypertension   . Fibroids 2012  . Heart palpitations    Noted to have PVCs and no arrhythmia on cardiac monitor -not taking beta blocker;  ECHO December 2013: EF 55-60% with mild LA ablation. Otherwise normal.  . Syphilis   . Trichomonal infection     Past Surgical History:  Procedure Laterality Date  . CHOLECYSTECTOMY    . EYE SURGERY    . GALLBLADDER SURGERY      Family History  Problem Relation Age of Onset  . Hypertension Father   . Early death Father   . Diabetes Mother     Social History Social History   Tobacco Use  . Smoking status: Current Every Day Smoker    Packs/day: 0.50    Types: Cigarettes    Start date: 05/29/2003  . Smokeless tobacco: Never Used  . Tobacco comment: smoke about 4 a day  Substance Use Topics  . Alcohol use: Yes    Alcohol/week: 1.2 oz    Types: 2 Standard drinks or equivalent per week    Comment: Occasionaly   . Drug use: No    No Known Allergies  Current Outpatient Medications  Medication Sig Dispense Refill  . cyclobenzaprine (FLEXERIL) 10 MG tablet Take 1 tablet (10 mg total) by mouth 2 (two) times daily as needed for muscle spasms. 20 tablet 0  . diltiazem (CARDIZEM CD) 180 MG 24 hr capsule Take 1 capsule (180 mg total) by mouth daily. (Patient taking differently: Take 180 mg by mouth at bedtime. ) 90 capsule 3  . Fe Cbn-Fe Gluc-FA-B12-C-DSS (FERRALET 90) 90-1 MG TABS Take 1 tablet by mouth daily before breakfast. 30 each 5  . hydrochlorothiazide  (HYDRODIURIL) 25 MG tablet Take 1 tablet (25 mg total) by mouth daily. 90 tablet 3  . ibuprofen (ADVIL,MOTRIN) 800 MG tablet Take 1 tablet (800 mg total) by mouth every 8 (eight) hours as needed. (Patient taking differently: Take 800 mg by mouth every 8 (eight) hours as needed for moderate pain or cramping. ) 30 tablet 5  . medroxyPROGESTERone (DEPO-PROVERA) 150 MG/ML injection Inject 1 mL (150 mg total) into the muscle every 3 (three) months. 1 mL 4  . megestrol (MEGACE) 40 MG tablet Take 1 tablet (40 mg total) by mouth 2 (two) times daily. 40 tablet 0  . oxyCODONE-acetaminophen (PERCOCET) 10-325 MG tablet Take 1 tablet by mouth every 6 (six) hours as needed for pain. 30 tablet 0  . Prenatal Vit-Iron Carbonyl-FA (PRENATAL PLUS IRON) 29-1 MG TABS Take 1 tablet by mouth daily before breakfast. 30 tablet 11   No current facility-administered medications for this visit.     Review of Systems Review of Systems Constitutional: negative for fatigue and weight loss Respiratory: negative for cough and wheezing Cardiovascular: negative for chest pain, fatigue and palpitations Gastrointestinal: negative for abdominal pain and change in bowel habits Genitourinary:POSITIVE for heavy, painful periods  Integument/breast: negative for nipple discharge Musculoskeletal:negative for myalgias Neurological: negative for gait problems and tremors  Behavioral/Psych: negative for abusive relationship, depression Endocrine: negative for temperature intolerance      Blood pressure (!) 149/95, pulse 91, weight 159 lb (72.1 kg).  Physical Exam Physical Exam:  Deferred  >50% of 15 min visit spent on counseling and coordination of care.   Data Reviewed Ultrasound CBC  Assessment     1. Fibroids, intramural  2. Severe dysmenorrhea Rx: - oxyCODONE-acetaminophen (PERCOCET) 10-325 MG tablet; Take 1 tablet by mouth every 6 (six) hours as needed for pain.  Dispense: 30 tablet; Refill: 0  3. Abnormal uterine  bleeding (AUB) Rx: - megestrol (MEGACE) 40 MG tablet; Take 1 tablet (40 mg total) by mouth 2 (two) times daily.  Dispense: 40 tablet; Refill: 0  4. Iron deficiency anemia due to chronic blood loss - taking iron and PNV's  5. H/O syphilis.  ? Recent outbreak that was painful, but has healed.  Need to R/O Herpes. Rx: - RPR  6. Class 1 obesity due to excess calories without serious comorbidity with body mass index (BMI) of 31.0 to 31.9 in adult - program of caloric reduction, exercise and behavioral modification recommended.   7. HTN (hypertension), benign - stable, managed by PCP    Plan    Follow up in 2 weeks for surgical consult  Orders Placed This Encounter  Procedures  . RPR   Meds ordered this encounter  Medications  . medroxyPROGESTERone (DEPO-PROVERA) injection 150 mg  . oxyCODONE-acetaminophen (PERCOCET) 10-325 MG tablet    Sig: Take 1 tablet by mouth every 6 (six) hours as needed for pain.    Dispense:  30 tablet    Refill:  0  . megestrol (MEGACE) 40 MG tablet    Sig: Take 1 tablet (40 mg total) by mouth 2 (two) times daily.    Dispense:  40 tablet    Refill:  0    Shelly Bombard MD 08-10-2017

## 2017-08-11 ENCOUNTER — Encounter: Payer: Self-pay | Admitting: Obstetrics

## 2017-08-12 ENCOUNTER — Other Ambulatory Visit: Payer: Self-pay | Admitting: Obstetrics

## 2017-08-12 LAB — RPR, QUANT+TP ABS (REFLEX): TREPONEMA PALLIDUM AB: POSITIVE — AB

## 2017-08-12 LAB — RPR: RPR: REACTIVE — AB

## 2017-08-15 ENCOUNTER — Other Ambulatory Visit: Payer: Self-pay | Admitting: Obstetrics

## 2017-08-23 ENCOUNTER — Encounter: Payer: Self-pay | Admitting: Obstetrics and Gynecology

## 2017-08-23 ENCOUNTER — Ambulatory Visit (INDEPENDENT_AMBULATORY_CARE_PROVIDER_SITE_OTHER): Payer: BLUE CROSS/BLUE SHIELD | Admitting: Obstetrics and Gynecology

## 2017-08-23 VITALS — BP 151/94 | HR 84 | Resp 16 | Wt 157.6 lb

## 2017-08-23 DIAGNOSIS — N939 Abnormal uterine and vaginal bleeding, unspecified: Secondary | ICD-10-CM

## 2017-08-23 DIAGNOSIS — D259 Leiomyoma of uterus, unspecified: Secondary | ICD-10-CM

## 2017-08-24 ENCOUNTER — Encounter: Payer: Self-pay | Admitting: Obstetrics and Gynecology

## 2017-08-24 NOTE — Progress Notes (Signed)
GYNECOLOGY OFFICE FOLLOW UP NOTE  History:  42 y.o. [id number] here today for follow up for AUB and fibroid uterus. Has tried hormonal medication in past with no improvement. Has heavy periods resulting in anemia with Hgb to 7. She desires definitive surgical management.     Past Medical History:  Diagnosis Date  . Essential hypertension   . Fibroids 2012  . Heart palpitations    Noted to have PVCs and no arrhythmia on cardiac monitor -not taking beta blocker;  ECHO December 2013: EF 55-60% with mild LA ablation. Otherwise normal.  . Syphilis   . Trichomonal infection     Past Surgical History:  Procedure Laterality Date  . CHOLECYSTECTOMY    . EYE SURGERY    . GALLBLADDER SURGERY       Current Outpatient Medications:  .  cyclobenzaprine (FLEXERIL) 10 MG tablet, Take 1 tablet (10 mg total) by mouth 2 (two) times daily as needed for muscle spasms., Disp: 20 tablet, Rfl: 0 .  diltiazem (CARDIZEM CD) 180 MG 24 hr capsule, Take 1 capsule (180 mg total) by mouth daily. (Patient taking differently: Take 180 mg by mouth at bedtime. ), Disp: 90 capsule, Rfl: 3 .  Fe Cbn-Fe Gluc-FA-B12-C-DSS (FERRALET 90) 90-1 MG TABS, Take 1 tablet by mouth daily before breakfast., Disp: 30 each, Rfl: 5 .  hydrochlorothiazide (HYDRODIURIL) 25 MG tablet, Take 1 tablet (25 mg total) by mouth daily., Disp: 90 tablet, Rfl: 3 .  ibuprofen (ADVIL,MOTRIN) 800 MG tablet, Take 1 tablet (800 mg total) by mouth every 8 (eight) hours as needed. (Patient taking differently: Take 800 mg by mouth every 8 (eight) hours as needed for moderate pain or cramping. ), Disp: 30 tablet, Rfl: 5 .  medroxyPROGESTERone (DEPO-PROVERA) 150 MG/ML injection, Inject 1 mL (150 mg total) into the muscle every 3 (three) months., Disp: 1 mL, Rfl: 4 .  megestrol (MEGACE) 40 MG tablet, Take 1 tablet (40 mg total) by mouth 2 (two) times daily., Disp: 40 tablet, Rfl: 0 .  oxyCODONE-acetaminophen (PERCOCET) 10-325 MG tablet, Take 1 tablet by mouth  every 6 (six) hours as needed for pain., Disp: 30 tablet, Rfl: 0 .  Prenatal Vit-Iron Carbonyl-FA (PRENATAL PLUS IRON) 29-1 MG TABS, Take 1 tablet by mouth daily before breakfast., Disp: 30 tablet, Rfl: 11  The following portions of the patient's history were reviewed and updated as appropriate: allergies, current medications, past family history, past medical history, past social history, past surgical history and problem list.   Review of Systems:  Pertinent items noted in HPI and remainder of comprehensive ROS otherwise negative.   Objective:  Physical Exam BP (!) 151/94 (BP Location: Left Arm, Cuff Size: Large)   Pulse 84   Resp 16   Wt 157 lb 9.6 oz (71.5 kg)   LMP 06/03/2017 (Exact Date)   BMI 30.78 kg/m  CONSTITUTIONAL: Well-developed, well-nourished female in no acute distress.  HENT:  Normocephalic, atraumatic. External right and left ear normal. Oropharynx is clear and moist EYES: Conjunctivae and EOM are normal. Pupils are equal, round, and reactive to light. No scleral icterus.  NECK: Normal range of motion, supple, no masses SKIN: Skin is warm and dry. No rash noted. Not diaphoretic. No erythema. No pallor. NEUROLOGIC: Alert and oriented to person, place, and time. Normal reflexes, muscle tone coordination. No cranial nerve deficit noted. PSYCHIATRIC: Normal mood and affect. Normal behavior. Normal judgment and thought content. CARDIOVASCULAR: Normal heart rate noted RESPIRATORY: Effort and breath sounds normal, no problems with  respiration noted ABDOMEN: Soft, no distention noted. Large mobile uterus with fibroids palpated at the level of the umbilicus  PELVIC: Deferred MUSCULOSKELETAL: Normal range of motion. No edema noted.  Labs and Imaging CLINICAL DATA:  42 year old female with history of uterine fibroids presents with pelvic pain. Uncertain LMP.  EXAM: TRANSABDOMINAL AND TRANSVAGINAL ULTRASOUND OF PELVIS  TECHNIQUE: Both transabdominal and transvaginal  ultrasound examinations of the pelvis were performed. Transabdominal technique was performed for global imaging of the pelvis including uterus, ovaries, adnexal regions, and pelvic cul-de-sac. It was necessary to proceed with endovaginal exam following the transabdominal exam to visualize the endometrium, myometrium and adnexa.  COMPARISON:  11/08/2012 pelvic sonogram.  FINDINGS: Uterus  Measurements: 14.4 x 9.0 x 9.4 cm. Bulky anteverted myomatous uterus, increased in size in the interval. Representative fibroids as follows:  -right anterior uterine body subserosal 5.9 x 5.1 x 5.9 cm fibroid, increased from 3.4 x 3.4 x 3.0 cm  -posterior fundal submucosal 1.8 x 1.5 cm fibroid, increased from 1.3 x 0.9 cm  -posterior uterine body intramural 3.2 x 2.5 cm fibroid, increased from 1.7 x 1.3 cm  Endometrium  Thickness: 8 mm.  No focal abnormality visualized.  Right ovary  Measurements: 1.9 x 1.0 x 1.6 cm (seen only on the transabdominal images). Normal appearance/no adnexal mass.  Left ovary  Measurements: 3.6 x 2.2 x 3.1 cm. Normal appearance/no adnexal mass.  Other findings  No abnormal free fluid.  IMPRESSION: 1. Bulky myomatous anteverted uterus. Overall uterine size and fibroid sizes have increased in the interval as detailed. 2. No evidence of endometrial, ovarian or adnexal abnormality.   Electronically Signed   By: Ilona Sorrel M.D.   On: 05/12/2017 14:32  Assessment & Plan:  1. Uterine leiomyoma, unspecified location 2. Abnormal uterine bleeding Patient with long standing history of AUB and large fibroids who failed medical therapy who desires definitive management. I reviewed hysterectomy with her and she understands that she will not be able to have any children after the procedure. I reviewed abdominal vs vaginal vs laparoscopic hysterectomy with her. She has a mesh above the umbilicus for a hernia repair after delivery of her children. I  reviewed that given the mesh in place, and the size of the uterus to the level of the umbilicus, I would not advise a laparoscopic surgery. She strongly desires minimally invasive surgery. I reviewed that as she has not had extensive abdominal surgery, a vaginal hysterectomy would be reasonable to attempt but given the size of the fibroids and the uterus, may not be possible. She desires attempt made for vaginal hysterectomy.  The risks of hysterectomy were discussed with the patient; including but not limited to: infection which may require antibiotics; bleeding which may require transfusion or re-operation; injury to bowel, bladder, ureters or other surrounding organs; need for additional procedures, incisional problems, thromboembolic phenomenon and other postoperative/anesthesia complications. Reviewed that with a hysterectomy, she will not be able to have children in the future. Reviewed recommendation to remove fallopian tubes to reduce risk of ovarian cancer, but as she is 46, would not recommend removal of ovaries at this time. Reviewed that ovaries may be removed if they are abnormal appearing, anatomy requires it or if there is damage to blood supply. Reviewed that removal of ovaries would put her into surgical menopause and we will attempt to avoid this if possible. Reviewed risks/benefits of keeping cervix and need for continued pap smears if she desires to keep her cervix. Reviewed that with vaginal hysterectomy,  she will have to have cervix removed. Reviewed expected post surgery course and hospital stay.  Patient verbalized understanding of the above and consents to blood transfusion in the event of a life-threatening hemorrhage. Answered all questions.   She has a medical history of HTN, she will f/u with PCP for improved control. Will need clearance by anesthesia.   Routine preventative health maintenance measures emphasized. Please refer to After Visit Summary for other counseling  recommendations.   No follow-ups on file.    Feliz Beam, M.D. Attending [place], [facility] for [employer], [place]

## 2017-08-28 ENCOUNTER — Telehealth: Payer: Self-pay | Admitting: Obstetrics and Gynecology

## 2017-08-28 NOTE — Telephone Encounter (Signed)
Called patient regarding TAH vs vaginal hysterectomy, she desire minimally invasive surgery but also potentially wants to keep her cervix, will have her come into office for further discussion. She is agreeable to this plan.   Feliz Beam, M.D. Attending [place], [facility] for [employer], [place]

## 2017-08-30 ENCOUNTER — Telehealth: Payer: Self-pay | Admitting: *Deleted

## 2017-08-30 NOTE — Telephone Encounter (Signed)
Pt called to office stating she is having severe pain and passing clots. Pt has discussed surgical options with Dr Rosana Hoes.  Return call to pt.  Pt was advised to be seen at [facility] if she is having severe pain and/or saturating more than 1 pad/hour.  Pt states that she is currently taking Megace with no relief of bleeding.  Pt is only taking Ibuprofen with little relief, not taking Percocet as previously given. Pt states she does not want to go to [facility]- all they will do is give her meds and send her home.  Pt was advised to try Percocet as given along with continue Ibuprofen, comfort measures given.  Pt again instructed to be seen if she is bleeding heavy or bleeding increases.  Pt states understanding.   Pt was schedule a consult appt next week with Dr Rosana Hoes to discuss and plan surgical procedure.  Pt advise contact office if she has any more concerns.   Pt states understanding.

## 2017-09-06 ENCOUNTER — Encounter: Payer: Self-pay | Admitting: Obstetrics and Gynecology

## 2017-09-06 ENCOUNTER — Ambulatory Visit: Payer: BLUE CROSS/BLUE SHIELD | Admitting: Obstetrics and Gynecology

## 2017-09-06 ENCOUNTER — Other Ambulatory Visit (HOSPITAL_COMMUNITY)
Admission: RE | Admit: 2017-09-06 | Discharge: 2017-09-06 | Disposition: A | Discharge status: Home / Self Care | Payer: BLUE CROSS/BLUE SHIELD | Source: Ambulatory Visit | Attending: Obstetrics and Gynecology | Admitting: Obstetrics and Gynecology

## 2017-09-06 VITALS — BP 151/94 | HR 97 | Ht 60.0 in | Wt 159.6 lb

## 2017-09-06 DIAGNOSIS — Z3202 Encounter for pregnancy test, result negative: Secondary | ICD-10-CM

## 2017-09-06 DIAGNOSIS — N939 Abnormal uterine and vaginal bleeding, unspecified: Secondary | ICD-10-CM | POA: Insufficient documentation

## 2017-09-06 DIAGNOSIS — D259 Leiomyoma of uterus, unspecified: Secondary | ICD-10-CM | POA: Diagnosis not present

## 2017-09-06 LAB — POCT URINE PREGNANCY: Preg Test, Ur: NEGATIVE

## 2017-09-06 NOTE — Progress Notes (Signed)
GYNECOLOGY OFFICE FOLLOW UP NOTE  History:  42 y.o. [id number] here today for follow up for questions about hysterectomy, previously was undecided on vaginal vs abdominal and had questions about what a partial hysterectomy vs total hysterectomy means.   She is having worsening pelvic pain, reports she has days when she cannot go to work, has to lay down and not move. Has been bleeding since May 31, 2017, some days heavier than others. She is ready for definitive management.  Past Medical History:  Diagnosis Date  . Essential hypertension   . Fibroids 2012  . Heart palpitations    Noted to have PVCs and no arrhythmia on cardiac monitor -not taking beta blocker;  ECHO December 2013: EF 55-60% with mild LA ablation. Otherwise normal.  . Syphilis   . Trichomonal infection     Past Surgical History:  Procedure Laterality Date  . CHOLECYSTECTOMY    . EYE SURGERY    . GALLBLADDER SURGERY       Current Outpatient Medications:  .  cyclobenzaprine (FLEXERIL) 10 MG tablet, Take 1 tablet (10 mg total) by mouth 2 (two) times daily as needed for muscle spasms., Disp: 20 tablet, Rfl: 0 .  diltiazem (CARDIZEM CD) 180 MG 24 hr capsule, Take 1 capsule (180 mg total) by mouth daily. (Patient taking differently: Take 180 mg by mouth at bedtime. ), Disp: 90 capsule, Rfl: 3 .  Fe Cbn-Fe Gluc-FA-B12-C-DSS (FERRALET 90) 90-1 MG TABS, Take 1 tablet by mouth daily before breakfast., Disp: 30 each, Rfl: 5 .  hydrochlorothiazide (HYDRODIURIL) 25 MG tablet, Take 1 tablet (25 mg total) by mouth daily., Disp: 90 tablet, Rfl: 3 .  ibuprofen (ADVIL,MOTRIN) 800 MG tablet, Take 1 tablet (800 mg total) by mouth every 8 (eight) hours as needed. (Patient taking differently: Take 800 mg by mouth every 8 (eight) hours as needed for moderate pain or cramping. ), Disp: 30 tablet, Rfl: 5 .  medroxyPROGESTERone (DEPO-PROVERA) 150 MG/ML injection, Inject 1 mL (150 mg total) into the muscle every 3 (three) months., Disp: 1 mL,  Rfl: 4 .  megestrol (MEGACE) 40 MG tablet, Take 1 tablet (40 mg total) by mouth 2 (two) times daily., Disp: 40 tablet, Rfl: 0 .  Prenatal Vit-Iron Carbonyl-FA (PRENATAL PLUS IRON) 29-1 MG TABS, Take 1 tablet by mouth daily before breakfast., Disp: 30 tablet, Rfl: 11 .  oxyCODONE-acetaminophen (PERCOCET) 10-325 MG tablet, Take 1 tablet by mouth every 6 (six) hours as needed for pain. (Patient not taking: Reported on 09/06/2017), Disp: 30 tablet, Rfl: 0  The following portions of the patient's history were reviewed and updated as appropriate: allergies, current medications, past family history, past medical history, past social history, past surgical history and problem list.   Review of Systems:  Pertinent items noted in HPI and remainder of comprehensive ROS otherwise negative.   Objective:  Physical Exam BP (!) 151/94   Pulse 97   Ht 5' (1.524 m)   Wt 159 lb 9.6 oz (72.4 kg)   BMI 31.17 kg/m  CONSTITUTIONAL: Well-developed, well-nourished female in no acute distress.  HENT:  Normocephalic, atraumatic. External right and left ear normal. Oropharynx is clear and moist EYES: Conjunctivae and EOM are normal. Pupils are equal, round, and reactive to light. No scleral icterus.  NECK: Normal range of motion, supple, no masses SKIN: Skin is warm and dry. No rash noted. Not diaphoretic. No erythema. No pallor. NEUROLOGIC: Alert and oriented to person, place, and time. Normal reflexes, muscle tone coordination. No cranial  nerve deficit noted. PSYCHIATRIC: Normal mood and affect. Normal behavior. Normal judgment and thought content. CARDIOVASCULAR: Normal heart rate noted RESPIRATORY: Effort  normal, no problems with respiration noted ABDOMEN: Soft, no distention noted.  Bulky uterus palpable in abdomen to umbilicus PELVIC: normal appearing external female genitalia, normal appearing cervix tilted posteriorly, large, mobile fibroid uterus palpable, no adnexal tenderness or masses MUSCULOSKELETAL:  Normal range of motion. No edema noted.  Labs and Imaging No results found.  Assessment & Plan:   1. Abnormal uterine bleeding EMB recommended today for heavy bleeding, patient agreeable, please see procedure note - POCT urine pregnancy - Surgical pathology  2. Uterine leiomyoma, unspecified location Again reviewed options for hysterectomy, vaginal vs abdominal as she is not a good candidate for laparoscopic given size of uterus and presence of mesh in upper abdomen from a hernia repair. Reviewed recommendation to remove uterus, cervix, tubes but leave ovaries, reviewed risks/benefits. She verbalizes understanding and desires to proceed with vaginal hysterectomy.   The risks of vaginal hysterectomy were discussed with the patient; including but not limited to: infection which may require antibiotics; bleeding which may require transfusion or re-operation; injury to bowel, bladder, ureters or other surrounding organs; need for additional procedures, incisional problems, conversion to laparotomy, thromboembolic phenomenon and other postoperative/anesthesia complications. Reviewed that with a hysterectomy, she will not be able to have children in the future. Reviewed recommendation to remove fallopian tubes to reduce risk of ovarian cancer, but as she is 89, would not recommend removal of ovaries at this time. Reviewed that ovaries may be removed if they are abnormal appearing, anatomy requires it or if there is damage to blood supply. Reviewed that removal of ovaries would put her into surgical menopause and we will attempt to avoid this if possible. Reviewed expected post surgery course and hospital stay.  Patient verbalized understanding of the above. Answered all questions.   Will plan for vaginal hysterectomy Will f/u results of EMB norco sent for pain management prior to surgery  Routine preventative health maintenance measures emphasized. Please refer to After Visit Summary for other  counseling recommendations.   Return in about 3 months (around 12/07/2017).    Feliz Beam, M.D. Attending [place], [facility] for [employer], [place]

## 2017-09-06 NOTE — Progress Notes (Signed)
ENDOMETRIAL BIOPSY      [name] is a 42 y.o. [id number] here for endometrial biopsy.  The indications for endometrial biopsy were reviewed.  Risks of the biopsy including cramping, bleeding, infection, uterine perforation, inadequate specimen and need for additional procedures were discussed. The patient states she understands and agrees to undergo procedure today. Consent was signed. Time out was performed.   Indications: AUB, heavy menses Urine HCG: 4  A bivalve speculum was placed into the vagina and the cervix was easily visualized and was prepped with Betadine x2. A single-toothed tenaculum was placed on the anterior lip of the cervix to stabilize it. The 3 mm pipelle was introduced into the endometrial cavity without difficulty to a depth of 9 cm, and a moderate amount of clot was obtained and sent to pathology, unclear how much was endometrial tissue. This was repeated for a total of 4 passes. The instruments were removed from the patient's vagina. Minimal bleeding from the cervix at the tenaculum was noted.   The patient tolerated the procedure well. Routine post-procedure instructions were given to the patient.     Feliz Beam, M.D. Attending [place], [facility] for [employer], [place]

## 2017-09-06 NOTE — Progress Notes (Signed)
Pt has additional questions regarding upcoming vaginal hyst.

## 2017-09-07 ENCOUNTER — Encounter: Payer: Self-pay | Admitting: *Deleted

## 2017-09-07 MED ORDER — HYDROCODONE-ACETAMINOPHEN 5-325 MG PO TABS: 1.0000 | ORAL_TABLET | Freq: Four times a day (QID) | ORAL | 0 refills | Status: DC | PRN

## 2017-09-12 ENCOUNTER — Encounter (HOSPITAL_COMMUNITY): Payer: Self-pay

## 2017-09-14 ENCOUNTER — Telehealth: Payer: Self-pay | Admitting: Obstetrics and Gynecology

## 2017-09-14 NOTE — Telephone Encounter (Signed)
Called patient to discuss results and review recent visit. No answer, left message.  Feliz Beam, M.D. Attending [place], [facility] for [employer], [place]

## 2017-09-15 NOTE — Patient Instructions (Addendum)
Your procedure is scheduled on:  Tuesday, July 2  Enter through the [employer] [facility] at: 8 am  Pick up the phone at the desk and dial [phone].  Call this number if you have problems the morning of surgery: [phone].  Remember: Do NOT eat food or Do NOT drink clear liquids (including water) after midnight Monday.  Take these medicines the morning of surgery with a SIP OF WATER: Tylenol if needed for pain.   Do Not smoke on the day of surgery.  Stop herbal medications, vitamin supplements, ibuprofen/NSAIDS 1 week prior to surgery.  Do NOT wear jewelry (body piercing), metal hair clips/bobby pins, make-up, or nail polish. Do NOT wear lotions, powders, or perfumes.  You may wear deoderant. Do NOT shave for 48 hours prior to surgery. Do NOT bring valuables to the hospital.  Leave suitcase in car.  After surgery it may be brought to your room.  For patients admitted to the hospital, checkout time is 11:00 AM the day of discharge. Home with Jannette Fogo cell [phone].

## 2017-09-18 ENCOUNTER — Telehealth: Payer: Self-pay | Admitting: Obstetrics and Gynecology

## 2017-09-18 ENCOUNTER — Encounter (HOSPITAL_COMMUNITY)
Admission: RE | Admit: 2017-09-18 | Discharge: 2017-09-18 | Disposition: A | Discharge status: Home / Self Care | Payer: BLUE CROSS/BLUE SHIELD | Source: Ambulatory Visit | Attending: Obstetrics and Gynecology | Admitting: Obstetrics and Gynecology

## 2017-09-18 ENCOUNTER — Other Ambulatory Visit: Payer: Self-pay

## 2017-09-18 ENCOUNTER — Encounter: Payer: Self-pay | Admitting: Obstetrics and Gynecology

## 2017-09-18 ENCOUNTER — Encounter (HOSPITAL_COMMUNITY): Payer: Self-pay

## 2017-09-18 DIAGNOSIS — Z01812 Encounter for preprocedural laboratory examination: Secondary | ICD-10-CM | POA: Diagnosis not present

## 2017-09-18 HISTORY — DX: Anemia, unspecified: D64.9

## 2017-09-18 HISTORY — DX: Gastro-esophageal reflux disease without esophagitis: K21.9

## 2017-09-18 LAB — CBC
HEMATOCRIT: 34.8 % — AB (ref 36.0–46.0)
HEMOGLOBIN: 11.3 g/dL — AB (ref 12.0–15.0)
MCH: 30.7 pg (ref 26.0–34.0)
MCHC: 32.5 g/dL (ref 30.0–36.0)
MCV: 94.6 fL (ref 78.0–100.0)
Platelets: 324 10*3/uL (ref 150–400)
RBC: 3.68 MIL/uL — AB (ref 3.87–5.11)
RDW: 16.2 % — ABNORMAL HIGH (ref 11.5–15.5)
WBC: 7.9 10*3/uL (ref 4.0–10.5)

## 2017-09-18 LAB — BASIC METABOLIC PANEL
ANION GAP: 10 (ref 5–15)
BUN: 11 mg/dL (ref 6–20)
CHLORIDE: 104 mmol/L (ref 101–111)
CO2: 23 mmol/L (ref 22–32)
Calcium: 9.6 mg/dL (ref 8.9–10.3)
Creatinine, Ser: 0.71 mg/dL (ref 0.44–1.00)
GFR calc non Af Amer: >60 mL/min (ref >60)
GLUCOSE: 116 mg/dL — AB (ref 65–99)
POTASSIUM: 3.5 mmol/L (ref 3.5–5.1)
Sodium: 137 mmol/L (ref 135–145)

## 2017-09-18 LAB — ABO/RH: ABO/RH(D): A POS

## 2017-09-18 NOTE — Progress Notes (Signed)
Reviewed h/o syphilis, titers with Dr. Tommy Medal of ID regarding upcoming hysterectomy. He believes with her fourfold decrease in titers from 1:16 to 1:2 (found in H&P from delivery admission 08/24/2004), she was adequately treated. With recent titer of 1:4, no need for further treatment or any additional management regarding hysterectomy.    Feliz Beam, M.D. Attending [place], [facility] for [employer], [place]

## 2017-09-18 NOTE — Telephone Encounter (Signed)
Reviewed h/o syphilis with patient, she has had no symptoms since diagnosis and treatment many years ago.   Feliz Beam, M.D. Attending [place], [facility] for [employer], [place]

## 2017-09-25 DIAGNOSIS — Z3482 Encounter for supervision of other normal pregnancy, second trimester: Secondary | ICD-10-CM

## 2017-09-26 ENCOUNTER — Encounter (HOSPITAL_COMMUNITY): Payer: Self-pay | Admitting: Emergency Medicine

## 2017-09-26 ENCOUNTER — Ambulatory Visit (HOSPITAL_COMMUNITY): Payer: BLUE CROSS/BLUE SHIELD | Admitting: Anesthesiology

## 2017-09-26 ENCOUNTER — Inpatient Hospital Stay (HOSPITAL_COMMUNITY)
Admission: RE | Admit: 2017-09-26 | Discharge: 2017-09-27 | DRG: 742 | Disposition: A | Discharge status: Home / Self Care | Payer: BLUE CROSS/BLUE SHIELD | Source: Ambulatory Visit | Attending: Obstetrics and Gynecology | Admitting: Obstetrics and Gynecology

## 2017-09-26 ENCOUNTER — Encounter (HOSPITAL_COMMUNITY)
Admission: RE | Disposition: A | Discharge status: Home / Self Care | Payer: Self-pay | Source: Ambulatory Visit | Attending: Obstetrics and Gynecology

## 2017-09-26 DIAGNOSIS — D259 Leiomyoma of uterus, unspecified: Secondary | ICD-10-CM | POA: Diagnosis not present

## 2017-09-26 DIAGNOSIS — D62 Acute posthemorrhagic anemia: Secondary | ICD-10-CM | POA: Diagnosis not present

## 2017-09-26 DIAGNOSIS — N939 Abnormal uterine and vaginal bleeding, unspecified: Secondary | ICD-10-CM | POA: Diagnosis present

## 2017-09-26 DIAGNOSIS — N920 Excessive and frequent menstruation with regular cycle: Secondary | ICD-10-CM

## 2017-09-26 DIAGNOSIS — N946 Dysmenorrhea, unspecified: Secondary | ICD-10-CM | POA: Diagnosis present

## 2017-09-26 DIAGNOSIS — E669 Obesity, unspecified: Secondary | ICD-10-CM | POA: Diagnosis present

## 2017-09-26 DIAGNOSIS — I10 Essential (primary) hypertension: Secondary | ICD-10-CM | POA: Diagnosis present

## 2017-09-26 DIAGNOSIS — F17210 Nicotine dependence, cigarettes, uncomplicated: Secondary | ICD-10-CM | POA: Diagnosis present

## 2017-09-26 DIAGNOSIS — Z6831 Body mass index (BMI) 31.0-31.9, adult: Secondary | ICD-10-CM | POA: Diagnosis not present

## 2017-09-26 HISTORY — PX: VAGINAL HYSTERECTOMY: SHX2639

## 2017-09-26 HISTORY — PX: HYSTERECTOMY ABDOMINAL WITH SALPINGECTOMY: SHX6725

## 2017-09-26 LAB — CREATININE, SERUM
CREATININE: 0.63 mg/dL (ref 0.44–1.00)
GFR calc Af Amer: >60 mL/min (ref >60)

## 2017-09-26 LAB — PREPARE RBC (CROSSMATCH)

## 2017-09-26 LAB — CBC
HCT: 18.8 % — ABNORMAL LOW (ref 36.0–46.0)
Hemoglobin: 6.1 g/dL — CL (ref 12.0–15.0)
MCH: 31.3 pg (ref 26.0–34.0)
MCHC: 32.4 g/dL (ref 30.0–36.0)
MCV: 96.4 fL (ref 78.0–100.0)
PLATELETS: 263 10*3/uL (ref 150–400)
RBC: 1.95 MIL/uL — ABNORMAL LOW (ref 3.87–5.11)
RDW: 16.7 % — ABNORMAL HIGH (ref 11.5–15.5)
WBC: 12.8 10*3/uL — AB (ref 4.0–10.5)

## 2017-09-26 LAB — PREGNANCY, URINE: Preg Test, Ur: NEGATIVE

## 2017-09-26 SURGERY — HYSTERECTOMY, VAGINAL
Anesthesia: General | Site: Vagina | Laterality: Bilateral

## 2017-09-26 MED ORDER — SODIUM CHLORIDE 0.9 % IJ SOLN
INTRAMUSCULAR | Status: AC
  Filled 2017-09-26: qty 100

## 2017-09-26 MED ORDER — VASOPRESSIN 20 UNIT/ML IV SOLN
INTRAVENOUS | Status: AC
  Filled 2017-09-26: qty 1

## 2017-09-26 MED ORDER — SUGAMMADEX SODIUM 200 MG/2ML IV SOLN
INTRAVENOUS | Status: DC | PRN
  Administered 2017-09-26: 200 mg via INTRAVENOUS

## 2017-09-26 MED ORDER — ROCURONIUM BROMIDE 100 MG/10ML IV SOLN
INTRAVENOUS | Status: AC
  Filled 2017-09-26: qty 1

## 2017-09-26 MED ORDER — LACTATED RINGERS IV SOLN
INTRAVENOUS | Status: DC
  Administered 2017-09-26 (×4): via INTRAVENOUS

## 2017-09-26 MED ORDER — LIDOCAINE HCL (CARDIAC) PF 100 MG/5ML IV SOSY
PREFILLED_SYRINGE | INTRAVENOUS | Status: AC
  Filled 2017-09-26: qty 5

## 2017-09-26 MED ORDER — HYDROCHLOROTHIAZIDE 25 MG PO TABS
25.0000 mg | ORAL_TABLET | Freq: Every day | ORAL | Status: DC
  Administered 2017-09-27: 25 mg via ORAL
  Filled 2017-09-26: qty 1

## 2017-09-26 MED ORDER — MAGNESIUM HYDROXIDE 400 MG/5ML PO SUSP: 30.0000 mL | Freq: Every day | ORAL | Status: DC | PRN

## 2017-09-26 MED ORDER — PROMETHAZINE HCL 25 MG/ML IJ SOLN
6.2500 mg | INTRAMUSCULAR | Status: DC | PRN
Start: 2017-09-26 — End: 2017-09-26

## 2017-09-26 MED ORDER — FENTANYL CITRATE (PF) 100 MCG/2ML IJ SOLN
INTRAMUSCULAR | Status: AC
  Filled 2017-09-26: qty 2

## 2017-09-26 MED ORDER — DEXAMETHASONE SODIUM PHOSPHATE 4 MG/ML IJ SOLN
INTRAMUSCULAR | Status: AC
  Filled 2017-09-26: qty 1

## 2017-09-26 MED ORDER — ONDANSETRON HCL 4 MG/2ML IJ SOLN
INTRAMUSCULAR | Status: AC
  Filled 2017-09-26: qty 2

## 2017-09-26 MED ORDER — DEXAMETHASONE SODIUM PHOSPHATE 10 MG/ML IJ SOLN
INTRAMUSCULAR | Status: DC | PRN
  Administered 2017-09-26: 4 mg via INTRAVENOUS

## 2017-09-26 MED ORDER — MIDAZOLAM HCL 2 MG/2ML IJ SOLN
INTRAMUSCULAR | Status: AC
  Filled 2017-09-26: qty 2

## 2017-09-26 MED ORDER — DILTIAZEM HCL ER COATED BEADS 180 MG PO CP24
180.0000 mg | ORAL_CAPSULE | Freq: Every day | ORAL | Status: DC
  Administered 2017-09-26: 180 mg via ORAL
  Filled 2017-09-26 (×2): qty 1

## 2017-09-26 MED ORDER — OXYCODONE HCL 5 MG/5ML PO SOLN: 5.0000 mg | Freq: Once | ORAL | Status: DC | PRN

## 2017-09-26 MED ORDER — OXYCODONE-ACETAMINOPHEN 5-325 MG PO TABS
2.0000 | ORAL_TABLET | ORAL | Status: DC | PRN
  Administered 2017-09-27: 2 via ORAL
  Filled 2017-09-26: qty 2

## 2017-09-26 MED ORDER — SODIUM CHLORIDE 0.9% IV SOLUTION
Freq: Once | INTRAVENOUS | Status: AC
  Administered 2017-09-26: 16:00:00 via INTRAVENOUS

## 2017-09-26 MED ORDER — DOCUSATE SODIUM 100 MG PO CAPS
100.0000 mg | ORAL_CAPSULE | Freq: Two times a day (BID) | ORAL | Status: DC
  Administered 2017-09-26 – 2017-09-27 (×2): 100 mg via ORAL
  Filled 2017-09-26 (×2): qty 1

## 2017-09-26 MED ORDER — PHENYLEPHRINE 40 MCG/ML (10ML) SYRINGE FOR IV PUSH (FOR BLOOD PRESSURE SUPPORT)
PREFILLED_SYRINGE | INTRAVENOUS | Status: AC
  Filled 2017-09-26: qty 10

## 2017-09-26 MED ORDER — SCOPOLAMINE 1 MG/3DAYS TD PT72
MEDICATED_PATCH | TRANSDERMAL | Status: AC
  Administered 2017-09-26: 1.5 mg via TRANSDERMAL
  Filled 2017-09-26: qty 1

## 2017-09-26 MED ORDER — FENTANYL CITRATE (PF) 250 MCG/5ML IJ SOLN
INTRAMUSCULAR | Status: AC
  Filled 2017-09-26: qty 5

## 2017-09-26 MED ORDER — ENOXAPARIN SODIUM 40 MG/0.4ML ~~LOC~~ SOLN
40.0000 mg | SUBCUTANEOUS | Status: DC
  Administered 2017-09-27: 40 mg via SUBCUTANEOUS
  Filled 2017-09-26: qty 0.4

## 2017-09-26 MED ORDER — SOD CITRATE-CITRIC ACID 500-334 MG/5ML PO SOLN
ORAL | Status: AC
  Administered 2017-09-26: 30 mL via ORAL
  Filled 2017-09-26: qty 15

## 2017-09-26 MED ORDER — HYDROMORPHONE HCL 1 MG/ML IJ SOLN
INTRAMUSCULAR | Status: AC
  Administered 2017-09-26: 0.5 mg via INTRAVENOUS
  Filled 2017-09-26: qty 0.5

## 2017-09-26 MED ORDER — CYCLOBENZAPRINE HCL 10 MG PO TABS
10.0000 mg | ORAL_TABLET | Freq: Two times a day (BID) | ORAL | Status: DC | PRN
  Filled 2017-09-26: qty 1

## 2017-09-26 MED ORDER — CEFAZOLIN SODIUM-DEXTROSE 2-4 GM/100ML-% IV SOLN
INTRAVENOUS | Status: AC
  Filled 2017-09-26: qty 100

## 2017-09-26 MED ORDER — HYDROMORPHONE HCL 1 MG/ML IJ SOLN
0.2000 mg | INTRAMUSCULAR | Status: DC | PRN
  Administered 2017-09-26 – 2017-09-27 (×2): 0.6 mg via INTRAVENOUS
  Filled 2017-09-26 (×2): qty 1

## 2017-09-26 MED ORDER — LIDOCAINE HCL (CARDIAC) PF 100 MG/5ML IV SOSY
PREFILLED_SYRINGE | INTRAVENOUS | Status: DC | PRN
  Administered 2017-09-26: 100 mg via INTRAVENOUS

## 2017-09-26 MED ORDER — KETOROLAC TROMETHAMINE 30 MG/ML IJ SOLN: 30.0000 mg | Freq: Four times a day (QID) | INTRAMUSCULAR | Status: AC

## 2017-09-26 MED ORDER — IBUPROFEN 800 MG PO TABS: 800.0000 mg | ORAL_TABLET | Freq: Three times a day (TID) | ORAL | Status: DC | PRN

## 2017-09-26 MED ORDER — PHENYLEPHRINE 40 MCG/ML (10ML) SYRINGE FOR IV PUSH (FOR BLOOD PRESSURE SUPPORT)
PREFILLED_SYRINGE | INTRAVENOUS | Status: DC | PRN
  Administered 2017-09-26 (×3): 40 ug via INTRAVENOUS

## 2017-09-26 MED ORDER — ALBUMIN HUMAN 5 % IV SOLN
INTRAVENOUS | Status: DC | PRN
  Administered 2017-09-26: 12:00:00 via INTRAVENOUS

## 2017-09-26 MED ORDER — KETOROLAC TROMETHAMINE 30 MG/ML IJ SOLN
INTRAMUSCULAR | Status: AC
  Filled 2017-09-26: qty 1

## 2017-09-26 MED ORDER — LACTATED RINGERS IV SOLN
INTRAVENOUS | Status: DC
  Administered 2017-09-26 – 2017-09-27 (×2): via INTRAVENOUS

## 2017-09-26 MED ORDER — FENTANYL CITRATE (PF) 100 MCG/2ML IJ SOLN
25.0000 ug | INTRAMUSCULAR | Status: DC | PRN
  Administered 2017-09-26 (×3): 50 ug via INTRAVENOUS

## 2017-09-26 MED ORDER — OXYCODONE HCL 5 MG PO TABS: 5.0000 mg | ORAL_TABLET | Freq: Once | ORAL | Status: DC | PRN

## 2017-09-26 MED ORDER — HYDROMORPHONE HCL 1 MG/ML IJ SOLN
0.2500 mg | INTRAMUSCULAR | Status: DC | PRN
  Administered 2017-09-26 (×3): 0.5 mg via INTRAVENOUS

## 2017-09-26 MED ORDER — ESMOLOL HCL 100 MG/10ML IV SOLN
INTRAVENOUS | Status: DC | PRN
  Administered 2017-09-26 (×2): 10 mg via INTRAVENOUS

## 2017-09-26 MED ORDER — SIMETHICONE 80 MG PO CHEW: 80.0000 mg | CHEWABLE_TABLET | Freq: Four times a day (QID) | ORAL | Status: DC | PRN

## 2017-09-26 MED ORDER — CEFAZOLIN SODIUM-DEXTROSE 2-4 GM/100ML-% IV SOLN
2.0000 g | INTRAVENOUS | Status: AC
  Administered 2017-09-26: 2 g via INTRAVENOUS

## 2017-09-26 MED ORDER — FENTANYL CITRATE (PF) 100 MCG/2ML IJ SOLN
INTRAMUSCULAR | Status: DC | PRN
  Administered 2017-09-26: 100 ug via INTRAVENOUS
  Administered 2017-09-26 (×3): 50 ug via INTRAVENOUS
  Administered 2017-09-26: 100 ug via INTRAVENOUS
  Administered 2017-09-26 (×2): 50 ug via INTRAVENOUS

## 2017-09-26 MED ORDER — ESMOLOL HCL 100 MG/10ML IV SOLN
INTRAVENOUS | Status: AC
  Filled 2017-09-26: qty 10

## 2017-09-26 MED ORDER — HYDROMORPHONE HCL 1 MG/ML IJ SOLN
INTRAMUSCULAR | Status: AC
  Filled 2017-09-26: qty 0.5

## 2017-09-26 MED ORDER — PROPOFOL 10 MG/ML IV BOLUS
INTRAVENOUS | Status: DC | PRN
  Administered 2017-09-26: 200 mg via INTRAVENOUS

## 2017-09-26 MED ORDER — MIDAZOLAM HCL 2 MG/2ML IJ SOLN
INTRAMUSCULAR | Status: DC | PRN
  Administered 2017-09-26: 2 mg via INTRAVENOUS

## 2017-09-26 MED ORDER — ONDANSETRON HCL 4 MG/2ML IJ SOLN
4.0000 mg | Freq: Four times a day (QID) | INTRAMUSCULAR | Status: DC | PRN
  Administered 2017-09-26: 4 mg via INTRAVENOUS
  Filled 2017-09-26: qty 2

## 2017-09-26 MED ORDER — ONDANSETRON HCL 4 MG/2ML IJ SOLN
INTRAMUSCULAR | Status: DC | PRN
  Administered 2017-09-26: 4 mg via INTRAVENOUS

## 2017-09-26 MED ORDER — PROPOFOL 10 MG/ML IV BOLUS
INTRAVENOUS | Status: AC
  Filled 2017-09-26: qty 20

## 2017-09-26 MED ORDER — ONDANSETRON HCL 4 MG PO TABS: 4.0000 mg | ORAL_TABLET | Freq: Four times a day (QID) | ORAL | Status: DC | PRN

## 2017-09-26 MED ORDER — KETOROLAC TROMETHAMINE 30 MG/ML IJ SOLN
30.0000 mg | Freq: Four times a day (QID) | INTRAMUSCULAR | Status: AC
  Administered 2017-09-26 – 2017-09-27 (×2): 30 mg via INTRAVENOUS
  Filled 2017-09-26 (×2): qty 1

## 2017-09-26 MED ORDER — ROCURONIUM BROMIDE 100 MG/10ML IV SOLN
INTRAVENOUS | Status: DC | PRN
  Administered 2017-09-26 (×2): 10 mg via INTRAVENOUS
  Administered 2017-09-26: 50 mg via INTRAVENOUS

## 2017-09-26 MED ORDER — HYDROCODONE-ACETAMINOPHEN 5-325 MG PO TABS
1.0000 | ORAL_TABLET | ORAL | Status: DC | PRN
  Administered 2017-09-27: 2 via ORAL
  Filled 2017-09-26: qty 2

## 2017-09-26 MED ORDER — SOD CITRATE-CITRIC ACID 500-334 MG/5ML PO SOLN
30.0000 mL | ORAL | Status: AC
  Administered 2017-09-26: 30 mL via ORAL

## 2017-09-26 MED ORDER — VASOPRESSIN 20 UNIT/ML IV SOLN
INTRAVENOUS | Status: DC | PRN
  Administered 2017-09-26: 3 mL via INTRAMUSCULAR

## 2017-09-26 MED ORDER — SCOPOLAMINE 1 MG/3DAYS TD PT72
1.0000 | MEDICATED_PATCH | Freq: Once | TRANSDERMAL | Status: DC
  Administered 2017-09-26: 1.5 mg via TRANSDERMAL

## 2017-09-26 SURGICAL SUPPLY — 33 items
CANISTER SUCT 3000ML PPV (MISCELLANEOUS) ×3 IMPLANT
CONT PATH 16OZ SNAP LID 3702 (MISCELLANEOUS) IMPLANT
DECANTER SPIKE VIAL GLASS SM (MISCELLANEOUS) IMPLANT
GAUZE PACKING 2X5 YD STRL (GAUZE/BANDAGES/DRESSINGS) IMPLANT
GAUZE SPONGE 4X4 16PLY XRAY LF (GAUZE/BANDAGES/DRESSINGS) ×1 IMPLANT
GLOVE BIOGEL PI IND STRL 6.5 (GLOVE) ×2 IMPLANT
GLOVE BIOGEL PI IND STRL 7.0 (GLOVE) ×2 IMPLANT
GLOVE BIOGEL PI INDICATOR 6.5 (GLOVE) ×1
GLOVE BIOGEL PI INDICATOR 7.0 (GLOVE) ×1
GLOVE ECLIPSE 7.0 STRL STRAW (GLOVE) ×6 IMPLANT
GLOVE ORTHOPEDIC STR SZ6.5 (GLOVE) ×3 IMPLANT
GOWN STRL REUS W/TWL LRG LVL3 (GOWN DISPOSABLE) ×12 IMPLANT
HEMOSTAT ARISTA ABSORB 3G PWDR (MISCELLANEOUS) ×1 IMPLANT
NDL SPNL 18GX3.5 QUINCKE PK (NEEDLE) ×2 IMPLANT
NEEDLE HYPO 22GX1.5 SAFETY (NEEDLE) IMPLANT
NEEDLE SPNL 18GX3.5 QUINCKE PK (NEEDLE) ×3 IMPLANT
NS IRRIG 1000ML POUR BTL (IV SOLUTION) ×3 IMPLANT
PACK VAGINAL WOMENS (CUSTOM PROCEDURE TRAY) ×3 IMPLANT
PAD OB MATERNITY 4.3X12.25 (PERSONAL CARE ITEMS) ×3 IMPLANT
PENCIL BUTTON HOLSTER BLD 10FT (ELECTRODE) ×1 IMPLANT
SUT MON AB 4-0 PS1 27 (SUTURE) ×1 IMPLANT
SUT PLAIN 2 0 (SUTURE) ×3
SUT PLAIN ABS 2-0 CT1 27XMFL (SUTURE) IMPLANT
SUT VIC AB 0 CT1 18XCR BRD8 (SUTURE) ×6 IMPLANT
SUT VIC AB 0 CT1 27 (SUTURE) ×18
SUT VIC AB 0 CT1 27XBRD ANBCTR (SUTURE) ×4 IMPLANT
SUT VIC AB 0 CT1 8-18 (SUTURE) ×15
SUT VICRYL 0 TIES 12 18 (SUTURE) ×4 IMPLANT
SYR 20CC LL (SYRINGE) ×3 IMPLANT
TOWEL OR 17X24 6PK STRL BLUE (TOWEL DISPOSABLE) ×6 IMPLANT
TRAY FOLEY W/BAG SLVR 14FR (SET/KITS/TRAYS/PACK) ×3 IMPLANT
TUBING CONNECTING 10 (TUBING) ×1 IMPLANT
YANKAUER SUCT BULB TIP NO VENT (SUCTIONS) ×1 IMPLANT

## 2017-09-26 NOTE — Transfer of Care (Signed)
Immediate Anesthesia Transfer of Care Note  Patient: [name]  Procedure(s) Performed: ATTEMPETED HYSTERECTOMY VAGINAL WITH BILATERAL SALPINGECTOMY (Bilateral Vagina ) HYSTERECTOMY ABDOMINAL WITH BILATERAL SALPINGECTOMY (Bilateral Abdomen)  Patient Location: PACU  Anesthesia Type:General  Level of Consciousness: awake, alert  and oriented  Airway & Oxygen Therapy: Patient Spontanous Breathing and Patient connected to nasal cannula oxygen  Post-op Assessment: Report given to RN, Post -op Vital signs reviewed and stable and Patient moving all extremities  Post vital signs: Reviewed and stable  Last Vitals:  Vitals Value Taken Time  BP 138/80 09/26/2017 12:30 PM  Temp    Pulse 105 09/26/2017 12:31 PM  Resp 20 09/26/2017 12:31 PM  SpO2 100 % 09/26/2017 12:31 PM  Vitals shown include unvalidated device data.  Last Pain:  Vitals:   09/26/17 0829  TempSrc: Oral  PainSc: 8       Patients Stated Pain Goal: 3 (67/28/97 9150)  Complications: No apparent anesthesia complications

## 2017-09-26 NOTE — Anesthesia Procedure Notes (Signed)
Procedure Name: Intubation Date/Time: 09/26/2017 9:27 AM Performed by: Hewitt Blade, CRNA Pre-anesthesia Checklist: Patient identified, Emergency Drugs available, Suction available and Patient being monitored Patient Re-evaluated:Patient Re-evaluated prior to induction Oxygen Delivery Method: Circle system utilized Preoxygenation: Pre-oxygenation with 100% oxygen Induction Type: IV induction Ventilation: Mask ventilation without difficulty Laryngoscope Size: Mac and 3 Grade View: Grade I Tube type: Oral Tube size: 7.0 mm Number of attempts: 1 Airway Equipment and Method: Stylet Placement Confirmation: ETT inserted through vocal cords under direct vision,  positive ETCO2 and breath sounds checked- equal and bilateral Secured at: 22 cm Tube secured with: Tape Dental Injury: Teeth and Oropharynx as per pre-operative assessment

## 2017-09-26 NOTE — H&P (Signed)
OB/GYN History and Physical  [name] is a 42 y.o. [id number] presenting for definitive management for heavy menstrual bleeding, large uterine fibroids.       Past Medical History:  Diagnosis Date  . Anemia   . Essential hypertension   . Fibroids 2012  . GERD (gastroesophageal reflux disease)    diet controlled, no meds  . Heart palpitations    Noted to have PVCs and no arrhythmia on cardiac monitor -not taking beta blocker;  ECHO December 2013: EF 55-60% with mild LA ablation. Otherwise normal., no current problems  . SVD (spontaneous vaginal delivery)    x 2  . Syphilis   . Trichomonal infection     Past Surgical History:  Procedure Laterality Date  . CHOLECYSTECTOMY    . EYE SURGERY Left    retinal detachment  . GALLBLADDER SURGERY      OB History  Gravida Para Term Preterm AB Living  2 2 2     2   SAB TAB Ectopic Multiple Live Births          2    # Outcome Date GA Lbr Len/2nd Weight Sex Delivery Anes PTL Lv  2 Term 08/24/04 [redacted]w[redacted]d  7 lb 12 oz (3.515 kg) F Vag-Spont EPI  LIV  1 Term 03/30/96 [redacted]w[redacted]d  6 lb 15 oz (3.147 kg) M Vag-Spont None  LIV    Social History   Socioeconomic History  . Marital status: Single    Spouse name: Not on file  . Number of children: Not on file  . Years of education: Not on file  . Highest education level: Not on file  Occupational History  . Not on file  Social Needs  . Financial resource strain: Not on file  . Food insecurity:    Worry: Not on file    Inability: Not on file  . Transportation needs:    Medical: Not on file    Non-medical: Not on file  Tobacco Use  . Smoking status: Current Every Day Smoker    Packs/day: 0.50    Years: 10.00    Pack years: 5.00    Types: Cigarettes    Start date: 05/29/2003  . Smokeless tobacco: Never Used  . Tobacco comment: smoke about 4 a day  Substance and Sexual Activity  . Alcohol use: Yes    Alcohol/week: 1.2 oz    Types: 2 Standard drinks or equivalent per week   Comment: Occasionaly   . Drug use: No  . Sexual activity: Yes    Partners: Male    Birth control/protection: Injection, None    Comment: Depo 04/2017  Lifestyle  . Physical activity:    Days per week: Not on file    Minutes per session: Not on file  . Stress: Not on file  Relationships  . Social connections:    Talks on phone: Not on file    Gets together: Not on file    Attends religious service: Not on file    Active member of club or organization: Not on file    Attends meetings of clubs or organizations: Not on file    Relationship status: Not on file  Other Topics Concern  . Not on file  Social History Narrative   Single. Still smokes maybe a quarter of a pack a day. Does not take alcohol or caffeine to    Family History  Problem Relation Age of Onset  . Hypertension Father   . Early death Father   .  Diabetes Mother     Medications Prior to Admission  Medication Sig Dispense Refill Last Dose  . cyclobenzaprine (FLEXERIL) 10 MG tablet Take 1 tablet (10 mg total) by mouth 2 (two) times daily as needed for muscle spasms. 20 tablet 0 Past Week at Unknown time  . diltiazem (CARDIZEM CD) 180 MG 24 hr capsule Take 1 capsule (180 mg total) by mouth daily. (Patient taking differently: Take 180 mg by mouth at bedtime. ) 90 capsule 3 09/25/2017 at Unknown time  . Fe Cbn-Fe Gluc-FA-B12-C-DSS (FERRALET 90) 90-1 MG TABS Take 1 tablet by mouth daily before breakfast. 30 each 5 Past Week at Unknown time  . hydrochlorothiazide (HYDRODIURIL) 25 MG tablet Take 1 tablet (25 mg total) by mouth daily. 90 tablet 3 Past Week at Unknown time  . HYDROcodone-acetaminophen (NORCO/VICODIN) 5-325 MG tablet Take 1 tablet by mouth every 6 (six) hours as needed. 20 tablet 0 Past Week at Unknown time  . ibuprofen (ADVIL,MOTRIN) 800 MG tablet Take 1 tablet (800 mg total) by mouth every 8 (eight) hours as needed. (Patient taking differently: Take 800 mg by mouth every 8 (eight) hours as needed for moderate pain  or cramping. ) 30 tablet 5 Past Month at Unknown time  . megestrol (MEGACE) 40 MG tablet Take 1 tablet (40 mg total) by mouth 2 (two) times daily. 40 tablet 0 Past Week at Unknown time  . oxyCODONE-acetaminophen (PERCOCET) 10-325 MG tablet Take 1 tablet by mouth every 6 (six) hours as needed for pain. 30 tablet 0 Past Month at Unknown time  . Prenatal Vit-Iron Carbonyl-FA (PRENATAL PLUS IRON) 29-1 MG TABS Take 1 tablet by mouth daily before breakfast. 30 tablet 11 Past Week at Unknown time  . medroxyPROGESTERone (DEPO-PROVERA) 150 MG/ML injection Inject 1 mL (150 mg total) into the muscle every 3 (three) months. 1 mL 4 Taking    No Known Allergies  Review of Systems: Negative except for what is mentioned in HPI.     Physical Exam: BP 133/80   Pulse (!) 109   Temp 98.4 F (36.9 C) (Oral)   Resp 16   Ht 5' (1.524 m)   Wt 159 lb (72.1 kg)   LMP 06/03/2017   SpO2 100%   BMI 31.05 kg/m  CONSTITUTIONAL: Well-developed, well-nourished female in no acute distress.  HENT:  Normocephalic, atraumatic, External right and left ear normal. Oropharynx is clear and moist EYES: Conjunctivae and EOM are normal. Pupils are equal, round, and reactive to light. No scleral icterus.  NECK: Normal range of motion, supple, no masses SKIN: Skin is warm and dry. No rash noted. Not diaphoretic. No erythema. No pallor. [place]: Alert and oriented to person, place, and time. Normal reflexes, muscle tone coordination. No cranial nerve deficit noted. PSYCHIATRIC: Normal mood and affect. Normal behavior. Normal judgment and thought content. CARDIOVASCULAR: Normal heart rate noted, regular rhythm RESPIRATORY: Effort and breath sounds normal, no problems with respiration noted ABDOMEN: Soft, nontender, nondistended, gravid.  PELVIC: Deferred MUSCULOSKELETAL: Normal range of motion. No edema and no tenderness. 2+ distal pulses.   Pertinent Labs/Studies:   Results for orders placed or performed during the  hospital encounter of 09/26/17 (from the past 72 hour(s))  Pregnancy, urine     Status: None   Collection Time: 09/26/17  8:00 AM  Result Value Ref Range   Preg Test, Ur NEGATIVE NEGATIVE    Comment:        THE SENSITIVITY OF THIS METHODOLOGY IS >20 mIU/mL. Performed at [facility], [phone]  [address] [place], [place] [address]      CLINICAL DATA:  42 year old female with history of uterine fibroids presents with pelvic pain. Uncertain LMP.  EXAM: TRANSABDOMINAL AND TRANSVAGINAL ULTRASOUND OF PELVIS  TECHNIQUE: Both transabdominal and transvaginal ultrasound examinations of the pelvis were performed. Transabdominal technique was performed for global imaging of the pelvis including uterus, ovaries, adnexal regions, and pelvic cul-de-sac. It was necessary to proceed with endovaginal exam following the transabdominal exam to visualize the endometrium, myometrium and adnexa.  COMPARISON:  11/08/2012 pelvic sonogram.  FINDINGS: Uterus  Measurements: 14.4 x 9.0 x 9.4 cm. Bulky anteverted myomatous uterus, increased in size in the interval. Representative fibroids as follows:  -right anterior uterine body subserosal 5.9 x 5.1 x 5.9 cm fibroid, increased from 3.4 x 3.4 x 3.0 cm  -posterior fundal submucosal 1.8 x 1.5 cm fibroid, increased from 1.3 x 0.9 cm  -posterior uterine body intramural 3.2 x 2.5 cm fibroid, increased from 1.7 x 1.3 cm  Endometrium  Thickness: 8 mm.  No focal abnormality visualized.  Right ovary  Measurements: 1.9 x 1.0 x 1.6 cm (seen only on the transabdominal images). Normal appearance/no adnexal mass.  Left ovary  Measurements: 3.6 x 2.2 x 3.1 cm. Normal appearance/no adnexal mass.  Other findings  No abnormal free fluid.  IMPRESSION: 1. Bulky myomatous anteverted uterus. Overall uterine size and fibroid sizes have increased in the interval as detailed. 2. No evidence of endometrial, ovarian or adnexal  abnormality.   Electronically Signed   By: Ilona Sorrel M.D.   On: 05/12/2017 14:32     Assessment and Plan :KANDYCE DIEGUEZ is a 43 y.o. [id number] admitted for vaginal hysterectomy for large fibroid uterus and heavy menstrual bleeding. The risks of vaginal hysterectomy were discussed with the patient; including but not limited to: infection which may require antibiotics; bleeding which may require transfusion or re-operation; injury to bowel, bladder, ureters or other surrounding organs; need for additional procedures, incisional problems, thromboembolic phenomenon and other postoperative/anesthesia complications. Reviewed that with a hysterectomy, she will not be able to have children in the future. Reviewed recommendation to remove fallopian tubes to reduce risk of ovarian cancer, but as she is 62, would not recommend removal of ovaries at this time. Reviewed that ovaries may be removed if they are abnormal appearing, anatomy requires it or if there is damage to blood supply. Reviewed that removal of ovaries would put her into surgical menopause and we will attempt to avoid this if possible. Reviewed expected post surgery course and hospital stay.  Patient verbalized understanding of the above and consents to blood transfusion in the event of a life-threatening hemorrhage. Answered all questions.   For vaginal hysterectomy, bilateral salpingectomy Patient is NPO Anesthesia aware Preoperative prophylactic antibiotics ordered SCDs  Admission labs To OR when ready    K. Arvilla Meres, M.D. Attending [place], [facility] for [employer], [place]

## 2017-09-26 NOTE — Progress Notes (Signed)
Notified by staff that patient had H/H 6.1/18.8 in PACU, ordered 2 units PRBCs to transfuse.

## 2017-09-26 NOTE — Op Note (Signed)
[name] PROCEDURE DATE: 09/26/2017  PREOPERATIVE DIAGNOSES: fibroid uterus, abnormal uterine bleeding  POSTOPERATIVE DIAGNOSES: The same  PROCEDURE: Vaginal hysterectomy converted to total abdominal hysterectomy, bilateral salpingectomy  SURGEON:  Feliz Beam, MD  ASSISTANT:  Darron Doom, MD  ANESTHESIOLOGY TEAM: Anesthesiologist: Audry Pili, MD CRNA: Hewitt Blade, CRNA; Alvy Bimler, CRNA  INDICATIONS: [name] is a 42 y.o. [phone] here for vaginal hysterectomy secondary to the indications listed under preoperative diagnoses; please see preoperative note for further details.  The risks of vaginal hysterectomy were discussed with the patient including but were not limited to: bleeding which may require transfusion or reoperation; infection which may require antibiotics; injury to bowel, bladder, ureters or other surrounding organs; need for additional procedures including conversion to laparotomy, incisional problems, thromboembolic phenomenon and other postoperative/anesthesia complications. She verbalizes understanding that removal of her uterus means she will not be able to get pregnant/carry a pregnancy. She consents to blood transfusion in the event of an emergency. The patient verbalized understanding of the plan, giving informed written consent for the procedure.    FINDINGS:  multiparous cervix with 18 weeks sized uterus with moderate descent, normal appearing fallopian tubes and ovaries bilaterally, specimen with weigh ~1150 gms  UPT: negative ANESTHESIA: General INTRAVENOUS FLUIDS: 2000 ml   ESTIMATED BLOOD LOSS: 750 ml URINE OUTPUT:  250 ml SPECIMENS: Specimen sent to pathology COMPLICATIONS: conversion to abdominal hysterectomy for bleeding and non-descent of uterus  PROCEDURE IN DETAIL:  The patient preoperatively received intravenous antibiotics and had sequential compression devices applied to her lower extremities.  She was then taken  to the operating room where she was placed under general anesthesia without difficulty. She was then placed in dorsal lithotomy position. She was then prepped and draped in sterile fashion and a foley catheter was placed in the bladder under sterile technique and attached to gravity drainage.  After an adequate timeout was performed, attention was turned to her pelvis.  A weighted speculum was then placed in the vagina, and the anterior and posterior lips of the cervix were grasped bilaterally with tenaculums.  The cervix was then injected circumferentially with vasopressin solution to maintain hemostasis.  The cervix was then circumferentially incised, and the bladder was dissected off the pubocervical fascia anteriorly without complication.  The anterior cul-de-sac was then attempted to be entered sharply however entry into the peritoneal cavity could not be obtained and attention as turned to the posterior cul de sac. The posterior cul-de-sac was entered sharply without difficulty.  A long weighted speculum was inserted into the posterior cul-de-sac.  The Heaney clamp was then used to clamp the uterosacral ligaments on either side.  They were then cut and sutured ligated with 0 Vicryl. Taking care to push the bladder off anteriorly and not catch it in any ligature, the cardinal ligaments were then clamped, cut and ligated. The uterine vessels were then serially clamped with the Heaney clamps, cut, and suture ligated on both sides. There was some bleeding noted throughout this portion of the procedure that improved with ligating the vessels. Attention was then turned anteriorly and the anterior cul de sac was entered sharply and a retractor placed into the anterior cul de sac, thereby lifting the bladder. At this point, the uterus would not further descend despite gently traction and bowel was visible at the posterior cul de sac. No bowel was palpated to be attached to the uterus, however it was not possible to  palpate the posterior  side of the uterus past the cervix. Additionally, there were several ligaments that could not be safely grasped and ligated with the Heaney clamps due to a lack of descent of the uterus. Given ongoing bleeding, decision made to convert to abdominal hysterectomy and OR staff and anesthesia alerted.   The patient was then taken out of dorsal lithotomy and placed in dorsal supine position. Her abdomen was prepped and draped in sterile fashion. After a brief timeout was performed, a Pfannenstiel skin incision was made with scalpel and carried through to the underlying layer of fascia. The fascia was incised in the midline, and this incision was extended bilaterally using the Mayo scissors.  Kocher clamps were applied to the superior aspect of the fascial incision and the underlying rectus muscles were dissected off bluntly.  The rectus muscles were separated in the midline bluntly and the peritoneum was entered bluntly. The peritoneal incision was carefully extended bluntly laterally and caudad with good visualization of the bladder. The uterus appeared bulky with multiple large leiomyomas and was gently exteriorized for improved visualization.  On exteriorization, it was apparent there was still significant attachments for the ligaments and uterine vessels on either side and some brisk bleeding noted with manipulation which improved with clamping. The round ligaments on each side were clamped, suture ligated, and transected allowing entry into the broad ligament. The lateral sides of the anterior leaf of the broad ligament were dissected down and carried medially to the cervix where the bladder had previously been dissected off the cervix. A retractor was placed over the bladder.  A hole was created in the clear portion of the posterior broad ligament. Adnexae were clamped on the patient's left side. This pedicle was then clamped, cut, and suture ligated with good hemostasis. This procedure  was repeated in an identical fashion on the right site allowing for both adnexa to remain in place.  The uterine arteries were then skeletonized bilaterally and then clamped, cut, and ligated with care given to prevent ureteral injury. Once the uterine arteries were ligated, the process was repeated with a remnant of the cardinal ligament on the right side, which was clamped, cut and ligated. Once this was finished, the uterus and cervix were free and removed from the field.  The vaginal cuff was closed with a series of interrupted 0 Vicryl figure-of-eight sutures anwith care given to incorporate the anterior pubocervical fascia and the posterior rectovaginal fascia as well as the uterosacral ligaments  The vaginal cuff angles were noted to have good hemostasis.  The ureters were then visualized and peristalsis noted bilaterally.  The left adnexa was grasped using a Babcock and the left fallopian tube was doubly clamped, cut and ligated from the adnexa using 0 vicryl. Small amount of bleeding was stopped with clamping and ligating of the pedicle. Inspection then showed hemostasis. The same procedure was then performed on the right adnexa and fallopian tube. Hemostasis noted at the left adnexa.The excised fallopian tubes were added to the specimen which was then sent to pathology.  The pelvis was irrigated and suctioned. Hemostasis was confirmed on all surfaces. Arista placed in pelvis for additional hemostasis. The fascia was then closed using 0 Vicryl in a running fashion.  The subcutaneous layer was irrigated, then reapproximated with 2-0 plain gut interrupted stitches.  The skin was closed with a 4-0 Monocryl subcuticular stitch. The patient tolerated the procedure well. Sponge, lap, instrument and needle counts were correct x 3.  She was taken to the recovery  room in stable condition.    Feliz Beam, M.D. Attending [place], [facility] for [place]. Arvilla Meres, M.D. Attending [place], [facility] for [employer], [place]

## 2017-09-26 NOTE — Anesthesia Preprocedure Evaluation (Signed)
Anesthesia Evaluation  Patient identified by MRN, date of birth, ID band Patient awake    Reviewed: Allergy & Precautions, NPO status , Patient's Chart, lab work & pertinent test results  Airway Mallampati: II  TM Distance: >3 FB Neck ROM: Full    Dental  (+) Dental Advisory Given   Pulmonary Current Smoker,    breath sounds clear to auscultation       Cardiovascular hypertension, Pt. on medications  Rhythm:Regular Rate:Normal     Neuro/Psych negative neurological ROS  negative psych ROS   GI/Hepatic Neg liver ROS, GERD  ,  Endo/Other  negative endocrine ROS Obesity   Renal/GU negative Renal ROS  negative genitourinary   Musculoskeletal negative musculoskeletal ROS (+)   Abdominal   Peds  Hematology  (+) anemia ,   Anesthesia Other Findings   Reproductive/Obstetrics  Leiomyoma of uterus                               Anesthesia Physical Anesthesia Plan  ASA: II  Anesthesia Plan: General   Post-op Pain Management:    Induction: Intravenous  PONV Risk Score and Plan: 4 or greater and Treatment may vary due to age or medical condition, Ondansetron, Dexamethasone, Midazolam and Scopolamine patch - Pre-op  Airway Management Planned: Oral ETT  Additional Equipment: None  Intra-op Plan:   Post-operative Plan: Extubation in OR  Informed Consent: I have reviewed the patients History and Physical, chart, labs and discussed the procedure including the risks, benefits and alternatives for the proposed anesthesia with the patient or authorized representative who has indicated his/her understanding and acceptance.   Dental advisory given  Plan Discussed with: CRNA and Anesthesiologist  Anesthesia Plan Comments:         Anesthesia Quick Evaluation

## 2017-09-26 NOTE — Anesthesia Postprocedure Evaluation (Signed)
Anesthesia Post Note  Patient: [name]  Procedure(s) Performed: ATTEMPETED HYSTERECTOMY VAGINAL WITH BILATERAL SALPINGECTOMY (Bilateral Vagina ) HYSTERECTOMY ABDOMINAL WITH BILATERAL SALPINGECTOMY (Bilateral Abdomen)     Patient location during evaluation: PACU Anesthesia Type: General Level of consciousness: awake and alert Pain management: pain level controlled Vital Signs Assessment: post-procedure vital signs reviewed and stable Respiratory status: spontaneous breathing, nonlabored ventilation and respiratory function stable Cardiovascular status: blood pressure returned to baseline and stable Postop Assessment: no apparent nausea or vomiting Anesthetic complications: no    Last Vitals:  Vitals:   09/26/17 1450 09/26/17 1500  BP: 121/68 118/72  Pulse: 78 84  Resp: 13 14  Temp: 37.1 C   SpO2: 100% 100%    Last Pain:  Vitals:   09/26/17 1500  TempSrc:   PainSc: Asleep   Pain Goal: Patients Stated Pain Goal: 3 (09/26/17 0829)               Audry Pili

## 2017-09-26 NOTE — OR Nursing (Signed)
DR. Rosana Hoes SWITCHED TO ABDOMINAL APPROACH AT 11:14AM. PT WAS REPREPPED, REDRAPED, AND NEW INSTRUMENTS,SPONGES AND SHARPS WERE COUNTED. NEW TIME OUT WAS ALSO PERFORMED BY DR. DAVIS.

## 2017-09-27 ENCOUNTER — Other Ambulatory Visit: Payer: Self-pay

## 2017-09-27 ENCOUNTER — Encounter (HOSPITAL_COMMUNITY): Payer: Self-pay | Admitting: Obstetrics and Gynecology

## 2017-09-27 LAB — TYPE AND SCREEN
ABO/RH(D): A POS
Antibody Screen: NEGATIVE
UNIT DIVISION: 0
UNIT DIVISION: 0
Unit division: 0

## 2017-09-27 LAB — CBC
HCT: 26.2 % — ABNORMAL LOW (ref 36.0–46.0)
HCT: 25.6 % — ABNORMAL LOW (ref 36.0–46.0)
HEMOGLOBIN: 8.7 g/dL — AB (ref 12.0–15.0)
Hemoglobin: 8.7 g/dL — ABNORMAL LOW (ref 12.0–15.0)
MCH: 30.6 pg (ref 26.0–34.0)
MCH: 31.3 pg (ref 26.0–34.0)
MCHC: 33.2 g/dL (ref 30.0–36.0)
MCHC: 34 g/dL (ref 30.0–36.0)
MCV: 92.3 fL (ref 78.0–100.0)
MCV: 92.1 fL (ref 78.0–100.0)
PLATELETS: 271 10*3/uL (ref 150–400)
PLATELETS: 248 10*3/uL (ref 150–400)
RBC: 2.84 MIL/uL — ABNORMAL LOW (ref 3.87–5.11)
RBC: 2.78 MIL/uL — AB (ref 3.87–5.11)
RDW: 16.6 % — AB (ref 11.5–15.5)
RDW: 17 % — ABNORMAL HIGH (ref 11.5–15.5)
WBC: 16.5 10*3/uL — AB (ref 4.0–10.5)
WBC: 17.5 10*3/uL — ABNORMAL HIGH (ref 4.0–10.5)

## 2017-09-27 LAB — BPAM RBC
BLOOD PRODUCT EXPIRATION DATE: 201907122359
Blood Product Expiration Date: 201907242359
Blood Product Expiration Date: 201907242359
ISSUE DATE / TIME: 201907021407
ISSUE DATE / TIME: 201907021446
ISSUE DATE / TIME: 201907021826
UNIT TYPE AND RH: 6200
Unit Type and Rh: 6200
Unit Type and Rh: 9500

## 2017-09-27 MED ORDER — IBUPROFEN 800 MG PO TABS: 800.0000 mg | ORAL_TABLET | Freq: Three times a day (TID) | ORAL | 1 refills | Status: DC | PRN

## 2017-09-27 MED ORDER — OXYCODONE-ACETAMINOPHEN 5-325 MG PO TABS: 1.0000 | ORAL_TABLET | Freq: Four times a day (QID) | ORAL | 0 refills | Status: DC | PRN

## 2017-09-27 NOTE — Progress Notes (Signed)
Gynecology Progress Note  Admission Date: 09/26/2017 Current Date: 09/27/2017 10:43 AM  [name] is a 42 y.o. [id number] POD#1 s/p TVH converted to TAH, BS. S/p 2 units pRBCS in PACU for H/H 6/18.  History complicated by: Patient Active Problem List   Diagnosis Date Noted  . Fibroid uterus 09/26/2017  . Heavy menstrual bleeding 09/26/2017  . Varicose veins of bilateral lower extremities with pain 12/25/2016  . Aortic heart murmur on examination 06/29/2015  . Obesity (BMI 30-39.9) 12/29/2013  . Hyperlipidemia with target LDL less than 100 12/29/2013  . Essential hypertension   . Heart palpitations   . Unspecified disorder of menstruation and other abnormal bleeding from female genital tract 05/07/2013  . Dysmenorrhea 05/07/2013  . BV (bacterial vaginosis) 05/07/2013  . Candidiasis of vulva and vagina 05/07/2013  . Leiomyoma of uterus, unspecified 04/25/2013    ROS and patient/family/surgical history, located on admission H&P note dated 09/26/2017, have been reviewed, and there are no changes except as noted below  Subjective:  Patient is feeling well this am. She reports well controlled pain on PO pain meds. She is ambulating and reports brief light-headedness shortly upon standing but it goes away shortly thereafter. She is passing flatus in small amounts, feels gas moving around in belly, has not had a Bm. She is voiding spontaneously. She is tolerating a regular diet without nausea/vomiting. Vaginal bleeding is minimal.   Objective:   Vitals:   09/26/17 2135 09/27/17 0014 09/27/17 0410 09/27/17 0735  BP: 134/78 124/70 132/73 120/71  Pulse: 87 86 78 95  Resp: 16 16 16 18   Temp: 99 F (37.2 C) 98.7 F (37.1 C) 98.7 F (37.1 C) 98.2 F (36.8 C)  TempSrc: Oral     SpO2: 100% 100% 100% 100%  Weight:      Height:        Total I/O In: 240 [P.O.:240] Out: 700 [Urine:700]  Intake/Output Summary (Last 24 hours) at 09/27/2017 1043 Last data filed at 09/27/2017  0955 Gross per 24 hour  Intake 2946.67 ml  Output 2975 ml  Net -28.33 ml     Physical exam: BP 120/71 (BP Location: Right Arm)   Pulse 95   Temp 98.2 F (36.8 C)   Resp 18   Ht 5' (1.524 m)   Wt 159 lb (72.1 kg)   LMP 06/03/2017   SpO2 100%   BMI 31.05 kg/m  CONSTITUTIONAL: Well-developed, well-nourished female in no acute distress.  HENT:  Normocephalic, atraumatic, External right and left ear normal. Oropharynx is clear and moist EYES: Conjunctivae and EOM are normal. Pupils are equal, round, and reactive to light. No scleral icterus.  NECK: Normal range of motion, supple, no masses.  Normal thyroid.  SKIN: Skin is warm and dry. No rash noted. Not diaphoretic. No erythema. No pallor. NEUROLOGIC: Alert and oriented to person, place, and time. Normal reflexes, muscle tone coordination. No cranial nerve deficit noted. PSYCHIATRIC: Normal mood and affect. Normal behavior. Normal judgment and thought content. CARDIOVASCULAR: Normal heart rate noted, regular rhythm RESPIRATORY: Clear to auscultation bilaterally. Effort and breath sounds normal, no problems with respiration noted. ABDOMEN: Soft, normal bowel sounds, no distention noted. incision clean/dry/intact with no evidence of active bleeding, appropriately tender, no rebound or guarding.  PELVIC: deferred MUSCULOSKELETAL: Normal range of motion. No tenderness.  No cyanosis, clubbing, or edema.     Labs  Recent Labs  Lab 09/26/17 1305 09/27/17 0143 09/27/17 0531  WBC 12.8* 16.5* 17.5*  HGB 6.1* 8.7*  8.7*  HCT 18.8* 26.2* 25.6*  PLT 263 271 248     Assessment & Plan:   Patient is 42 y.o. [id number] POD#1 s/p TVH converted to TAH, BS. Post op course complicated by low H/H, s/p 2 units pRBCs with stable H/H this am. She is doing very well this am, will monitor for flatus and pain management. Likely for dc home later today.  Pain meds prn Regular diet Activity as tolerated Simethicone prn Cont home HTN meds   Feliz Beam, M.D. Attending [place], [facility] for [employer], [place]

## 2017-09-27 NOTE — Discharge Instructions (Signed)
Abdominal Hysterectomy °Abdominal hysterectomy is a surgical procedure to remove the womb (uterus). The uterus is the muscular organ that houses a developing baby. This surgery may be done if: °· You have cancer. °· You have growths (tumors or fibroids) in the uterus. °· You have long-term (chronic) pain. °· You are bleeding. °· Your uterus has slipped down into your vagina (uterine prolapse). °· You have a condition in which the tissue that lines the uterus grows outside of its normal location (endometriosis). °· You have an infection in your uterus. °· You are having problems with your menstrual cycle. ° °Depending on why you are having this procedure, you may also have other reproductive organs removed. These could include: °· The part of your vagina that connects with your uterus (cervix). °· The organs that make eggs (ovaries). °· The tubes that connect the ovaries to the uterus (fallopian tubes). ° °Tell a health care provider about: °· Any allergies you have. °· All medicines you are taking, including vitamins, herbs, eye drops, creams, and over-the-counter medicines. °· Any problems you or family members have had with anesthetic medicines. °· Any blood disorders you have. °· Any surgeries you have had. °· Any medical conditions you have. °· Whether you are pregnant or may be pregnant. °What are the risks? °Generally, this is a safe procedure. However, problems may occur, including: °· Bleeding. °· Infection. °· Allergic reactions to medicines or dyes. °· Damage to other structures or organs. °· Nerve injury. °· Decreased interest in sex or pain during sex. °· Blood clots that can break free and travel to your lungs. ° °What happens before the procedure? °Staying hydrated °Follow instructions from your health care provider about hydration, which may include: °· Up to 2 hours before the procedure - you may continue to drink clear liquids, such as water, clear fruit juice, black coffee, and plain tea ° °Eating  and drinking restrictions °Follow instructions from your health care provider about eating and drinking, which may include: °· 8 hours before the procedure - stop eating heavy meals or foods such as meat, fried foods, or fatty foods. °· 6 hours before the procedure - stop eating light meals or foods, such as toast or cereal. °· 6 hours before the procedure - stop drinking milk or drinks that contain milk. °· 2 hours before the procedure - stop drinking clear liquids. ° °Medicines °· Ask your health care provider about: °? Changing or stopping your regular medicines. This is especially important if you are taking diabetes medicines or blood thinners. °? Taking medicines such as aspirin and ibuprofen. These medicines can thin your blood. Do not take these medicines before your procedure if your health care provider instructs you not to. °· You may be given antibiotic medicine to help prevent infection. Take it as told by your health care provider. °· You may be asked to take laxatives to prevent constipation. °General instructions °· Ask your health care provider how your surgical site will be marked or identified. °· You may be asked to shower with a germ-killing soap. °· Plan to have someone take you home from the hospital. °· Do not use any products that contain nicotine or tobacco, such as cigarettes and e-cigarettes. If you need help quitting, ask your health care provider. °· You may have an exam or testing. °· You may have a blood or urine sample taken. °· You may need to have an enema to clean out your rectum and lower colon. °· This   procedure can affect the way you feel about yourself. Talk to your health care provider about the physical and emotional changes this procedure may cause. °What happens during the procedure? °· To lower your risk of infection: °? Your health care team will wash or sanitize their hands. °? Your skin will be washed with soap. °? Hair may be removed from the surgical area. °· An IV  tube will be inserted into one of your veins. °· You will be given one or more of the following: °? A medicine to help you relax (sedative). °? A medicine to make you fall asleep (general anesthetic). °· Tight-fitting (compression) stockings will be placed on your legs to promote circulation. °· A thin, flexible tube (catheter) will be inserted to help drain your urine. °· The surgeon will make a cut (incision) through the skin in your lower belly. The incision may go side-to-side or up-and-down. °· The surgeon will move aside the body tissue that covers your uterus. The surgeon will then carefully take out your uterus along with any of the other organs that need to be removed. °· Bleeding will be controlled with clamps or sutures. °· The surgeon will close your incision with stitches (sutures), skin glue, or adhesive strips. °· A bandage (dressing) will be placed over the incision. °The procedure may vary among health care providers and hospitals. °What happens after the procedure? °· You will be given pain medicine as needed. °· Your blood pressure, heart rate, breathing rate, and blood oxygen level will be monitored until the medicines you were given have worn off. °· You will need to stay in the hospital to recover for one to two days. Ask your health care provider how long you will need to stay in the hospital after your procedure. °· You may have a liquid diet at first. You will most likely return to your usual diet the day after surgery. °· You will still have the urinary catheter in place. It will likely be removed the day after surgery. °· You may have to wear compression stockings. These stockings help to prevent blood clots and reduce swelling in your legs. °· You will be encouraged to walk as soon as possible. You will also use a device or do breathing exercises to keep your lungs clear. °· You may need to use a sanitary napkin for vaginal discharge. °Summary °· Abdominal hysterectomy is a surgical  procedure to remove the womb (uterus). The uterus is the muscular organ that houses a developing baby. °· This procedure can affect the way you feel about yourself. Talk to your health care provider about the physical and emotional changes this procedure may cause. °· You will be given medicines for pain after the procedure. °· You will need to stay in the hospital to recover. Ask your health care provider how long you will need to stay in the hospital after your procedure. °This information is not intended to replace advice given to you by your health care provider. Make sure you discuss any questions you have with your health care provider. °Document Released: 03/19/2013 Document Revised: 03/02/2016 Document Reviewed: 03/02/2016 °[employer] Interactive Patient Education © 2017 [employer] ° °

## 2017-09-27 NOTE — Discharge Summary (Signed)
Physician Discharge Summary  Patient ID: [name] MRN: [id number] DOB/AGE: January 22, 1976 42 y.o.  Admit date: 09/26/2017 Discharge date: 09/27/2017  Admission Diagnoses: fibroid uterus  Discharge Diagnoses:  Principal Problem:   Fibroid uterus Active Problems:   Leiomyoma of uterus, unspecified   Dysmenorrhea   Essential hypertension   Heavy menstrual bleeding   Discharged Condition: good  Hospital Course: Please see HPI dated 09/26/2017 for full details. Briefly, this is a 42 y.o. [id number] female admitted for surgical management of fibroid uterus. Vaginal hysterectomy converted to total abdominal hysterectomy, bilateral salpingectomy. Post op course complicated by acute blood loss anemia, received 2 units pRBCs. Subsequent H/H stable. By POD#1 she was eating without nausea/vomiting, voiding spontaneously without issue, passing flatus. Her pain was well controlled on PO pain meds and she was ambulating without difficulty.She was discharged home POD#1 in good condition. She will f/u in office  Medical history significant for HTN.  Physical Exam:  BP 126/67 (BP Location: Right Arm)   Pulse 79   Temp 98.2 F (36.8 C)   Resp 18   Ht 5' (1.524 m)   Wt 159 lb (72.1 kg)   LMP 06/03/2017   SpO2 100%   BMI 31.05 kg/m  General: alert, oriented, cooperative Chest: CTAB, normal respiratory effort Heart: RRR  Abdomen: soft, appropriately tender to palpation, incision covered by dressing with no evidence of active bleeding  DVT Evaluation: no evidence of DVT Extremities: no edema, o calf tenderness   Labs: Lab Results  Component Value Date   WBC 17.5 (H) 09/27/2017   HGB 8.7 (L) 09/27/2017   HCT 25.6 (L) 09/27/2017   MCV 92.1 09/27/2017   PLT 248 09/27/2017   CMP Latest Ref Rng & Units 09/26/2017  Glucose 65 - 99 mg/dL -  BUN 6 - 20 mg/dL -  Creatinine 0.44 - 1.00 mg/dL 0.63  Sodium 135 - 145 mmol/L -  Potassium 3.5 - 5.1 mmol/L -  Chloride 101 - 111 mmol/L -  CO2 22 -  32 mmol/L -  Calcium 8.9 - 10.3 mg/dL -      Disposition: Discharge disposition: 01-Home or Self Care       Discharge Instructions    Call MD for:  difficulty breathing, headache or visual disturbances   Complete by:  As directed    Call MD for:  extreme fatigue   Complete by:  As directed    Call MD for:  hives   Complete by:  As directed    Call MD for:  persistant dizziness or light-headedness   Complete by:  As directed    Call MD for:  persistant nausea and vomiting   Complete by:  As directed    Call MD for:  redness, tenderness, or signs of infection (pain, swelling, redness, odor or green/yellow discharge around incision site)   Complete by:  As directed    Call MD for:  severe uncontrolled pain   Complete by:  As directed    Call MD for:  temperature >100.4   Complete by:  As directed    Diet - low sodium heart healthy   Complete by:  As directed    Increase activity slowly   Complete by:  As directed      An After Visit Summary was printed and given to the patient. Allergies as of 09/27/2017   No Known Allergies     Medication List    STOP taking these medications   HYDROcodone-acetaminophen 5-325 MG tablet Commonly  known as:  NORCO/VICODIN   medroxyPROGESTERone 150 MG/ML injection Commonly known as:  DEPO-PROVERA   megestrol 40 MG tablet Commonly known as:  MEGACE   oxyCODONE-acetaminophen 10-325 MG tablet Commonly known as:  PERCOCET Replaced by:  oxyCODONE-acetaminophen 5-325 MG tablet     TAKE these medications   cyclobenzaprine 10 MG tablet Commonly known as:  FLEXERIL Take 1 tablet (10 mg total) by mouth 2 (two) times daily as needed for muscle spasms.   diltiazem 180 MG 24 hr capsule Commonly known as:  CARDIZEM CD Take 1 capsule (180 mg total) by mouth daily. What changed:  when to take this   FERRALET 90 90-1 MG Tabs Take 1 tablet by mouth daily before breakfast.   hydrochlorothiazide 25 MG tablet Commonly known as:   HYDRODIURIL Take 1 tablet (25 mg total) by mouth daily.   ibuprofen 800 MG tablet Commonly known as:  ADVIL,MOTRIN Take 1 tablet (800 mg total) by mouth every 8 (eight) hours as needed (mild pain). What changed:  reasons to take this   oxyCODONE-acetaminophen 5-325 MG tablet Commonly known as:  PERCOCET/ROXICET Take 1 tablet by mouth every 6 (six) hours as needed for severe pain ((when tolerating fluids)). Replaces:  oxyCODONE-acetaminophen 10-325 MG tablet   PRENATAL PLUS IRON 29-1 MG Tabs Take 1 tablet by mouth daily before breakfast.      Follow-up Information    [facility]. Schedule an appointment as soon as possible for a visit in 2 week(s).   Specialty:  Obstetrics and Gynecology Why:  with Dr. Augustina Mood information: [address] [place] [phone]          Signed: Sloan Leiter 09/27/2017, 1:22 PM

## 2017-09-27 NOTE — Progress Notes (Signed)
Discharge teaching complete with pt. Pt understood all information and did not have any questions. Pt discharged home to family. 

## 2017-10-12 ENCOUNTER — Encounter: Payer: Self-pay | Admitting: Obstetrics and Gynecology

## 2017-10-12 ENCOUNTER — Ambulatory Visit (INDEPENDENT_AMBULATORY_CARE_PROVIDER_SITE_OTHER): Payer: BLUE CROSS/BLUE SHIELD | Admitting: Obstetrics and Gynecology

## 2017-10-12 VITALS — Ht 60.0 in | Wt 159.3 lb

## 2017-10-12 DIAGNOSIS — N898 Other specified noninflammatory disorders of vagina: Secondary | ICD-10-CM | POA: Diagnosis not present

## 2017-10-12 DIAGNOSIS — D219 Benign neoplasm of connective and other soft tissue, unspecified: Secondary | ICD-10-CM

## 2017-10-12 DIAGNOSIS — Z9889 Other specified postprocedural states: Secondary | ICD-10-CM

## 2017-10-12 NOTE — Progress Notes (Signed)
GYNECOLOGY OFFICE FOLLOW UP NOTE  History:  42 y.o. [id number] here today for follow up for vaginal hysterectomy converted to abdominal hysterectomy, bilateral salpingectomy 09/26/17. Post op course c/b acute blood loss abnemia for which she received 2 units pRBCs. Today she is doing well. Pain is moderately well controlled, some days worse than others. She is taking ibuprofen, denies need for anything stronger. Reports occasional twinges of pain with urinating. She is passing gas and having bowel movements, using stool softeners as need. Denies light-headedness or dizziness. Had some spotting after surgery but no bleeding since.   Past Medical History:  Diagnosis Date  . Anemia   . Essential hypertension   . Fibroids 2012  . GERD (gastroesophageal reflux disease)    diet controlled, no meds  . Heart palpitations    Noted to have PVCs and no arrhythmia on cardiac monitor -not taking beta blocker;  ECHO December 2013: EF 55-60% with mild LA ablation. Otherwise normal., no current problems  . SVD (spontaneous vaginal delivery)    x 2  . Syphilis   . Trichomonal infection     Past Surgical History:  Procedure Laterality Date  . CHOLECYSTECTOMY    . EYE SURGERY Left    retinal detachment  . GALLBLADDER SURGERY    . HYSTERECTOMY ABDOMINAL WITH SALPINGECTOMY Bilateral 09/26/2017   Procedure: HYSTERECTOMY ABDOMINAL WITH BILATERAL SALPINGECTOMY;  Surgeon: Sloan Leiter, MD;  Location: [facility];  Service: Gynecology;  Laterality: Bilateral;  . VAGINAL HYSTERECTOMY Bilateral 09/26/2017   Procedure: ATTEMPETED HYSTERECTOMY VAGINAL WITH BILATERAL SALPINGECTOMY;  Surgeon: Sloan Leiter, MD;  Location: [facility];  Service: Gynecology;  Laterality: Bilateral;     Current Outpatient Medications:  .  cyclobenzaprine (FLEXERIL) 10 MG tablet, Take 1 tablet (10 mg total) by mouth 2 (two) times daily as needed for muscle spasms., Disp: 20 tablet, Rfl: 0 .  diltiazem (CARDIZEM CD) 180 MG 24 hr capsule, Take 1  capsule (180 mg total) by mouth daily. (Patient taking differently: Take 180 mg by mouth at bedtime. ), Disp: 90 capsule, Rfl: 3 .  Fe Cbn-Fe Gluc-FA-B12-C-DSS (FERRALET 90) 90-1 MG TABS, Take 1 tablet by mouth daily before breakfast., Disp: 30 each, Rfl: 5 .  hydrochlorothiazide (HYDRODIURIL) 25 MG tablet, Take 1 tablet (25 mg total) by mouth daily., Disp: 90 tablet, Rfl: 3 .  ibuprofen (ADVIL,MOTRIN) 800 MG tablet, Take 1 tablet (800 mg total) by mouth every 8 (eight) hours as needed (mild pain)., Disp: 30 tablet, Rfl: 1 .  oxyCODONE-acetaminophen (PERCOCET/ROXICET) 5-325 MG tablet, Take 1 tablet by mouth every 6 (six) hours as needed for severe pain ((when tolerating fluids))., Disp: 30 tablet, Rfl: 0 .  Prenatal Vit-Iron Carbonyl-FA (PRENATAL PLUS IRON) 29-1 MG TABS, Take 1 tablet by mouth daily before breakfast., Disp: 30 tablet, Rfl: 11  The following portions of the patient's history were reviewed and updated as appropriate: allergies, current medications, past family history, past medical history, past social history, past surgical history and problem list.   Review of Systems:  Pertinent items noted in HPI and remainder of comprehensive ROS otherwise negative.   Objective:  Physical Exam Ht 5' (1.524 m)   Wt 159 lb 4.8 oz (72.3 kg)   BMI 31.11 kg/m  CONSTITUTIONAL: Well-developed, well-nourished female in no acute distress.  HENT:  Normocephalic, atraumatic. External right and left ear normal. Oropharynx is clear and moist EYES: Conjunctivae and EOM are normal. Pupils are equal, round, and reactive to light. No scleral icterus.  NECK: Normal  range of motion, supple, no masses SKIN: Skin is warm and dry. No rash noted. Not diaphoretic. No erythema. No pallor. NEUROLOGIC: Alert and oriented to person, place, and time. Normal reflexes, muscle tone coordination. No cranial nerve deficit noted. PSYCHIATRIC: Normal mood and affect. Normal behavior. Normal judgment and thought  content. CARDIOVASCULAR: Normal heart rate noted RESPIRATORY: Effort normal, no problems with respiration noted ABDOMEN: Soft, no distention noted. Incision intact with no erythema or separation, appropriately tender to palpation PELVIC: Normal appearing external genitalia; normal appearing vaginal mucosa, cervis surgically absent, cuff appears intact, scant thick white discharge in vagina, pelvic cultures obtained. Cuff intact. MUSCULOSKELETAL: Normal range of motion. No edema noted.  Labs and Imaging Diagnosis Uterus, cervix and bilateral fallopian tubes - BENIGN CERVIX WITH MILD CHRONIC CERVICITIS. - BENIGN ENDOMETRIUM WITH EXOGENOUS HORMONE EFFECT. - LEIOMYOMATA WITH PROMINENT DEGENERATIVE CHANGES. - BENIGN FALLOPIAN TUBES WITH UNILATERAL MITOTICALLY ACTIVE LEIOMYOMA (0.7 CM). Susanne Greenhouse MD Pathologist, Electronic Signature (Case signed 09/27/2017)  Assessment & Plan:  1. Post-operative state Doing well Pain well managed Cuff intact Return 4 weeks for f/u  2. Leiomyoma Path with mitotically active leiomyoma, will review with Gyn Onc  3. Vaginal discharge - Cervicovaginal ancillary only   Routine preventative health maintenance measures emphasized. Please refer to After Visit Summary for other counseling recommendations.   Return in about 1 month (around 11/09/2017) for Followup.   Feliz Beam, M.D. [facility]

## 2017-10-17 LAB — CERVICOVAGINAL ANCILLARY ONLY
BACTERIAL VAGINITIS: POSITIVE — AB
Candida vaginitis: NEGATIVE

## 2017-10-18 MED ORDER — METRONIDAZOLE 500 MG PO TABS: 500.0000 mg | ORAL_TABLET | Freq: Two times a day (BID) | ORAL | 0 refills | Status: DC

## 2017-10-18 NOTE — Addendum Note (Signed)
Addended by: Vivien Rota on: 10/18/2017 08:41 AM   Modules accepted: Orders

## 2017-11-02 ENCOUNTER — Telehealth: Payer: Self-pay

## 2017-11-02 NOTE — Telephone Encounter (Signed)
Pt called requesting a letter stating that she is still under care, and that she has not been released to return to work. Pt was seen for a follow up on 7/18, and it didn't state rather or not patient was okay to return work or remain out. Please advise.  Pt would like letter faxed to [place] at [phone].

## 2017-11-02 NOTE — Telephone Encounter (Signed)
-----   Message from Sloan Leiter, MD sent at 11/02/2017  3:03 PM EDT ----- Hi, This lady is not cleared to return to work. Please send letter as requested.

## 2017-11-02 NOTE — Telephone Encounter (Signed)
Letter written and faxed to [facility]  At [phone]

## 2017-11-16 ENCOUNTER — Encounter: Payer: Self-pay | Admitting: Obstetrics and Gynecology

## 2017-11-16 ENCOUNTER — Ambulatory Visit: Payer: BLUE CROSS/BLUE SHIELD | Admitting: Obstetrics and Gynecology

## 2017-11-16 VITALS — BP 157/92 | HR 80 | Wt 162.0 lb

## 2017-11-16 DIAGNOSIS — Z90710 Acquired absence of both cervix and uterus: Secondary | ICD-10-CM

## 2017-11-16 DIAGNOSIS — R1030 Lower abdominal pain, unspecified: Secondary | ICD-10-CM

## 2017-11-16 DIAGNOSIS — Z9889 Other specified postprocedural states: Secondary | ICD-10-CM

## 2017-11-16 NOTE — Progress Notes (Signed)
GYNECOLOGY OFFICE FOLLOW UP NOTE  History:  42 y.o. [id number] here today for follow up for vaginal hysterectomy converted to abdominal hysterectomy, bilateral salpingectomy 09/26/17. She is doing well, still with some pain, some days worse than others. Has not started any strenuous exercise yet. Appetite has returned, bowel function normal except she does note constipation. Has had intercourse, reports no pain or bleeding. Denies urinary issues. Denies other complaints.    Past Medical History:  Diagnosis Date  . Anemia   . Essential hypertension   . Fibroids 2012  . GERD (gastroesophageal reflux disease)    diet controlled, no meds  . Heart palpitations    Noted to have PVCs and no arrhythmia on cardiac monitor -not taking beta blocker;  ECHO December 2013: EF 55-60% with mild LA ablation. Otherwise normal., no current problems  . SVD (spontaneous vaginal delivery)    x 2  . Syphilis   . Trichomonal infection     Past Surgical History:  Procedure Laterality Date  . CHOLECYSTECTOMY    . EYE SURGERY Left    retinal detachment  . GALLBLADDER SURGERY    . HYSTERECTOMY ABDOMINAL WITH SALPINGECTOMY Bilateral 09/26/2017   Procedure: HYSTERECTOMY ABDOMINAL WITH BILATERAL SALPINGECTOMY;  Surgeon: Sloan Leiter, MD;  Location: [facility];  Service: Gynecology;  Laterality: Bilateral;  . VAGINAL HYSTERECTOMY Bilateral 09/26/2017   Procedure: ATTEMPETED HYSTERECTOMY VAGINAL WITH BILATERAL SALPINGECTOMY;  Surgeon: Sloan Leiter, MD;  Location: [facility];  Service: Gynecology;  Laterality: Bilateral;     Current Outpatient Medications:  .  diltiazem (CARDIZEM CD) 180 MG 24 hr capsule, Take 1 capsule (180 mg total) by mouth daily. (Patient taking differently: Take 180 mg by mouth at bedtime. ), Disp: 90 capsule, Rfl: 3 .  Fe Cbn-Fe Gluc-FA-B12-C-DSS (FERRALET 90) 90-1 MG TABS, Take 1 tablet by mouth daily before breakfast., Disp: 30 each, Rfl: 5 .  hydrochlorothiazide (HYDRODIURIL) 25 MG tablet, Take 1  tablet (25 mg total) by mouth daily., Disp: 90 tablet, Rfl: 3 .  ibuprofen (ADVIL,MOTRIN) 800 MG tablet, Take 1 tablet (800 mg total) by mouth every 8 (eight) hours as needed (mild pain)., Disp: 30 tablet, Rfl: 1 .  Prenatal Vit-Iron Carbonyl-FA (PRENATAL PLUS IRON) 29-1 MG TABS, Take 1 tablet by mouth daily before breakfast., Disp: 30 tablet, Rfl: 11 .  cyclobenzaprine (FLEXERIL) 10 MG tablet, Take 1 tablet (10 mg total) by mouth 2 (two) times daily as needed for muscle spasms. (Patient not taking: Reported on 11/16/2017), Disp: 20 tablet, Rfl: 0  The following portions of the patient's history were reviewed and updated as appropriate: allergies, current medications, past family history, past medical history, past social history, past surgical history and problem list.   Review of Systems:  Pertinent items noted in HPI and remainder of comprehensive ROS otherwise negative.   Objective:  Physical Exam BP (!) 157/92   Pulse 80   Wt 162 lb (73.5 kg)   BMI 31.64 kg/m  CONSTITUTIONAL: Well-developed, well-nourished female in no acute distress.  HENT:  Normocephalic, atraumatic. External right and left ear normal. Oropharynx is clear and moist EYES: Conjunctivae and EOM are normal. Pupils are equal, round, and reactive to light. No scleral icterus.  NECK: Normal range of motion, supple, no masses SKIN: Skin is warm and dry. No rash noted. Not diaphoretic. No erythema. No pallor. NEUROLOGIC: Alert and oriented to person, place, and time. Normal reflexes, muscle tone coordination. No cranial nerve deficit noted. PSYCHIATRIC: Normal mood and affect. Normal behavior. Normal  judgment and thought content. CARDIOVASCULAR: Normal heart rate noted RESPIRATORY: Effort and breath sounds normal, no problems with respiration noted ABDOMEN: Soft, no distention noted. Well healed pfannenstiel incision, no separation, erythema, induration PELVIC: Normal appearing external genitalia; normal appearing vaginal  mucosa, cervix surgically absent.  No abnormal discharge noted.  Uterus surgically absent,  no adnexal tenderness. MUSCULOSKELETAL: Normal range of motion. No edema noted.  Labs and Imaging No results found.  Assessment & Plan:   1. Post-operative state Doing well, still with some pain Cuff well healed Okay for intercourse  2. Lower abdominal pain - Urine Culture  3. S/P hysterectomy Reviewed path with patient, benign, no need for further workup  Patient cleared to return to work 11/28/17.  Routine preventative health maintenance measures emphasized. Please refer to After Visit Summary for other counseling recommendations.   Return in about 1 year (around 11/17/2018), or if symptoms worsen or fail to improve.   Feliz Beam, M.D. [facility]

## 2017-11-18 LAB — URINE CULTURE: ORGANISM ID, BACTERIA: NO GROWTH

## 2018-01-31 ENCOUNTER — Other Ambulatory Visit: Payer: Self-pay | Admitting: Cardiology

## 2018-02-02 NOTE — Telephone Encounter (Signed)
Rx request sent to pharmacy.  

## 2018-03-01 ENCOUNTER — Other Ambulatory Visit: Payer: Self-pay | Admitting: *Deleted

## 2018-03-02 MED ORDER — DILTIAZEM HCL ER COATED BEADS 180 MG PO CP24: 180.0000 mg | ORAL_CAPSULE | Freq: Every day | ORAL | 0 refills | Status: DC

## 2018-03-30 ENCOUNTER — Other Ambulatory Visit: Payer: Self-pay | Admitting: Cardiology

## 2018-04-05 ENCOUNTER — Other Ambulatory Visit: Payer: Self-pay | Admitting: Cardiology

## 2018-04-16 ENCOUNTER — Other Ambulatory Visit: Payer: Self-pay | Admitting: Cardiology

## 2018-04-17 ENCOUNTER — Telehealth: Payer: Self-pay

## 2018-04-17 MED ORDER — FLUCONAZOLE 150 MG PO TABS: 150.0000 mg | ORAL_TABLET | Freq: Once | ORAL | 0 refills | Status: AC

## 2018-04-17 NOTE — Telephone Encounter (Signed)
Returned call and pt stated that she recently took antibiotics and has vaginal itching. Pt requesting rx for yeast, sent per protocol.

## 2018-05-21 ENCOUNTER — Ambulatory Visit: Payer: BLUE CROSS/BLUE SHIELD | Admitting: Obstetrics

## 2018-05-21 ENCOUNTER — Encounter: Payer: Self-pay | Admitting: Obstetrics

## 2018-05-21 VITALS — Wt 167.9 lb

## 2018-05-21 DIAGNOSIS — B9689 Other specified bacterial agents as the cause of diseases classified elsewhere: Secondary | ICD-10-CM | POA: Diagnosis not present

## 2018-05-21 DIAGNOSIS — B373 Candidiasis of vulva and vagina: Secondary | ICD-10-CM

## 2018-05-21 DIAGNOSIS — Z113 Encounter for screening for infections with a predominantly sexual mode of transmission: Secondary | ICD-10-CM | POA: Diagnosis not present

## 2018-05-21 DIAGNOSIS — N760 Acute vaginitis: Secondary | ICD-10-CM

## 2018-05-21 DIAGNOSIS — R102 Pelvic and perineal pain: Secondary | ICD-10-CM

## 2018-05-21 DIAGNOSIS — N898 Other specified noninflammatory disorders of vagina: Secondary | ICD-10-CM | POA: Diagnosis not present

## 2018-05-21 DIAGNOSIS — R300 Dysuria: Secondary | ICD-10-CM

## 2018-05-21 DIAGNOSIS — Z90710 Acquired absence of both cervix and uterus: Secondary | ICD-10-CM

## 2018-05-21 DIAGNOSIS — D500 Iron deficiency anemia secondary to blood loss (chronic): Secondary | ICD-10-CM | POA: Diagnosis not present

## 2018-05-21 DIAGNOSIS — B3731 Acute candidiasis of vulva and vagina: Secondary | ICD-10-CM

## 2018-05-21 LAB — POCT URINALYSIS DIPSTICK
BILIRUBIN UA: NEGATIVE
GLUCOSE UA: NEGATIVE
Ketones, UA: NEGATIVE
Leukocytes, UA: NEGATIVE
Nitrite, UA: NEGATIVE
Protein, UA: NEGATIVE
SPEC GRAV UA: 1.01 (ref 1.010–1.025)
Urobilinogen, UA: 0.2 E.U./dL
pH, UA: 7 (ref 5.0–8.0)

## 2018-05-21 MED ORDER — CEFUROXIME AXETIL 500 MG PO TABS: 500.0000 mg | ORAL_TABLET | Freq: Two times a day (BID) | ORAL | 0 refills | Status: DC

## 2018-05-21 MED ORDER — CLINDAMYCIN HCL 300 MG PO CAPS: 300.0000 mg | ORAL_CAPSULE | Freq: Three times a day (TID) | ORAL | 0 refills | Status: DC

## 2018-05-21 MED ORDER — FLUCONAZOLE 150 MG PO TABS: 150.0000 mg | ORAL_TABLET | Freq: Once | ORAL | 0 refills | Status: AC

## 2018-05-21 NOTE — Progress Notes (Signed)
Patient is in the office complaining of sharp, left sided pelvic pain that radiates to her back that started today. Pt states that it comes and goes. Pt requests std testing.

## 2018-05-21 NOTE — Addendum Note (Signed)
Addended by: Tristan Schroeder D on: 05/21/2018 05:04 PM   Modules accepted: Orders

## 2018-05-21 NOTE — Progress Notes (Signed)
Patient ID: [name], female   DOB: 09-19-1975, 43 y.o.   MRN: [id number]  Chief Complaint  Patient presents with  . GYN    HPI [name] is a 43 y.o. female.  Pelvic pain during intercourse.  Also has pain and difficulty with urination.  She had a TAH / BS ~ 1 year ago. HPI  Past Medical History:  Diagnosis Date  . Anemia   . Essential hypertension   . Fibroids 2012  . GERD (gastroesophageal reflux disease)    diet controlled, no meds  . Heart palpitations    Noted to have PVCs and no arrhythmia on cardiac monitor -not taking beta blocker;  ECHO December 2013: EF 55-60% with mild LA ablation. Otherwise normal., no current problems  . SVD (spontaneous vaginal delivery)    x 2  . Syphilis   . Trichomonal infection     Past Surgical History:  Procedure Laterality Date  . CHOLECYSTECTOMY    . EYE SURGERY Left    retinal detachment  . GALLBLADDER SURGERY    . HYSTERECTOMY ABDOMINAL WITH SALPINGECTOMY Bilateral 09/26/2017   Procedure: HYSTERECTOMY ABDOMINAL WITH BILATERAL SALPINGECTOMY;  Surgeon: Sloan Leiter, MD;  Location: [facility];  Service: Gynecology;  Laterality: Bilateral;  . VAGINAL HYSTERECTOMY Bilateral 09/26/2017   Procedure: ATTEMPETED HYSTERECTOMY VAGINAL WITH BILATERAL SALPINGECTOMY;  Surgeon: Sloan Leiter, MD;  Location: [facility];  Service: Gynecology;  Laterality: Bilateral;    Family History  Problem Relation Age of Onset  . Hypertension Father   . Early death Father   . Diabetes Mother     Social History Social History   Tobacco Use  . Smoking status: Current Every Day Smoker    Packs/day: 0.50    Years: 10.00    Pack years: 5.00    Types: Cigarettes    Start date: 05/29/2003  . Smokeless tobacco: Never Used  . Tobacco comment: smoke about 4 a day  Substance Use Topics  . Alcohol use: Yes    Alcohol/week: 2.0 standard drinks    Types: 2 Standard drinks or equivalent per week    Comment: Occasionaly   . Drug use: No    No Known  Allergies  Current Outpatient Medications  Medication Sig Dispense Refill  . CARTIA XT 180 MG 24 hr capsule TAKE 1 CAPSULE BY MOUTH ONCE DAILY 90 capsule 1  . Fe Cbn-Fe Gluc-FA-B12-C-DSS (FERRALET 90) 90-1 MG TABS Take 1 tablet by mouth daily before breakfast. 30 each 5  . hydrochlorothiazide (HYDRODIURIL) 25 MG tablet TAKE 1 TABLET BY MOUTH ONCE DAILY 90 tablet 0  . ibuprofen (ADVIL,MOTRIN) 800 MG tablet Take 1 tablet (800 mg total) by mouth every 8 (eight) hours as needed (mild pain). 30 tablet 1  . cefUROXime (CEFTIN) 500 MG tablet Take 1 tablet (500 mg total) by mouth 2 (two) times daily with a meal. 14 tablet 0  . clindamycin (CLEOCIN) 300 MG capsule Take 1 capsule (300 mg total) by mouth 3 (three) times daily. 21 capsule 0  . cyclobenzaprine (FLEXERIL) 10 MG tablet Take 1 tablet (10 mg total) by mouth 2 (two) times daily as needed for muscle spasms. (Patient not taking: Reported on 11/16/2017) 20 tablet 0  . fluconazole (DIFLUCAN) 150 MG tablet Take 1 tablet (150 mg total) by mouth once for 1 dose. 1 tablet 0  . Prenatal Vit-Iron Carbonyl-FA (PRENATAL PLUS IRON) 29-1 MG TABS Take 1 tablet by mouth daily before breakfast. (Patient not taking: Reported on 05/21/2018) 30 tablet  11   No current facility-administered medications for this visit.     Review of Systems Review of Systems Constitutional: negative for fatigue and weight loss Respiratory: negative for cough and wheezing Cardiovascular: negative for chest pain, fatigue and palpitations Gastrointestinal: negative for abdominal pain and change in bowel habits Genitourinary:POSITIVE pelvic pain and pain with urination Integument/breast: negative for nipple discharge Musculoskeletal:negative for myalgias Neurological: negative for gait problems and tremors Behavioral/Psych: negative for abusive relationship, depression Endocrine: negative for temperature intolerance      Weight 167 lb 14.4 oz (76.2 kg), last menstrual period  06/09/2017.  Physical Exam Physical Exam           General:  Alert and no distress Abdomen:  normal findings: no organomegaly, soft, non-tender and no hernia  Pelvis:  External genitalia: normal general appearance Urinary system: urethral meatus normal and bladder without fullness, nontender Vaginal: normal without tenderness, induration or masses Cervix: ABSENT Adnexa: normal bimanual exam Uterus: ABSENT                            BIMANUAL:  POSITIVE SUPRAPUBIC TENDERNESS  50% of 15 min visit spent on counseling and coordination of care.   Data Reviewed Wet Prep Urinalysis  Assessment     1. Pelvic pain Rx: - US PELVIC COMPLETE WITH TRANSVAGINAL; Future  2. S/P TAH (total abdominal hysterectomy)  3. Dysuria Rx: - cefUROXime (CEFTIN) 500 MG tablet; Take 1 tablet (500 mg total) by mouth 2 (two) times daily with a meal.  Dispense: 14 tablet; Refill: 0 - Urine Culture  4. Vaginal discharge Rx: - Cervicovaginal ancillary only( [facility]) - clindamycin (CLEOCIN) 300 MG capsule; Take 1 capsule (300 mg total) by mouth 3 (three) times daily.  Dispense: 21 capsule; Refill: 0  5. Anemia due to chronic blood loss Rx: - CBC - Comprehensive metabolic panel - Ferritin  6. Candida vaginitis Rx: - fluconazole (DIFLUCAN) 150 MG tablet; Take 1 tablet (150 mg total) by mouth once for 1 dose.  Dispense: 1 tablet; Refill: 0    Plan    Follow up in 2 weeks  Orders Placed This Encounter  Procedures  . US PELVIC COMPLETE WITH TRANSVAGINAL    Standing Status:   Future    Standing Expiration Date:   07/20/2019    Order Specific Question:   Reason for Exam (SYMPTOM  OR DIAGNOSIS REQUIRED)    Answer:   Pelvic pain.  S/P TAH / Bilateral Salpingectomy    Order Specific Question:   Preferred imaging location?    Answer:   [facility]  . CBC  . Comprehensive metabolic panel  . Ferritin   Meds ordered this encounter  Medications  . clindamycin (CLEOCIN) 300 MG capsule     Sig: Take 1 capsule (300 mg total) by mouth 3 (three) times daily.    Dispense:  21 capsule    Refill:  0  . cefUROXime (CEFTIN) 500 MG tablet    Sig: Take 1 tablet (500 mg total) by mouth 2 (two) times daily with a meal.    Dispense:  14 tablet    Refill:  0  . fluconazole (DIFLUCAN) 150 MG tablet    Sig: Take 1 tablet (150 mg total) by mouth once for 1 dose.    Dispense:  1 tablet    Refill:  0    Shelly Bombard MD 05-21-2018

## 2018-05-22 LAB — COMPREHENSIVE METABOLIC PANEL
ALBUMIN: 4.4 g/dL (ref 3.8–4.8)
ALK PHOS: 61 IU/L (ref 39–117)
ALT: 15 IU/L (ref 0–32)
AST: 18 IU/L (ref 0–40)
Albumin/Globulin Ratio: 1.8 (ref 1.2–2.2)
BUN / CREAT RATIO: 17 (ref 9–23)
BUN: 11 mg/dL (ref 6–24)
Bilirubin Total: 0.3 mg/dL (ref 0.0–1.2)
CO2: 26 mmol/L (ref 20–29)
CREATININE: 0.65 mg/dL (ref 0.57–1.00)
Calcium: 9.7 mg/dL (ref 8.7–10.2)
Chloride: 101 mmol/L (ref 96–106)
GFR calc Af Amer: 125 mL/min/{1.73_m2} (ref >59)
GFR calc non Af Amer: 108 mL/min/{1.73_m2} (ref >59)
GLUCOSE: 78 mg/dL (ref 65–99)
Globulin, Total: 2.5 g/dL (ref 1.5–4.5)
Potassium: 3.3 mmol/L — ABNORMAL LOW (ref 3.5–5.2)
Sodium: 142 mmol/L (ref 134–144)
Total Protein: 6.9 g/dL (ref 6.0–8.5)

## 2018-05-22 LAB — FERRITIN: FERRITIN: 58 ng/mL (ref 15–150)

## 2018-05-22 LAB — CBC
Hematocrit: 39.3 % (ref 34.0–46.6)
Hemoglobin: 13.9 g/dL (ref 11.1–15.9)
MCH: 32 pg (ref 26.6–33.0)
MCHC: 35.4 g/dL (ref 31.5–35.7)
MCV: 90 fL (ref 79–97)
Platelets: 301 10*3/uL (ref 150–450)
RBC: 4.35 x10E6/uL (ref 3.77–5.28)
RDW: 12 % (ref 11.7–15.4)
WBC: 8.5 10*3/uL (ref 3.4–10.8)

## 2018-05-23 LAB — URINE CULTURE: Organism ID, Bacteria: NO GROWTH

## 2018-05-24 ENCOUNTER — Other Ambulatory Visit: Payer: Self-pay | Admitting: Obstetrics

## 2018-05-24 LAB — CERVICOVAGINAL ANCILLARY ONLY
Bacterial vaginitis: POSITIVE — AB
CANDIDA VAGINITIS: NEGATIVE
Chlamydia: NEGATIVE
NEISSERIA GONORRHEA: NEGATIVE
Trichomonas: NEGATIVE

## 2018-05-29 ENCOUNTER — Other Ambulatory Visit: Payer: BLUE CROSS/BLUE SHIELD

## 2018-06-04 ENCOUNTER — Inpatient Hospital Stay: Admission: RE | Admit: 2018-06-04 | Payer: BLUE CROSS/BLUE SHIELD | Source: Ambulatory Visit

## 2018-06-07 ENCOUNTER — Encounter: Payer: Self-pay | Admitting: Obstetrics and Gynecology

## 2018-06-07 ENCOUNTER — Other Ambulatory Visit: Payer: Self-pay

## 2018-06-07 ENCOUNTER — Ambulatory Visit: Payer: BLUE CROSS/BLUE SHIELD | Admitting: Obstetrics and Gynecology

## 2018-06-07 VITALS — BP 151/96 | HR 84 | Wt 169.0 lb

## 2018-06-07 DIAGNOSIS — R1900 Intra-abdominal and pelvic swelling, mass and lump, unspecified site: Secondary | ICD-10-CM | POA: Diagnosis not present

## 2018-06-07 DIAGNOSIS — N750 Cyst of Bartholin's gland: Secondary | ICD-10-CM | POA: Diagnosis not present

## 2018-06-07 DIAGNOSIS — R102 Pelvic and perineal pain: Secondary | ICD-10-CM

## 2018-06-07 NOTE — Progress Notes (Signed)
GYNECOLOGY OFFICE FOLLOW UP NOTE  History:  43 y.o. [id number] here today for follow up for TAH/BS in 09/2017. Seen by Dr. Jodi Benson for pelvic pain and bulge in vagina last month, diagnosed with BV, yeast infection, UTI. Today, she reports she has not had pain with intercourse recently. Denies discharge or bleeding. Reports pain she had in left abdomen has subsided, no further pain. Saw PCP who felt that her abdominal pain was related to hypokalemia.   Past Medical History:  Diagnosis Date  . Anemia   . Essential hypertension   . Fibroids 2012  . GERD (gastroesophageal reflux disease)    diet controlled, no meds  . Heart palpitations    Noted to have PVCs and no arrhythmia on cardiac monitor -not taking beta blocker;  ECHO December 2013: EF 55-60% with mild LA ablation. Otherwise normal., no current problems  . SVD (spontaneous vaginal delivery)    x 2  . Syphilis   . Trichomonal infection     Past Surgical History:  Procedure Laterality Date  . CHOLECYSTECTOMY    . EYE SURGERY Left    retinal detachment  . GALLBLADDER SURGERY    . HYSTERECTOMY ABDOMINAL WITH SALPINGECTOMY Bilateral 09/26/2017   Procedure: HYSTERECTOMY ABDOMINAL WITH BILATERAL SALPINGECTOMY;  Surgeon: Tina Leiter, MD;  Location: [facility];  Service: Gynecology;  Laterality: Bilateral;  . VAGINAL HYSTERECTOMY Bilateral 09/26/2017   Procedure: ATTEMPETED HYSTERECTOMY VAGINAL WITH BILATERAL SALPINGECTOMY;  Surgeon: Tina Leiter, MD;  Location: [facility];  Service: Gynecology;  Laterality: Bilateral;     Current Outpatient Medications:  .  CARTIA XT 180 MG 24 hr capsule, TAKE 1 CAPSULE BY MOUTH ONCE DAILY, Disp: 90 capsule, Rfl: 1 .  cefUROXime (CEFTIN) 500 MG tablet, Take 1 tablet (500 mg total) by mouth 2 (two) times daily with a meal., Disp: 14 tablet, Rfl: 0 .  clindamycin (CLEOCIN) 300 MG capsule, Take 1 capsule (300 mg total) by mouth 3 (three) times daily., Disp: 21 capsule, Rfl: 0 .  cyclobenzaprine (FLEXERIL) 10 MG  tablet, Take 1 tablet (10 mg total) by mouth 2 (two) times daily as needed for muscle spasms. (Patient not taking: Reported on 11/16/2017), Disp: 20 tablet, Rfl: 0 .  Fe Cbn-Fe Gluc-FA-B12-C-DSS (FERRALET 90) 90-1 MG TABS, Take 1 tablet by mouth daily before breakfast., Disp: 30 each, Rfl: 5 .  hydrochlorothiazide (HYDRODIURIL) 25 MG tablet, TAKE 1 TABLET BY MOUTH ONCE DAILY, Disp: 90 tablet, Rfl: 0 .  ibuprofen (ADVIL,MOTRIN) 800 MG tablet, Take 1 tablet (800 mg total) by mouth every 8 (eight) hours as needed (mild pain)., Disp: 30 tablet, Rfl: 1 .  Prenatal Vit-Iron Carbonyl-FA (PRENATAL PLUS IRON) 29-1 MG TABS, Take 1 tablet by mouth daily before breakfast. (Patient not taking: Reported on 05/21/2018), Disp: 30 tablet, Rfl: 11  The following portions of the patient's history were reviewed and updated as appropriate: allergies, current medications, past family history, past medical history, past social history, past surgical history and problem list.   Review of Systems:  Pertinent items noted in HPI and remainder of comprehensive ROS otherwise negative.   Objective:  Physical Exam BP (!) 151/96   Pulse 84   Wt 169 lb (76.7 kg)   LMP 06/09/2017   BMI 33.01 kg/m  CONSTITUTIONAL: Well-developed, well-nourished female in no acute distress.  HENT:  Normocephalic, atraumatic. External right and left ear normal. Oropharynx is clear and moist EYES: Conjunctivae and EOM are normal. Pupils are equal, round, and reactive to light. No scleral icterus.  NECK: Normal range of motion, supple, no masses SKIN: Skin is warm and dry. No rash noted. Not diaphoretic. No erythema. No pallor. NEUROLOGIC: Alert and oriented to person, place, and time. Normal reflexes, muscle tone coordination. No cranial nerve deficit noted. PSYCHIATRIC: Normal mood and affect. Normal behavior. Normal judgment and thought content. CARDIOVASCULAR: Normal heart rate noted RESPIRATORY: Effort normal, no problems with respiration  noted ABDOMEN: Soft, no distention noted.   PELVIC: Normal appearing external genitalia; normal appearing vaginal mucosa with enlarged left bartholins cyst approx 2 cm x 2 cm, soft and non-tender, cervix surgically absent, cuff in place.  Moderate amount white discharge. Uterus surgically absent, no other palpable masses, no uterine or adnexal tenderness. MUSCULOSKELETAL: Normal range of motion. No edema noted.  Labs and Imaging No results found.  Assessment & Plan:  1. Pelvic pain Denies current pain, last time had intercourse, no pain  2. Bartholin's cyst Non-tender left bartholins' cyst noted today, patient would like it drained, offered to drain today but patient declines drainage today as she is concerned it will hurt - return to clinic for drainage  3. Pelvic mass No mass felt on exam today, will obtain TVUS to rule out pelvic mass felt on prior exam   Routine preventative health maintenance measures emphasized. Please refer to After Visit Summary for other counseling recommendations.   Return in about 4 weeks (around 07/05/2018).   [name], M.D. Attending Center for [employer] [facility])

## 2018-06-07 NOTE — Progress Notes (Signed)
RGYN pt presents for problem visit today Vaginal HYST converted to abdominal HYST  09/26/2017.  Recently seen on 05/21/18 for pelvic pain during intercourse. U/S was ordered by Dr.Harper and scheduled  05/29/18 and 06/04/18 both were cancelled/no show for 06/04/18 u/s appt.   Patient complains of .pelvic pain , today pain is 5/10x. Pt states she no longer has discomfort during intercourse.

## 2018-06-13 ENCOUNTER — Other Ambulatory Visit: Payer: BLUE CROSS/BLUE SHIELD

## 2018-06-22 ENCOUNTER — Ambulatory Visit: Payer: BLUE CROSS/BLUE SHIELD | Admitting: Obstetrics and Gynecology

## 2018-09-06 ENCOUNTER — Other Ambulatory Visit: Payer: Self-pay | Admitting: Obstetrics

## 2018-09-06 DIAGNOSIS — Z1231 Encounter for screening mammogram for malignant neoplasm of breast: Secondary | ICD-10-CM

## 2018-09-19 ENCOUNTER — Ambulatory Visit
Admission: RE | Admit: 2018-09-19 | Discharge: 2018-09-19 | Disposition: A | Discharge status: Home / Self Care | Payer: BLUE CROSS/BLUE SHIELD | Source: Ambulatory Visit | Attending: Obstetrics | Admitting: Obstetrics

## 2018-09-19 ENCOUNTER — Other Ambulatory Visit: Payer: Self-pay

## 2018-09-19 DIAGNOSIS — Z1231 Encounter for screening mammogram for malignant neoplasm of breast: Secondary | ICD-10-CM

## 2018-11-06 ENCOUNTER — Encounter: Payer: Self-pay | Admitting: Obstetrics

## 2018-11-23 ENCOUNTER — Telehealth: Payer: Self-pay | Admitting: *Deleted

## 2018-11-23 NOTE — Telephone Encounter (Signed)
Pt called to office requesting tx for BV.   Please send Rx if approved.

## 2018-11-26 ENCOUNTER — Other Ambulatory Visit: Payer: Self-pay | Admitting: Obstetrics

## 2018-11-26 DIAGNOSIS — N898 Other specified noninflammatory disorders of vagina: Secondary | ICD-10-CM

## 2018-11-26 MED ORDER — CLINDAMYCIN HCL 300 MG PO CAPS: 300.0000 mg | ORAL_CAPSULE | Freq: Three times a day (TID) | ORAL | 2 refills | Status: DC

## 2018-11-26 NOTE — Telephone Encounter (Signed)
Clindamycin Rx for BV 

## 2019-01-21 ENCOUNTER — Ambulatory Visit: Payer: BLUE CROSS/BLUE SHIELD | Admitting: Obstetrics

## 2019-01-21 ENCOUNTER — Encounter: Payer: Self-pay | Admitting: Obstetrics

## 2019-01-21 ENCOUNTER — Other Ambulatory Visit: Payer: Self-pay

## 2019-01-21 VITALS — BP 157/92 | HR 76 | Ht 60.0 in | Wt 174.0 lb

## 2019-01-21 DIAGNOSIS — L700 Acne vulgaris: Secondary | ICD-10-CM | POA: Diagnosis not present

## 2019-01-21 DIAGNOSIS — Z0000 Encounter for general adult medical examination without abnormal findings: Secondary | ICD-10-CM

## 2019-01-21 DIAGNOSIS — A539 Syphilis, unspecified: Secondary | ICD-10-CM

## 2019-01-21 MED ORDER — CLINDAMYCIN PHOSPHATE 1 % EX SOLN
Freq: Two times a day (BID) | CUTANEOUS | 2 refills | Status: DC
Start: 2019-01-21 — End: 2019-05-28

## 2019-01-21 MED ORDER — PRENATAL PLUS 27-1 MG PO TABS: 1.0000 | ORAL_TABLET | Freq: Every day | ORAL | 11 refills | Status: DC

## 2019-01-21 MED ORDER — PENICILLIN G BENZATHINE 2400000 UNIT/4ML IM SUSP
2.4000 10*6.[IU] | INTRAMUSCULAR | 2 refills | Status: DC
Start: 2019-01-21 — End: 2019-05-28

## 2019-01-21 NOTE — Progress Notes (Signed)
Pt is here for symptoms of sore on the outer vaginal area, pt reports it has been there for almost a week. Pt reports the area is slightly painful.

## 2019-01-21 NOTE — Progress Notes (Signed)
Patient ID: [name], female   DOB: 07-18-1975, 44 y.o.   MRN: [id number]  Chief Complaint  Patient presents with  . Vaginitis    HPI [name] is a 43 y.o. female.  Sore on vulva just like the sore that appeared when she was diagnosed with syphilis a year ago.  She has had recurrence of these sores ~ twice over the past year, untreated.  HPI  Past Medical History:  Diagnosis Date  . Anemia   . Essential hypertension   . Fibroids 2012  . GERD (gastroesophageal reflux disease)    diet controlled, no meds  . Heart palpitations    Noted to have PVCs and no arrhythmia on cardiac monitor -not taking beta blocker;  ECHO December 2013: EF 55-60% with mild LA ablation. Otherwise normal., no current problems  . SVD (spontaneous vaginal delivery)    x 2  . Syphilis   . Trichomonal infection     Past Surgical History:  Procedure Laterality Date  . CHOLECYSTECTOMY    . EYE SURGERY Left    retinal detachment  . GALLBLADDER SURGERY    . HYSTERECTOMY ABDOMINAL WITH SALPINGECTOMY Bilateral 09/26/2017   Procedure: HYSTERECTOMY ABDOMINAL WITH BILATERAL SALPINGECTOMY;  Surgeon: Sloan Leiter, MD;  Location: [facility];  Service: Gynecology;  Laterality: Bilateral;  . VAGINAL HYSTERECTOMY Bilateral 09/26/2017   Procedure: ATTEMPETED HYSTERECTOMY VAGINAL WITH BILATERAL SALPINGECTOMY;  Surgeon: Sloan Leiter, MD;  Location: [facility];  Service: Gynecology;  Laterality: Bilateral;    Family History  Problem Relation Age of Onset  . Hypertension Father   . Early death Father   . Diabetes Mother     Social History Social History   Tobacco Use  . Smoking status: Current Every Day Smoker    Packs/day: 0.50    Years: 10.00    Pack years: 5.00    Types: Cigarettes    Start date: 05/29/2003  . Smokeless tobacco: Never Used  . Tobacco comment: smoke about 4 a day  Substance Use Topics  . Alcohol use: Yes    Alcohol/week: 2.0 standard drinks    Types: 2 Standard drinks or  equivalent per week    Comment: Occasionaly   . Drug use: No    No Known Allergies  Current Outpatient Medications  Medication Sig Dispense Refill  . CARTIA XT 180 MG 24 hr capsule TAKE 1 CAPSULE BY MOUTH ONCE DAILY 90 capsule 1  . hydrochlorothiazide (HYDRODIURIL) 25 MG tablet TAKE 1 TABLET BY MOUTH ONCE DAILY 90 tablet 0  . cefUROXime (CEFTIN) 500 MG tablet Take 1 tablet (500 mg total) by mouth 2 (two) times daily with a meal. (Patient not taking: Reported on 01/21/2019) 14 tablet 0  . clindamycin (CLEOCIN) 300 MG capsule Take 1 capsule (300 mg total) by mouth 3 (three) times daily. (Patient not taking: Reported on 01/21/2019) 21 capsule 2  . clindamycin (CLEOCIN-T) 1 % external solution Apply topically 2 (two) times daily. 30 mL 2  . cyclobenzaprine (FLEXERIL) 10 MG tablet Take 1 tablet (10 mg total) by mouth 2 (two) times daily as needed for muscle spasms. (Patient not taking: Reported on 11/16/2017) 20 tablet 0  . Fe Cbn-Fe Gluc-FA-B12-C-DSS (FERRALET 90) 90-1 MG TABS Take 1 tablet by mouth daily before breakfast. (Patient not taking: Reported on 01/21/2019) 30 each 5  . ibuprofen (ADVIL,MOTRIN) 800 MG tablet Take 1 tablet (800 mg total) by mouth every 8 (eight) hours as needed (mild pain). (Patient not taking: Reported on 01/21/2019)  30 tablet 1  . Penicillin G Benzathine 2400000 UNIT/4ML SUSP Inject 4 mLs (2,400,000 Units total) into the muscle once a week. 4 mL 2  . Potassium Chloride ER 20 MEQ TBCR Take 1 tablet by mouth daily.    . Prenatal Vit-Iron Carbonyl-FA (PRENATAL PLUS IRON) 29-1 MG TABS Take 1 tablet by mouth daily before breakfast. (Patient not taking: Reported on 05/21/2018) 30 tablet 11  . prenatal vitamin w/FE, FA (PRENATAL 1 + 1) 27-1 MG TABS tablet Take 1 tablet by mouth daily before breakfast. 30 tablet 11   No current facility-administered medications for this visit.     Review of Systems Review of Systems Constitutional: negative for fatigue and weight  loss Respiratory: negative for cough and wheezing Cardiovascular: negative for chest pain, fatigue and palpitations Gastrointestinal: negative for abdominal pain and change in bowel habits Genitourinary: positive for vulva sore Integument/breast: negative for nipple discharge Musculoskeletal:negative for myalgias Neurological: negative for gait problems and tremors Behavioral/Psych: negative for abusive relationship, depression Endocrine: negative for temperature intolerance      Blood pressure (!) 157/92, pulse 76, height 5' (1.524 m), weight 174 lb (78.9 kg), last menstrual period 06/09/2017.  Physical Exam Physical Exam                Alert and no distress Pelvis:  External genitalia: healing sore left vulva, nontender Urinary system: urethral meatus normal and bladder without fullness, nontender Vaginal: normal without tenderness, induration or masses Cervix: normal appearance Adnexa: normal bimanual exam Uterus: anteverted and non-tender, normal size    50% of 15 min visit spent on counseling and coordination of care.   Data Reviewed Labs  Assessment     1. 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  Dispense: 4 mL; Refill: 2  2. Acne comedone Rx: - clindamycin (CLEOCIN-T) 1 % external solution; Apply topically 2 (two) times daily.  Dispense: 30 mL; Refill: 2 - Ambulatory referral to Dermatology  3. Routine adult health maintenance Rx: - Comprehensive metabolic panel - CBC - prenatal vitamin w/FE, FA (PRENATAL 1 + 1) 27-1 MG TABS tablet; Take 1 tablet by mouth daily before breakfast.  Dispense: 30 tablet; Refill: 11    Plan    Orders Placed This Encounter  Procedures  . RPR  . Comprehensive metabolic panel  . CBC  . Ambulatory referral to Dermatology    Referral Priority:   Routine    Referral Type:    Consultation    Referral Reason:   Specialty Services Required    Requested Specialty:   Dermatology    Number of Visits Requested:   1   Meds ordered this encounter  Medications  . clindamycin (CLEOCIN-T) 1 % external solution    Sig: Apply topically 2 (two) times daily.    Dispense:  30 mL    Refill:  2  . Penicillin G Benzathine 2400000 UNIT/4ML SUSP    Sig: Inject 4 mLs (2,400,000 Units total) into the muscle once a week.    Dispense:  4 mL    Refill:  2  . prenatal vitamin w/FE, FA (PRENATAL 1 + 1) 27-1 MG TABS tablet    Sig: Take 1 tablet by mouth daily before breakfast.    Dispense:  30 tablet    Refill:  11     Shelly Bombard, MD 01/21/2019  4:38 PM

## 2019-01-22 LAB — COMPREHENSIVE METABOLIC PANEL
ALT: 17 IU/L (ref 0–32)
AST: 16 IU/L (ref 0–40)
Albumin/Globulin Ratio: 1.8 (ref 1.2–2.2)
Albumin: 4.4 g/dL (ref 3.8–4.8)
Alkaline Phosphatase: 74 IU/L (ref 39–117)
BUN/Creatinine Ratio: 18 (ref 9–23)
BUN: 14 mg/dL (ref 6–24)
Bilirubin Total: 0.3 mg/dL (ref 0.0–1.2)
CO2: 25 mmol/L (ref 20–29)
Calcium: 9.8 mg/dL (ref 8.7–10.2)
Chloride: 102 mmol/L (ref 96–106)
Creatinine, Ser: 0.77 mg/dL (ref 0.57–1.00)
GFR calc Af Amer: 108 mL/min/{1.73_m2} (ref >59)
GFR calc non Af Amer: 94 mL/min/{1.73_m2} (ref >59)
Globulin, Total: 2.5 g/dL (ref 1.5–4.5)
Glucose: 104 mg/dL — ABNORMAL HIGH (ref 65–99)
Potassium: 3.4 mmol/L — ABNORMAL LOW (ref 3.5–5.2)
Sodium: 140 mmol/L (ref 134–144)
Total Protein: 6.9 g/dL (ref 6.0–8.5)

## 2019-01-22 LAB — CBC
Hematocrit: 39.6 % (ref 34.0–46.6)
Hemoglobin: 13.5 g/dL (ref 11.1–15.9)
MCH: 30.2 pg (ref 26.6–33.0)
MCHC: 34.1 g/dL (ref 31.5–35.7)
MCV: 89 fL (ref 79–97)
Platelets: 358 10*3/uL (ref 150–450)
RBC: 4.47 x10E6/uL (ref 3.77–5.28)
RDW: 12.2 % (ref 11.7–15.4)
WBC: 9.2 10*3/uL (ref 3.4–10.8)

## 2019-01-23 LAB — RPR, QUANT+TP ABS (REFLEX)
Rapid Plasma Reagin, Quant: 1:4 {titer} — ABNORMAL HIGH
T Pallidum Abs: REACTIVE — AB

## 2019-01-23 LAB — RPR: RPR Ser Ql: REACTIVE — AB

## 2019-02-18 ENCOUNTER — Other Ambulatory Visit: Payer: BLUE CROSS/BLUE SHIELD

## 2019-02-25 ENCOUNTER — Other Ambulatory Visit: Payer: Self-pay

## 2019-02-25 ENCOUNTER — Ambulatory Visit (INDEPENDENT_AMBULATORY_CARE_PROVIDER_SITE_OTHER): Payer: BLUE CROSS/BLUE SHIELD

## 2019-02-25 ENCOUNTER — Encounter (HOSPITAL_COMMUNITY): Payer: Self-pay

## 2019-02-25 ENCOUNTER — Ambulatory Visit (HOSPITAL_COMMUNITY)
Admission: EM | Admit: 2019-02-25 | Discharge: 2019-02-25 | Disposition: A | Discharge status: Home / Self Care | Payer: BLUE CROSS/BLUE SHIELD | Attending: Family Medicine | Admitting: Family Medicine

## 2019-02-25 DIAGNOSIS — S62327A Displaced fracture of shaft of fifth metacarpal bone, left hand, initial encounter for closed fracture: Secondary | ICD-10-CM

## 2019-02-25 NOTE — ED Triage Notes (Signed)
Pt presents with left hand & wrist injury after falling last night.

## 2019-02-25 NOTE — Discharge Instructions (Addendum)
You have a fracture to the left hand We are placing a splint here in clinic.  You need to follow-up with orthopedics for further management.  You can do ibuprofen for pain, inflammation and swelling.  Make sure you are icing the area multiple times a day.

## 2019-02-26 NOTE — ED Provider Notes (Signed)
[facility]    CSN: [id number] Arrival date & time: 02/25/19  [id number]      History   Chief Complaint Chief Complaint  Patient presents with  . Hand Injury    HPI [name] is a 43 y.o. female.   Pt is a 43 year old female that presents with left hand injury. This occurred yesterday when she was carrying some bags up the stairs and fell with left hand outstretched. The hand is painful and swollen. Limited ROM. No numbness, tingling. Took ibuprofen and iced with some relief.   ROS per HPI      Past Medical History:  Diagnosis Date  . Anemia   . Essential hypertension   . Fibroids 2012  . GERD (gastroesophageal reflux disease)    diet controlled, no meds  . Heart palpitations    Noted to have PVCs and no arrhythmia on cardiac monitor -not taking beta blocker;  ECHO December 2013: EF 55-60% with mild LA ablation. Otherwise normal., no current problems  . SVD (spontaneous vaginal delivery)    x 2  . Syphilis   . Trichomonal infection     Patient Active Problem List   Diagnosis Date Noted  . Fibroid uterus 09/26/2017  . Heavy menstrual bleeding 09/26/2017  . Varicose veins of bilateral lower extremities with pain 12/25/2016  . Aortic heart murmur on examination 06/29/2015  . Obesity (BMI 30-39.9) 12/29/2013  . Hyperlipidemia with target LDL less than 100 12/29/2013  . Essential hypertension   . Heart palpitations   . Unspecified disorder of menstruation and other abnormal bleeding from female genital tract 05/07/2013  . Dysmenorrhea 05/07/2013  . BV (bacterial vaginosis) 05/07/2013  . Candidiasis of vulva and vagina 05/07/2013  . Leiomyoma of uterus, unspecified 04/25/2013    Past Surgical History:  Procedure Laterality Date  . CHOLECYSTECTOMY    . EYE SURGERY Left    retinal detachment  . GALLBLADDER SURGERY    . HYSTERECTOMY ABDOMINAL WITH SALPINGECTOMY Bilateral 09/26/2017   Procedure: HYSTERECTOMY ABDOMINAL WITH BILATERAL  SALPINGECTOMY;  Surgeon: Sloan Leiter, MD;  Location: [facility];  Service: Gynecology;  Laterality: Bilateral;  . VAGINAL HYSTERECTOMY Bilateral 09/26/2017   Procedure: ATTEMPETED HYSTERECTOMY VAGINAL WITH BILATERAL SALPINGECTOMY;  Surgeon: Sloan Leiter, MD;  Location: [facility];  Service: Gynecology;  Laterality: Bilateral;    OB History    Gravida  2   Para  2   Term  2   Preterm      AB      Living  2     SAB      TAB      Ectopic      Multiple      Live Births  2            Home Medications    Prior to Admission medications   Medication Sig Start Date End Date Taking? Authorizing Provider  CARTIA XT 180 MG 24 hr capsule TAKE 1 CAPSULE BY MOUTH ONCE DAILY 04/05/18   Leonie Man, MD  clindamycin (CLEOCIN-T) 1 % external solution Apply topically 2 (two) times daily. 01/21/19   Shelly Bombard, MD  hydrochlorothiazide (HYDRODIURIL) 25 MG tablet TAKE 1 TABLET BY MOUTH ONCE DAILY 03/30/18   Leonie Man, MD  Penicillin G Benzathine 2400000 UNIT/4ML SUSP Inject 4 mLs (2,400,000 Units total) into the muscle once a week. 01/21/19   Shelly Bombard, MD  Potassium Chloride ER 20 MEQ TBCR Take 1 tablet by mouth  daily. 01/12/19   [provider]  prenatal vitamin w/FE, FA (PRENATAL 1 + 1) 27-1 MG TABS tablet Take 1 tablet by mouth daily before breakfast. 01/21/19   Shelly Bombard, MD    Family History Family History  Problem Relation Age of Onset  . Hypertension Father   . Early death Father   . Diabetes Mother     Social History Social History   Tobacco Use  . Smoking status: Current Every Day Smoker    Packs/day: 0.50    Years: 10.00    Pack years: 5.00    Types: Cigarettes    Start date: 05/29/2003  . Smokeless tobacco: Never Used  . Tobacco comment: smoke about 4 a day  Substance Use Topics  . Alcohol use: Yes    Alcohol/week: 2.0 standard drinks    Types: 2 Standard drinks or equivalent per week    Comment: Occasionaly   . Drug use:  No     Allergies   Patient has no known allergies.   Review of Systems Review of Systems   Physical Exam Triage Vital Signs ED Triage Vitals  Enc Vitals Group     BP 02/25/19 0859 (!) 144/92     Pulse Rate 02/25/19 0859 87     Resp 02/25/19 0859 18     Temp 02/25/19 0859 98.6 F (37 C)     Temp Source 02/25/19 0859 Oral     SpO2 02/25/19 0859 98 %     Weight --      Height --      Head Circumference --      Peak Flow --      Pain Score 02/25/19 0901 9     Pain Loc --      Pain Edu? --      Excl. in [place]? --    No data found.  Updated Vital Signs BP (!) 144/92 (BP Location: Right Arm)   Pulse 87   Temp 98.6 F (37 C) (Oral)   Resp 18   LMP 06/09/2017   SpO2 98%   Visual Acuity Right Eye Distance:   Left Eye Distance:   Bilateral Distance:    Right Eye Near:   Left Eye Near:    Bilateral Near:     Physical Exam Vitals signs and nursing note reviewed.  Constitutional:      General: She is not in acute distress.    Appearance: Normal appearance. She is not ill-appearing, toxic-appearing or diaphoretic.  HENT:     Head: Normocephalic.     Nose: Nose normal.     Mouth/Throat:     Pharynx: Oropharynx is clear.  Eyes:     Conjunctiva/sclera: Conjunctivae normal.  Neck:     Musculoskeletal: Normal range of motion.  Pulmonary:     Effort: Pulmonary effort is normal.  Musculoskeletal:        General: Swelling, tenderness and signs of injury present.     Left hand: She exhibits tenderness, bony tenderness and swelling.       Hands:     Comments: CNS intact with good cap refill and radial pulse 2+  Skin:    General: Skin is warm and dry.     Findings: No rash.  Neurological:     Mental Status: She is alert.  Psychiatric:        Mood and Affect: Mood normal.      UC Treatments / Results  Labs (all labs ordered are listed, but only  abnormal results are displayed) Labs Reviewed - No data to display  EKG   Radiology Dg Hand Complete Left   Result Date: 02/25/2019 CLINICAL DATA:  Left hand pain and swelling after fall last night. EXAM: LEFT HAND - COMPLETE 3+ VIEW COMPARISON:  None. FINDINGS: Acute oblique fracture of the distal fifth metacarpal shaft with 5 mm of overriding. No significant volar angulation. No intra-articular extension. No additional fracture. No dislocation. Joint spaces are preserved. Bone mineralization is normal. Soft tissue swelling of the dorsal and ulnar hand. IMPRESSION: 1. Acute minimally displaced fracture of the distal fifth metacarpal shaft. Electronically Signed   By: Titus Dubin M.D.   On: 02/25/2019 09:20    Procedures Procedures (including critical care time)  Medications Ordered in UC Medications - No data to display  Initial Impression / Assessment and Plan / UC Course  I have reviewed the triage vital signs and the nursing notes.  Pertinent labs & imaging results that were available during my care of the patient were reviewed by me and considered in my medical decision making (see chart for details).     Closed displaced fifth metacarpal fracture. Placing in ulna gutter splint and will have her follow up with ortho. Marland Kitchen RICE Ibuprofen.  Return precautions given Final Clinical Impressions(s) / UC Diagnoses   Final diagnoses:  Closed displaced fracture of shaft of fifth metacarpal bone of left hand, initial encounter     Discharge Instructions     You have a fracture to the left hand We are placing a splint here in clinic.  You need to follow-up with orthopedics for further management.  You can do ibuprofen for pain, inflammation and swelling.  Make sure you are icing the area multiple times a day.    ED Prescriptions    None     PDMP not reviewed this encounter.   Orvan July, NP 02/26/19 [phone]

## 2019-02-28 ENCOUNTER — Other Ambulatory Visit: Payer: BLUE CROSS/BLUE SHIELD

## 2019-05-27 HISTORY — PX: TRANSTHORACIC ECHOCARDIOGRAM: SHX275

## 2019-05-27 NOTE — Progress Notes (Signed)
Virtual Visit via Telephone Note   This visit type was conducted due to national recommendations for restrictions regarding the COVID-19 Pandemic (e.g. social distancing) in an effort to limit this patient's exposure and mitigate transmission in our community.  Due to her co-morbid illnesses, this patient is at least at moderate risk for complications without adequate follow up.  This format is felt to be most appropriate for this patient at this time.  The patient did not have access to video technology/had technical difficulties with video requiring transitioning to audio format only (telephone).  All issues noted in this document were discussed and addressed.  No physical exam could be performed with this format.  Please refer to the patient's chart for her  consent to telehealth for [facility].   Date:  05/28/2019   ID:  [name], DOB 05/17/1975, MRN [id number]  Patient Location: Home Provider Location: Home  PCP:  Tina Ebbs, MD  Cardiologist:  Dr. Ellyn Hack  Electrophysiologist:  None   Evaluation Performed:  Follow-Up Visit  Chief Complaint:  Follow Up  History of Present Illness:    [name] is a 44 y.o. female we are following for ongoing assessment and management of hypertension, HL, and palpitations. At that office visit on 12/02/2018, she was found to have an AoV murmur and echocardiogram was ordered by Dr. Ellyn Hack She was also recommended to begin wearing support hose for varicose veins.   She continues to complain of palpitations. She is drinking caffeinated beverages. She states that her BP has "been okay" each time she is seen by her PCP or gynecologist. She denies chest pain, dizziness, DOE, fatigue or malaise. She is medically compliant. Labs are completed by her PCP every 6 months.   The patient does not have symptoms concerning for COVID-19 infection (fever, chills, cough, or new shortness of breath).    Past Medical History:    Diagnosis Date  . Anemia   . Essential hypertension   . Fibroids 2012  . GERD (gastroesophageal reflux disease)    diet controlled, no meds  . Heart palpitations    Noted to have PVCs and no arrhythmia on cardiac monitor -not taking beta blocker;  ECHO December 2013: EF 55-60% with mild LA ablation. Otherwise normal., no current problems  . SVD (spontaneous vaginal delivery)    x 2  . Syphilis   . Trichomonal infection    Past Surgical History:  Procedure Laterality Date  . CHOLECYSTECTOMY    . EYE SURGERY Left    retinal detachment  . GALLBLADDER SURGERY    . HYSTERECTOMY ABDOMINAL WITH SALPINGECTOMY Bilateral 09/26/2017   Procedure: HYSTERECTOMY ABDOMINAL WITH BILATERAL SALPINGECTOMY;  Surgeon: Sloan Leiter, MD;  Location: [facility];  Service: Gynecology;  Laterality: Bilateral;  . VAGINAL HYSTERECTOMY Bilateral 09/26/2017   Procedure: ATTEMPETED HYSTERECTOMY VAGINAL WITH BILATERAL SALPINGECTOMY;  Surgeon: Sloan Leiter, MD;  Location: [facility];  Service: Gynecology;  Laterality: Bilateral;     Current Meds  Medication Sig  . diltiazem (CARTIA XT) 180 MG 24 hr capsule Take 1 capsule (180 mg total) by mouth daily.  . hydrochlorothiazide (HYDRODIURIL) 25 MG tablet TAKE 1 TABLET BY MOUTH ONCE DAILY  . Potassium Chloride ER 20 MEQ TBCR Take 1 tablet by mouth daily.  . prenatal vitamin w/FE, FA (PRENATAL 1 + 1) 27-1 MG TABS tablet Take 1 tablet by mouth daily before breakfast.  . [DISCONTINUED] CARTIA XT 180 MG 24 hr capsule TAKE 1 CAPSULE BY MOUTH ONCE DAILY  Allergies:   Patient has no known allergies.   Social History   Tobacco Use  . Smoking status: Current Every Day Smoker    Packs/day: 0.50    Years: 10.00    Pack years: 5.00    Types: Cigarettes    Start date: 05/29/2003  . Smokeless tobacco: Never Used  . Tobacco comment: smoke about 4 a day  Substance Use Topics  . Alcohol use: Yes    Alcohol/week: 2.0 standard drinks    Types: 2 Standard drinks or equivalent per  week    Comment: Occasionaly   . Drug use: No     Family Hx: The patient's family history includes Diabetes in her mother; Early death in her father; Hypertension in her father.  ROS:   Please see the history of present illness.    All other systems reviewed and are negative.   Prior CV studies:   The following studies were reviewed today: Echocardiogram Left ventricle: The cavity size was normal. Systolic  function was normal. The estimated ejection fraction was  in the range of 55% to 60%. Wall motion was normal; there  were no regional wall motion abnormalities. Left  ventricular diastolic function parameters were normal.   - Aortic valve:  Trileaflet; normal thickness leaflets.  Mobility was not restricted. Doppler: Transvalvular  velocity was within the normal range. There was no stenosis.  No regurgitation.  - Mitral valve: Mildly calcified annulus.  - Left atrium: The atrium was mildly dilated.  - Tricuspid valve: Mild regurgitation.   Labs/Other Tests and Data Reviewed:    EKG:  No ECG reviewed.  Recent Labs: 01/21/2019: ALT 17; BUN 14; Creatinine, Ser 0.77; Hemoglobin 13.5; Platelets 358; Potassium 3.4; Sodium 140   Recent Lipid Panel No results found for: CHOL, TRIG, HDL, CHOLHDL, LDLCALC, LDLDIRECT  Wt Readings from Last 3 Encounters:  05/28/19 165 lb (74.8 kg)  01/21/19 174 lb (78.9 kg)  06/07/18 169 lb (76.7 kg)     Objective:    Vital Signs:  Ht 5' (1.524 m)   Wt 165 lb (74.8 kg)   LMP 06/09/2017   BMI 32.22 kg/m    Limited due to telephone visit. She was at work and did not have access to BP Monitor   ASSESSMENT & PLAN:   1. Palpitations: She continues to have these symptoms occasionally. I have discussed decreasing caffeine in her diet. She is willing to try this to help with these occasional symptoms.   2. Hypertension:  BP is not available today. She is medically complaint and reports that her BP is controlled on current regimen  when she sees her PCP or OB-GYN. She requests refills on Cartia XT. She is given 90 day supply. Labs are completed by PCP  3. Aortic Valve murmur: Will have echocardiogram repeated. She is agreeable to this.   COVID-19 Education: The signs and symptoms of COVID-19 were discussed with the patient and how to seek care for testing (follow up with PCP or arrange E-visit).  The importance of social distancing was discussed today.  Time:   Today, I have spent 15 mHinutes with the patient with telehealth technology discussing the above problems.     Medication Adjustments/Labs and Tests Ordered: Current medicines are reviewed at length with the patient today.  Concerns regarding medicines are outlined above.   Tests Ordered: Orders Placed This Encounter  Procedures  . ECHOCARDIOGRAM COMPLETE    Medication Changes: Meds ordered this encounter  Medications  . diltiazem (CARTIA XT)  180 MG 24 hr capsule    Sig: Take 1 capsule (180 mg total) by mouth daily.    Dispense:  90 capsule    Refill:  3    Disposition:  Follow up 3 months   Signed, Phill Myron. West Pugh, ANP, AACC  05/28/2019 3:30 PM    [facility]

## 2019-05-28 ENCOUNTER — Encounter: Payer: Self-pay | Admitting: Adult Health

## 2019-05-28 ENCOUNTER — Telehealth (INDEPENDENT_AMBULATORY_CARE_PROVIDER_SITE_OTHER): Payer: BLUE CROSS/BLUE SHIELD | Admitting: Adult Health

## 2019-05-28 VITALS — Ht 60.0 in | Wt 165.0 lb

## 2019-05-28 DIAGNOSIS — I358 Other nonrheumatic aortic valve disorders: Secondary | ICD-10-CM

## 2019-05-28 DIAGNOSIS — R002 Palpitations: Secondary | ICD-10-CM

## 2019-05-28 DIAGNOSIS — I83813 Varicose veins of bilateral lower extremities with pain: Secondary | ICD-10-CM

## 2019-05-28 DIAGNOSIS — I359 Nonrheumatic aortic valve disorder, unspecified: Secondary | ICD-10-CM

## 2019-05-28 DIAGNOSIS — I10 Essential (primary) hypertension: Secondary | ICD-10-CM

## 2019-05-28 MED ORDER — DILTIAZEM HCL ER COATED BEADS 180 MG PO CP24: 180.0000 mg | ORAL_CAPSULE | Freq: Every day | ORAL | 3 refills | Status: DC

## 2019-05-28 NOTE — Patient Instructions (Signed)
Medication Instructions:  Continue current medications  *If you need a refill on your cardiac medications before your next appointment, please call your pharmacy*   Lab Work: None Ordered  Testing/Procedures: Your physician has requested that you have an echocardiogram. Echocardiography is a painless test that uses sound waves to create images of your heart. It provides your doctor with information about the size and shape of your heart and how well your heart's chambers and valves are working. This procedure takes approximately one hour. There are no restrictions for this procedure.  Follow-Up: At [facility], you and your health needs are our priority.  As part of our continuing mission to provide you with exceptional heart care, we have created designated Provider Care Teams.  These Care Teams include your primary Cardiologist (physician) and Advanced Practice Providers (APPs -  Physician Assistants and Nurse Practitioners) who all work together to provide you with the care you need, when you need it.  We recommend signing up for the patient portal called "MyChart".  Sign up information is provided on this After Visit Summary.  MyChart is used to connect with patients for Virtual Visits (Telemedicine).  Patients are able to view lab/test results, encounter notes, upcoming appointments, etc.  Non-urgent messages can be sent to your provider as well.   To learn more about what you can do with MyChart, go to [email or web address].    Your next appointment:   6 month(s)  The format for your next appointment:   In Person  Provider:   You may see Dr Glenetta Hew or one of the following Advanced Practice Providers on your designated Care Team:    Rosaria Ferries, PA-C  Jory Sims, DNP, ANP  Cadence Kathlen Mody, NP

## 2019-06-13 ENCOUNTER — Other Ambulatory Visit: Payer: Self-pay

## 2019-06-13 ENCOUNTER — Ambulatory Visit (HOSPITAL_COMMUNITY): Payer: BLUE CROSS/BLUE SHIELD | Attending: Cardiovascular Disease

## 2019-06-13 DIAGNOSIS — I359 Nonrheumatic aortic valve disorder, unspecified: Secondary | ICD-10-CM | POA: Insufficient documentation

## 2019-07-09 ENCOUNTER — Telehealth: Payer: Self-pay | Admitting: Physician Assistant

## 2019-07-09 NOTE — Telephone Encounter (Signed)
Patient called after hours with palpitation for past 2 weeks.  Feels like "double beat".  She does not have blood pressure cuff at home.  Denies chest pain, shortness of breath, dizziness or syncope.  Advised to take extra 180 mg of Cardizem.  She will call office in the morning for further direction and evaluation.

## 2019-07-10 ENCOUNTER — Telehealth: Payer: Self-pay | Admitting: Cardiology

## 2019-07-10 NOTE — Telephone Encounter (Signed)
Contacted patient- she states for the last 2 weeks every other day she has had palpitation feeling in her chest.  She did call in yesterday and spoke to on call provider and they advised her to take another 180 mg of the Cardizem, but patient advised with me on her med list that she is taking 240 mg already and felt that would be too much medication so she did not take it. She would like to be seen in office to be evaluated.  Patient has no other symptoms, no chest pain, shortness of breath, swelling, and only at times does she get lightheaded. Patient states she does not check her BP or HR currently- but she did just order a machine and it will be here soon.   Patient was advised if she began having sharpe chest pain, any shortness of breath, blurry vision or lightheadedness that did not go away to go to the ER to be evaluated.   Will route to NP who will be seeing this patient on Friday afternoon.

## 2019-07-10 NOTE — Telephone Encounter (Signed)
Patient c/o Palpitations:  High priority if patient c/o lightheadedness, shortness of breath, or chest pain  1) How long have you had palpitations/irregular HR/ Afib? Are you having the symptoms now? Has been going on for 2 weeks, is having symptoms right now.  2) Are you currently experiencing lightheadedness, SOB or CP? Lightheadedness   3) Do you have a history of afib (atrial fibrillation) or irregular heart rhythm? No   4) Have you checked your BP or HR? (document readings if available): No   5) Are you experiencing any other symptoms? No  [name] is calling stating she has been experiencing palpitations for the past two weeks. She states she is having palpitations now, but no other symptoms are present at this time. [name] does experience some lightheadedness with it occasionally, but has not experienced that today. She would like an appointment in regards to this ASAP. Please advise.

## 2019-07-11 NOTE — Progress Notes (Signed)
Cardiology Clinic Note   Patient Name: [name] Date of Encounter: 07/12/2019  Primary Care Provider:  Nolene Ebbs, MD Primary Cardiologist:  Glenetta Hew, MD  Patient Profile    [name] 44 year old female presents today for evaluation of her palpitations.  Past Medical History    Past Medical History:  Diagnosis Date  . Anemia   . Essential hypertension   . Fibroids 2012  . GERD (gastroesophageal reflux disease)    diet controlled, no meds  . Heart palpitations    Noted to have PVCs and no arrhythmia on cardiac monitor -not taking beta blocker;  ECHO December 2013: EF 55-60% with mild LA ablation. Otherwise normal., no current problems  . SVD (spontaneous vaginal delivery)    x 2  . Syphilis   . Trichomonal infection    Past Surgical History:  Procedure Laterality Date  . CHOLECYSTECTOMY    . EYE SURGERY Left    retinal detachment  . GALLBLADDER SURGERY    . HAND SURGERY    . HYSTERECTOMY ABDOMINAL WITH SALPINGECTOMY Bilateral 09/26/2017   Procedure: HYSTERECTOMY ABDOMINAL WITH BILATERAL SALPINGECTOMY;  Surgeon: Sloan Leiter, MD;  Location: [facility];  Service: Gynecology;  Laterality: Bilateral;  . VAGINAL HYSTERECTOMY Bilateral 09/26/2017   Procedure: ATTEMPETED HYSTERECTOMY VAGINAL WITH BILATERAL SALPINGECTOMY;  Surgeon: Sloan Leiter, MD;  Location: [facility];  Service: Gynecology;  Laterality: Bilateral;    Allergies  No Known Allergies  History of Present Illness    Ms. Morman has a PMH of essential hypertension, obesity, palpitations, hyperlipidemia, and aortic murmur.  At her office visit on 12/02/2018 she was found to have an aortic murmur and an echocardiogram was ordered.  Dr. Ellyn Hack recommended wearing lower extremity support stockings for varicose veins.  She was seen by Jory Sims, DNP on 05/28/2019 virtually for follow-up evaluation.  She continued to have palpitations at that time.  She had been drinking caffeinated  beverages.  She stated that her blood pressure has been okay each time she had seen her PCP for gynecologist.  She denied chest pain, dizziness, DOE, fatigue and malaise.  She was compliant with her medication at that time.  She indicated that her labs were completed every 6 months with her PCP.  She contacted nurse triage line on 07/10/2019.  During that time she stated that she had noticed palpitations every other day for 2 weeks.  She had spoken with the on-call provider on 07/09/2019 and was  instructed to take another dose of Cardizem.  She was concerned that medication dosing was different and that she would be taking too much medication.  She presents the clinic today for follow-up evaluation and states she has noticed intermittent episodes of palpitations.  They were more intense on 07/10/2019.  She states she had not done anything differently and wanted to be seen.  She has not had any further episodes of palpitations since that time.  She continues to be compliant with her diltiazem and her hydrochlorothiazide.  She has noticed that she has some varicose veins and has increased lower extremity swelling through the day but dissipates overnight.  I have recommended a low-sodium heart healthy diet, lower extremity support stockings, avoiding triggers for palpitations and reviewed her echocardiogram.  Ordered TSH and BMP.  I will have her follow-up with Dr. Ellyn Hack in 3 months.  Today she denies chest pain, shortness of breath, lower extremity edema, fatigue, palpitations, melena, hematuria, hemoptysis, diaphoresis, weakness, presyncope, syncope, orthopnea, and PND.  Home Medications    Prior to Admission medications   Medication Sig Start Date End Date Taking? Authorizing Provider  diltiazem (CARTIA XT) 180 MG 24 hr capsule Take 1 capsule (180 mg total) by mouth daily. 05/28/19   Lendon Colonel, NP  hydrochlorothiazide (HYDRODIURIL) 25 MG tablet TAKE 1 TABLET BY MOUTH ONCE DAILY 03/30/18    Leonie Man, MD  Potassium Chloride ER 20 MEQ TBCR Take 1 tablet by mouth daily. 01/12/19   [provider]  prenatal vitamin w/FE, FA (PRENATAL 1 + 1) 27-1 MG TABS tablet Take 1 tablet by mouth daily before breakfast. 01/21/19   Shelly Bombard, MD    Family History    Family History  Problem Relation Age of Onset  . Hypertension Father   . Early death Father   . Diabetes Mother    She indicated that her mother is alive. She indicated that her father is deceased. She indicated that her sister is alive. She indicated that her brother is alive.  Social History    Social History   Socioeconomic History  . Marital status: Single    Spouse name: Not on file  . Number of children: Not on file  . Years of education: Not on file  . Highest education level: Not on file  Occupational History  . Not on file  Tobacco Use  . Smoking status: Current Every Day Smoker    Packs/day: 0.50    Years: 10.00    Pack years: 5.00    Types: Cigarettes    Start date: 05/29/2003  . Smokeless tobacco: Never Used  . Tobacco comment: smoke about 4 a day  Substance and Sexual Activity  . Alcohol use: Yes    Alcohol/week: 2.0 standard drinks    Types: 2 Standard drinks or equivalent per week    Comment: Occasionaly   . Drug use: No  . Sexual activity: Yes    Partners: Male    Birth control/protection: Surgical  Other Topics Concern  . Not on file  Social History Narrative   Single. Still smokes maybe a quarter of a pack a day. Does not take alcohol or caffeine to   Social Determinants of Health   Financial Resource Strain:   . Difficulty of Paying Living Expenses:   Food Insecurity:   . Worried About Charity fundraiser in the Last Year:   . Arboriculturist in the Last Year:   Transportation Needs:   . Film/video editor (Medical):   Marland Kitchen Lack of Transportation (Non-Medical):   Physical Activity:   . Days of Exercise per Week:   . Minutes of Exercise per Session:     Stress:   . Feeling of Stress :   Social Connections:   . Frequency of Communication with Friends and Family:   . Frequency of Social Gatherings with Friends and Family:   . Attends Religious Services:   . Active Member of Clubs or Organizations:   . Attends Archivist Meetings:   Marland Kitchen Marital Status:   Intimate Partner Violence:   . Fear of Current or Ex-Partner:   . Emotionally Abused:   Marland Kitchen Physically Abused:   . Sexually Abused:      Review of Systems    General:  No chills, fever, night sweats or weight changes.  Cardiovascular:  No chest pain, dyspnea on exertion, edema, orthopnea, palpitations, paroxysmal nocturnal dyspnea. Dermatological: No rash, lesions/masses Respiratory: No cough, dyspnea Urologic: No hematuria, dysuria Abdominal:  No nausea, vomiting, diarrhea, bright red blood per rectum, melena, or hematemesis Neurologic:  No visual changes, wkns, changes in mental status. All other systems reviewed and are otherwise negative except as noted above.  Physical Exam    VS:  BP (!) 150/71   Pulse 87   Temp 98.1 F (36.7 C)   Ht 5' (1.524 m)   Wt 183 lb (83 kg)   LMP 06/09/2017   SpO2 96%   BMI 35.74 kg/m  , BMI Body mass index is 35.74 kg/m. GEN: Well nourished, well developed, in no acute distress. HEENT: normal. Neck: Supple, no JVD, carotid bruits, or masses. Cardiac: RRR, no murmurs, rubs, or gallops. No clubbing, cyanosis, edema.  Radials/DP/PT 2+ and equal bilaterally.  Respiratory:  Respirations regular and unlabored, clear to auscultation bilaterally. GI: Soft, nontender, nondistended, BS + x 4. MS: no deformity or atrophy. Skin: warm and dry, no rash. Neuro:  Strength and sensation are intact. Psych: Normal affect.  Accessory Clinical Findings    ECG personally reviewed by me today-EKG today shows normal sinus rhythm 87 bpm- No acute changes  EKG 12/21/2016 Normal sinus rhythm 81 bpm  Echocardiogram 06/13/2019  IMPRESSIONS     1. Left ventricular ejection fraction, by estimation, is 60 to 65%. Left  ventricular ejection fraction by PLAX is 62 %. The left ventricle has  normal function. The left ventricle has no regional wall motion  abnormalities. Left ventricular diastolic  parameters were normal. The average left ventricular global longitudinal  strain is -18.0 %.  2. Right ventricular systolic function is normal. The right ventricular  size is normal. There is normal pulmonary artery systolic pressure.  3. The mitral valve is normal in structure. No evidence of mitral valve  regurgitation. No evidence of mitral stenosis.  4. The aortic valve is tricuspid. Aortic valve regurgitation is not  visualized. No aortic stenosis is present.  5. The inferior vena cava is normal in size with greater than 50%  respiratory variability, suggesting right atrial pressure of 3 mmHg.  Assessment & Plan   1.  Palpitations-continues to have intermittent periods of flutter/palpitation.  Has not had any palpitations in the last 1-2 days.  Denies presyncope and syncope.  Again discuss decreasing caffeine.  Also discussed other triggers or palpitations. Continue diltiazem 180 mg daily Heart healthy low-sodium diet Increase physical activity as tolerated. Maintain p.o. hydration. Avoid triggers caffeine, chocolate, EtOH, stress, stimulants, etc.-Information given Order TSH and BMP.  Essential hypertension-BP today 150/71.  Well-controlled at home 120s over 80s. Continue HCTZ 25 mg daily Continue diltiazem 180 mg daily Heart healthy low-sodium diet-salty 6 given Increase physical activity as tolerated  Aortic valve murmur-repeat echocardiogra I will have a recent m showed LV function 60-65%, aortic valve was tricuspid,no valve regurgitation, No aortic stenosis present. Continue to monitor.  Hyperlipidemia-LDL goal less than 100 Heart healthy low-sodium high-fiber diet Increase physical activity as tolerated Repeat  lipid panel-direct LDL  Disposition: Follow-up with Dr. Ellyn Hack in 3 months.  Jossie Ng. [facility] [place] [address] Office [phone] Fax [phone]

## 2019-07-12 ENCOUNTER — Encounter: Payer: Self-pay | Admitting: Obstetrics

## 2019-07-12 ENCOUNTER — Ambulatory Visit (INDEPENDENT_AMBULATORY_CARE_PROVIDER_SITE_OTHER): Payer: BLUE CROSS/BLUE SHIELD | Admitting: Obstetrics

## 2019-07-12 ENCOUNTER — Other Ambulatory Visit (HOSPITAL_COMMUNITY): Admission: RE | Admit: 2019-07-12 | Payer: BLUE CROSS/BLUE SHIELD | Source: Ambulatory Visit | Admitting: Obstetrics

## 2019-07-12 ENCOUNTER — Encounter: Payer: Self-pay | Admitting: General Practice

## 2019-07-12 ENCOUNTER — Other Ambulatory Visit: Payer: Self-pay

## 2019-07-12 ENCOUNTER — Ambulatory Visit (INDEPENDENT_AMBULATORY_CARE_PROVIDER_SITE_OTHER): Payer: BLUE CROSS/BLUE SHIELD | Admitting: General Practice

## 2019-07-12 VITALS — BP 150/71 | HR 87 | Temp 98.1°F | Ht 60.0 in | Wt 183.0 lb

## 2019-07-12 VITALS — BP 144/97 | HR 83 | Ht 60.0 in | Wt 163.0 lb

## 2019-07-12 DIAGNOSIS — J301 Allergic rhinitis due to pollen: Secondary | ICD-10-CM

## 2019-07-12 DIAGNOSIS — N760 Acute vaginitis: Secondary | ICD-10-CM | POA: Diagnosis not present

## 2019-07-12 DIAGNOSIS — I10 Essential (primary) hypertension: Secondary | ICD-10-CM | POA: Diagnosis not present

## 2019-07-12 DIAGNOSIS — I358 Other nonrheumatic aortic valve disorders: Secondary | ICD-10-CM | POA: Diagnosis not present

## 2019-07-12 DIAGNOSIS — R002 Palpitations: Secondary | ICD-10-CM

## 2019-07-12 DIAGNOSIS — Z79899 Other long term (current) drug therapy: Secondary | ICD-10-CM

## 2019-07-12 DIAGNOSIS — Z01419 Encounter for gynecological examination (general) (routine) without abnormal findings: Secondary | ICD-10-CM | POA: Diagnosis not present

## 2019-07-12 DIAGNOSIS — N898 Other specified noninflammatory disorders of vagina: Secondary | ICD-10-CM

## 2019-07-12 DIAGNOSIS — E7800 Pure hypercholesterolemia, unspecified: Secondary | ICD-10-CM

## 2019-07-12 DIAGNOSIS — N750 Cyst of Bartholin's gland: Secondary | ICD-10-CM

## 2019-07-12 DIAGNOSIS — F17210 Nicotine dependence, cigarettes, uncomplicated: Secondary | ICD-10-CM

## 2019-07-12 DIAGNOSIS — Z90710 Acquired absence of both cervix and uterus: Secondary | ICD-10-CM

## 2019-07-12 DIAGNOSIS — B9689 Other specified bacterial agents as the cause of diseases classified elsewhere: Secondary | ICD-10-CM | POA: Diagnosis not present

## 2019-07-12 DIAGNOSIS — N6320 Unspecified lump in the left breast, unspecified quadrant: Secondary | ICD-10-CM | POA: Diagnosis not present

## 2019-07-12 MED ORDER — LORATADINE 10 MG PO TABS: 10.0000 mg | ORAL_TABLET | Freq: Every day | ORAL | 11 refills | Status: DC

## 2019-07-12 NOTE — Patient Instructions (Signed)
Medication Instructions:  The current medical regimen is effective;  continue present plan and medications as directed. Please refer to the Current Medication list given to you today. *If you need a refill on your cardiac medications before your next appointment, please call your pharmacy*  Lab Work: DIRECT LDL, TSH AND BMET TODAY If you have labs (blood work) drawn today and your tests are completely normal, you will receive your results only by:  [place] (if you have MyChart) OR A paper copy in the mail.  If you have any lab test that is abnormal or we need to change your treatment, we will call you to review the results. You may go to any [facility] that is convenient for you however, we do have a lab in our office that is able to assist you. You DO NOT need an appointment for our lab. The lab is open 8:00am and closes at 4:00pm. Lunch 12:45 - 1:45pm.  Special Instructions Please try to avoid these triggers:  Do not use any products that have nicotine or tobacco in them. These include cigarettes, e-cigarettes, and chewing tobacco. If you need help quitting, ask your doctor.  Eat heart-healthy foods. Talk with your doctor about the right eating plan for you.  Exercise regularly as told by your doctor.  Do not drink alcohol, Caffeine or chocolate.  Lose weight if you are overweight.  Do not use drugs, including cannabis   PLEASE READ AND FOLLOW SALTY 6-ATTACHED  PLEASE READ AND FOLLOW MINDFULNESS TIPS ATTACHED  PLEASE PURCHASE AND WEAR COMPRESSION STOCKINGS DAILY AND OFF AT BEDTIME. Compression stockings are elastic socks that squeeze the legs. They help to increase blood flow to the legs and to decrease swelling in the legs from fluid retention, and reduce the chance of developing blood clots in the lower legs. Please put on in the AM when dressing and off at night when dressing for bed. PLEASE MAKE SURE TO ELEVATE YOU LEGS WHILE SITTING, THIS WILL HELP WITH THE SWELLING  ALSO.  TAKE AND LOG YOU BLOOD PRESSURE   Follow-Up: Your next appointment:  3 month(s) Please call our office 2 months in advance to schedule this appointment In Person with Glenetta Hew, MD  At [facility], you and your health needs are our priority.  As part of our continuing mission to provide you with exceptional heart care, we have created designated Provider Care Teams.  These Care Teams include your primary Cardiologist (physician) and Advanced Practice Providers (APPs -  Physician Assistants and Nurse Practitioners) who all work together to provide you with the care you need, when you need it.       Mindfulness-Based Stress Reduction Mindfulness-based stress reduction (MBSR) is a program that helps people learn to practice mindfulness. Mindfulness is the practice of intentionally paying attention to the present moment. It can be learned and practiced through techniques such as education, breathing exercises, meditation, and yoga. MBSR includes several mindfulness techniques in one program. MBSR works best when you understand the treatment, are willing to try new things, and can commit to spending time practicing what you learn. MBSR training may include learning about:  How your emotions, thoughts, and reactions affect your body.  New ways to respond to things that cause negative thoughts to start (triggers).  How to notice your thoughts and let go of them.  Practicing awareness of everyday things that you normally do without thinking.  The techniques and goals of different types of meditation. What are the benefits of  MBSR? MBSR can have many benefits, which include helping you to:  Develop self-awareness. This refers to knowing and understanding yourself.  Learn skills and attitudes that help you to participate in your own health care.  Learn new ways to care for yourself.  Be more accepting about how things are, and let things go.  Be less judgmental and approach  things with an open mind.  Be patient with yourself and trust yourself more. MBSR has also been shown to:  Reduce negative emotions, such as depression and anxiety.  Improve memory and focus.  Change how you sense and approach pain.  Boost your body's ability to fight infections.  Help you connect better with other people.  Improve your sense of well-being. Follow these instructions at home:   Find a local in-person or online MBSR program.  Set aside some time regularly for mindfulness practice.  Find a mindfulness practice that works best for you. This may include one or more of the following: ? Meditation. Meditation involves focusing your mind on a certain thought or activity. ? Breathing awareness exercises. These help you to stay present by focusing on your breath. ? Body scan. For this practice, you lie down and pay attention to each part of your body from head to toe. You can identify tension and soreness and intentionally relax parts of your body. ? Yoga. Yoga involves stretching and breathing, and it can improve your ability to move and be flexible. It can also provide an experience of testing your body's limits, which can help you release stress. ? Mindful eating. This way of eating involves focusing on the taste, texture, color, and smell of each bite of food. Because this slows down eating and helps you feel full sooner, it can be an important part of a weight-loss plan.  Find a podcast or recording that provides guidance for breathing awareness, body scan, or meditation exercises. You can listen to these any time when you have a free moment to rest without distractions.  Follow your treatment plan as told by your health care provider. This may include taking regular medicines and making changes to your diet or lifestyle as recommended. How to practice mindfulness To do a basic awareness exercise:  Find a comfortable place to sit.  Pay attention to the present  moment. Observe your thoughts, feelings, and surroundings just as they are.  Avoid placing judgment on yourself, your feelings, or your surroundings. Make note of any judgment that comes up, and let it go.  Your mind may wander, and that is okay. Make note of when your thoughts drift, and return your attention to the present moment. To do basic mindfulness meditation:  Find a comfortable place to sit. This may include a stable chair or a firm floor cushion. ? Sit upright with your back straight. Let your arms fall next to your side with your hands resting on your legs. ? If sitting in a chair, rest your feet flat on the floor. ? If sitting on a cushion, cross your legs in front of you.  Keep your head in a neutral position with your chin dropped slightly. Relax your jaw and rest the tip of your tongue on the roof of your mouth. Drop your gaze to the floor. You can close your eyes if you like.  Breathe normally and pay attention to your breath. Feel the air moving in and out of your nose. Feel your belly expanding and relaxing with each breath.  Your mind may  wander, and that is okay. Make note of when your thoughts drift, and return your attention to your breath.  Avoid placing judgment on yourself, your feelings, or your surroundings. Make note of any judgment or feelings that come up, let them go, and bring your attention back to your breath.  When you are ready, lift your gaze or open your eyes. Pay attention to how your body feels after the meditation. Where to find more information You can find more information about MBSR from:  Your health care provider.  Community-based meditation centers or programs.  Programs offered near you. Summary  Mindfulness-based stress reduction (MBSR) is a program that teaches you how to intentionally pay attention to the present moment. It is used with other treatments to help you cope better with daily stress, emotions, and pain.  MBSR focuses on  developing self-awareness, which allows you to respond to life stress without judgment or negative emotions.  MBSR programs may involve learning different mindfulness practices, such as breathing exercises, meditation, yoga, body scan, or mindful eating. Find a mindfulness practice that works best for you, and set aside time for it on a regular basis. This information is not intended to replace advice given to you by your health care provider. Make sure you discuss any questions you have with your health care provider.

## 2019-07-12 NOTE — Progress Notes (Signed)
Subjective:        [name] is a 44 y.o. female here for a routine exam.  Current complaints: Lump in left breast, non tender.  Vaginal cyst.  Personal health questionnaire:  Is patient Ashkenazi Jewish, have a family history of breast and/or ovarian cancer: no Is there a family history of uterine cancer diagnosed at age < 66, gastrointestinal cancer, urinary tract cancer, family member who is a Field seismologist syndrome-associated carrier: no Is the patient overweight and hypertensive, family history of diabetes, personal history of gestational diabetes, preeclampsia or PCOS: no Is patient over 3, have PCOS,  family history of premature CHD under age 77, diabetes, smoke, have hypertension or peripheral artery disease:  no At any time, has a partner hit, kicked or otherwise hurt or frightened you?: no Over the past 2 weeks, have you felt down, depressed or hopeless?: no Over the past 2 weeks, have you felt little interest or pleasure in doing things?:no   Gynecologic History Patient's last menstrual period was 06/09/2017. Contraception: status post hysterectomy Last Pap: 05-04-2017. Results were: normal Last mammogram: 2020. Results were: normal  Obstetric History OB History  Gravida Para Term Preterm AB Living  2 2 2     2   SAB TAB Ectopic Multiple Live Births          2    # Outcome Date GA Lbr Len/2nd Weight Sex Delivery Anes PTL Lv  2 Term 08/24/04 [redacted]w[redacted]d  7 lb 12 oz (3.515 kg) F Vag-Spont EPI  LIV  1 Term 03/30/96 [redacted]w[redacted]d  6 lb 15 oz (3.147 kg) M Vag-Spont None  LIV    Past Medical History:  Diagnosis Date  . Anemia   . Essential hypertension   . Fibroids 2012  . GERD (gastroesophageal reflux disease)    diet controlled, no meds  . Heart palpitations    Noted to have PVCs and no arrhythmia on cardiac monitor -not taking beta blocker;  ECHO December 2013: EF 55-60% with mild LA ablation. Otherwise normal., no current problems  . SVD (spontaneous vaginal delivery)     x 2  . Syphilis   . Trichomonal infection     Past Surgical History:  Procedure Laterality Date  . CHOLECYSTECTOMY    . EYE SURGERY Left    retinal detachment  . GALLBLADDER SURGERY    . HAND SURGERY    . HYSTERECTOMY ABDOMINAL WITH SALPINGECTOMY Bilateral 09/26/2017   Procedure: HYSTERECTOMY ABDOMINAL WITH BILATERAL SALPINGECTOMY;  Surgeon: Sloan Leiter, MD;  Location: [facility];  Service: Gynecology;  Laterality: Bilateral;  . VAGINAL HYSTERECTOMY Bilateral 09/26/2017   Procedure: ATTEMPETED HYSTERECTOMY VAGINAL WITH BILATERAL SALPINGECTOMY;  Surgeon: Sloan Leiter, MD;  Location: [facility];  Service: Gynecology;  Laterality: Bilateral;     Current Outpatient Medications:  .  diltiazem (CARTIA XT) 180 MG 24 hr capsule, Take 1 capsule (180 mg total) by mouth daily., Disp: 90 capsule, Rfl: 3 .  hydrochlorothiazide (HYDRODIURIL) 25 MG tablet, TAKE 1 TABLET BY MOUTH ONCE DAILY, Disp: 90 tablet, Rfl: 0 .  loratadine (CLARITIN) 10 MG tablet, Take 1 tablet (10 mg total) by mouth daily., Disp: 30 tablet, Rfl: 11 .  Potassium Chloride ER 20 MEQ TBCR, Take 1 tablet by mouth daily., Disp: , Rfl:  .  prenatal vitamin w/FE, FA (PRENATAL 1 + 1) 27-1 MG TABS tablet, Take 1 tablet by mouth daily before breakfast., Disp: 30 tablet, Rfl: 11 No Known Allergies  Social History   Tobacco Use  .  Smoking status: Current Every Day Smoker    Packs/day: 0.50    Years: 10.00    Pack years: 5.00    Types: Cigarettes    Start date: 05/29/2003  . Smokeless tobacco: Never Used  . Tobacco comment: smoke about 4 a day  Substance Use Topics  . Alcohol use: Yes    Alcohol/week: 2.0 standard drinks    Types: 2 Standard drinks or equivalent per week    Comment: Occasionaly     Family History  Problem Relation Age of Onset  . Hypertension Father   . Early death Father   . Diabetes Mother       Review of Systems  Constitutional: negative for fatigue and weight loss Respiratory: negative for cough and  wheezing Cardiovascular: negative for chest pain, fatigue and palpitations Gastrointestinal: negative for abdominal pain and change in bowel habits Musculoskeletal:negative for myalgias Neurological: negative for gait problems and tremors Behavioral/Psych: negative for abusive relationship, depression Endocrine: negative for temperature intolerance    Genitourinary:negative for abnormal menstrual periods, genital lesions, hot flashes, sexual problems and vaginal discharge Integument/breast: positive for breast lump in left breast.  Negative for breast tenderness, nipple discharge and skin lesion(s)    Objective:       BP (!) 144/97   Pulse 83   Ht 5' (1.524 m)   Wt 163 lb (73.9 kg)   LMP 06/09/2017   BMI 31.83 kg/m  General:   alert and no distress  Skin:   no rash or abnormalities  Lungs:   clear to auscultation bilaterally  Heart:   regular rate and rhythm, S1, S2 normal, no murmur, click, rub or gallop  Breasts:    Mass felt left side at 9 o' clock, soft, mobile, NT  Abdomen:  normal findings: no organomegaly, soft, non-tender and no hernia  Pelvis:  External genitalia: normal general appearance Urinary system: urethral meatus normal and bladder without fullness, nontender Vaginal: normal without tenderness, induration or masses Cervix: absent Adnexa: normal bimanual exam Uterus: absent   Lab Review Urine pregnancy test Labs reviewed yes Radiologic studies reviewed yes  50% of 25 min visit spent on counseling and coordination of care.   Assessment:     1. Encounter for gynecological examination  2. S/P TAH (total abdominal hysterectomy)  3. Breast mass, left Rx: - US BREAST LTD UNI LEFT INC AXILLA; Future - MM Digital Diagnostic Unilat L; Future  4. Vaginal discharge Rx: - Cervicovaginal ancillary only( [place])  5. Bartholin's cyst - requests surgical drainage or removal  6. Seasonal allergic rhinitis due to pollen Rx: - loratadine (CLARITIN) 10 MG  tablet; Take 1 tablet (10 mg total) by mouth daily.  Dispense: 30 tablet; Refill: 11   7. Tobacco dependence - cessation with the aid of medication and behavioral modification recommended   Plan:    Education reviewed: calcium supplements, depression evaluation, low fat, low cholesterol diet, safe sex/STD prevention, self breast exams and weight bearing exercise. Mammogram ordered. Follow up in: 1 year. for Annual.  Meds ordered this encounter  Medications  . loratadine (CLARITIN) 10 MG tablet    Sig: Take 1 tablet (10 mg total) by mouth daily.    Dispense:  30 tablet    Refill:  11   Orders Placed This Encounter  Procedures  . US BREAST LTD UNI LEFT INC AXILLA    Standing Status:   Future    Standing Expiration Date:   09/10/2020    Order Specific Question:  Reason for Exam (SYMPTOM  OR DIAGNOSIS REQUIRED)    Answer:   Left breast mass    Order Specific Question:   Preferred imaging location?    Answer:   Internal  . [facility] Digital Diagnostic Unilat L    Standing Status:   Future    Standing Expiration Date:   09/10/2020    Order Specific Question:   Reason for Exam (SYMPTOM  OR DIAGNOSIS REQUIRED)    Answer:   Breast mass    Order Specific Question:   Is the patient pregnant?    Answer:   No    Order Specific Question:   Preferred imaging location?    Answer:   External    Shelly Bombard, MD 07/12/2019 11:55 AM

## 2019-07-12 NOTE — Progress Notes (Signed)
Pt needs mammo at [facility] Pt states has cyst in her vaginal area, ?drain.

## 2019-07-13 LAB — BASIC METABOLIC PANEL
BUN/Creatinine Ratio: 17 (ref 9–23)
BUN: 12 mg/dL (ref 6–24)
CO2: 24 mmol/L (ref 20–29)
Calcium: 10.1 mg/dL (ref 8.7–10.2)
Chloride: 102 mmol/L (ref 96–106)
Creatinine, Ser: 0.69 mg/dL (ref 0.57–1.00)
GFR calc Af Amer: 122 mL/min/{1.73_m2} (ref >59)
GFR calc non Af Amer: 105 mL/min/{1.73_m2} (ref >59)
Glucose: 88 mg/dL (ref 65–99)
Potassium: 3.4 mmol/L — ABNORMAL LOW (ref 3.5–5.2)
Sodium: 141 mmol/L (ref 134–144)

## 2019-07-13 LAB — LDL CHOLESTEROL, DIRECT: LDL Direct: 150 mg/dL — ABNORMAL HIGH (ref 0–99)

## 2019-07-13 LAB — TSH: TSH: 1.29 u[IU]/mL (ref 0.450–4.500)

## 2019-07-15 ENCOUNTER — Other Ambulatory Visit: Payer: Self-pay | Admitting: Obstetrics

## 2019-07-15 DIAGNOSIS — B9689 Other specified bacterial agents as the cause of diseases classified elsewhere: Secondary | ICD-10-CM

## 2019-07-15 LAB — CERVICOVAGINAL ANCILLARY ONLY
Bacterial Vaginitis (gardnerella): POSITIVE — AB
Candida Glabrata: NEGATIVE
Candida Vaginitis: NEGATIVE
Chlamydia: NEGATIVE
Comment: NEGATIVE
Comment: NEGATIVE
Comment: NEGATIVE
Comment: NEGATIVE
Comment: NEGATIVE
Comment: NORMAL
Neisseria Gonorrhea: NEGATIVE
Trichomonas: NEGATIVE

## 2019-07-15 MED ORDER — TINIDAZOLE 500 MG PO TABS: 1000.0000 mg | ORAL_TABLET | Freq: Every day | ORAL | 2 refills | Status: DC

## 2019-07-25 ENCOUNTER — Ambulatory Visit: Payer: BLUE CROSS/BLUE SHIELD | Admitting: Obstetrics and Gynecology

## 2019-07-25 ENCOUNTER — Other Ambulatory Visit: Payer: Self-pay

## 2019-07-25 ENCOUNTER — Encounter: Payer: Self-pay | Admitting: Obstetrics and Gynecology

## 2019-07-25 DIAGNOSIS — Z90710 Acquired absence of both cervix and uterus: Secondary | ICD-10-CM | POA: Insufficient documentation

## 2019-07-25 DIAGNOSIS — N907 Vulvar cyst: Secondary | ICD-10-CM

## 2019-07-25 MED ORDER — DOXYCYCLINE HYCLATE 100 MG PO CAPS: 100.0000 mg | ORAL_CAPSULE | Freq: Two times a day (BID) | ORAL | 0 refills | Status: DC

## 2019-07-25 NOTE — Progress Notes (Signed)
Pt requests drainage of Barth cyst Pap UTD 05/04/2017 Mammogram scheduled 08/09/2019

## 2019-07-25 NOTE — Progress Notes (Signed)
Patient ID: [name], female   DOB: 1975/09/03, 44 y.o.   MRN: [id number] [name] presents in referral from Dr. Johnney Ou for eval of ? Bartholin cyst. Pt reports having cyst for 2 yrs She denies pain or drainage Sexual active without problems  PE AF VSS Lungs clear Heart RRR Abd soft + BS GU 2 left labial cysts noted  Treatment of in office aspiration vs out pt surgerical excision discussed with pt. Pt elected to proceed with in office aspiration today in the office. Pt was made aware that the cyst could return in the furture  Procedure:  Informed consent was obtained. Left labial was cleaned with Betadine. 1.5 cc of plain Lidocaine was injected into each cyst Each cyst was the needled aspirated with an 18 gauge needle. Yellow purulent discharge was aspirated. @ 10 cc total. Drainage was cultured Cysts were resolved after aspiration Pt tolerated procedure well. Post procedure care instructions reviewed with pt. Sitz baths and reframe from IC  A/P Left Labial Cysts s/p needle aspiration x 2  Care as noted above. Doxycycline 100 mg po bid x 10 days F/U in 2 weeks.

## 2019-07-28 LAB — WOUND CULTURE: Organism ID, Bacteria: NONE SEEN

## 2019-08-13 ENCOUNTER — Ambulatory Visit: Payer: BLUE CROSS/BLUE SHIELD | Admitting: Obstetrics and Gynecology

## 2019-08-13 ENCOUNTER — Encounter: Payer: Self-pay | Admitting: Obstetrics and Gynecology

## 2019-08-13 ENCOUNTER — Other Ambulatory Visit: Payer: Self-pay

## 2019-08-13 VITALS — BP 148/90 | HR 69 | Wt 167.7 lb

## 2019-08-13 DIAGNOSIS — N907 Vulvar cyst: Secondary | ICD-10-CM

## 2019-08-13 NOTE — Progress Notes (Signed)
Pt is here for follow up visit after drainage of bartholin cyst. Pt reports she has no concerns or complaints at this time.

## 2019-08-13 NOTE — Progress Notes (Signed)
Patient ID: [name], female   DOB: Oct 08, 1975, 44 y.o.   MRN: [id number] Ms Ramon is here for f/u of needle aspiration of left labial cyst 2 weeks ago. She has no complaints today. Did not take antibiotics.Wound culture was negative   PE AF VSS Lungs clear Heart RRR Abd soft + BS GU small < 1 cm labial cyst, non tender  A/P labial cyst Will follow for now. Pt to return if increases in size or becomes painful. F/U PRN

## 2019-08-13 NOTE — Patient Instructions (Signed)
Health Maintenance, Female Adopting a healthy lifestyle and getting preventive care are important in promoting health and wellness. Ask your health care provider about:  The right schedule for you to have regular tests and exams.  Things you can do on your own to prevent diseases and keep yourself healthy. What should I know about diet, weight, and exercise? Eat a healthy diet   Eat a diet that includes plenty of vegetables, fruits, low-fat dairy products, and lean protein.  Do not eat a lot of foods that are high in solid fats, added sugars, or sodium. Maintain a healthy weight Body mass index (BMI) is used to identify weight problems. It estimates body fat based on height and weight. Your health care provider can help determine your BMI and help you achieve or maintain a healthy weight. Get regular exercise Get regular exercise. This is one of the most important things you can do for your health. Most adults should:  Exercise for at least 150 minutes each week. The exercise should increase your heart rate and make you sweat (moderate-intensity exercise).  Do strengthening exercises at least twice a week. This is in addition to the moderate-intensity exercise.  Spend less time sitting. Even light physical activity can be beneficial. Watch cholesterol and blood lipids Have your blood tested for lipids and cholesterol at 46 years of age, then have this test every 5 years. Have your cholesterol levels checked more often if:  Your lipid or cholesterol levels are high.  You are older than 46 years of age.  You are at high risk for heart disease. What should I know about cancer screening? Depending on your health history and family history, you may need to have cancer screening at various ages. This may include screening for:  Breast cancer.  Cervical cancer.  Colorectal cancer.  Skin cancer.  Lung cancer. What should I know about heart disease, diabetes, and high blood  pressure? Blood pressure and heart disease  High blood pressure causes heart disease and increases the risk of stroke. This is more likely to develop in people who have high blood pressure readings, are of African descent, or are overweight.  Have your blood pressure checked: ? Every 3-5 years if you are 18-39 years of age. ? Every year if you are 40 years old or older. Diabetes Have regular diabetes screenings. This checks your fasting blood sugar level. Have the screening done:  Once every three years after age 40 if you are at a normal weight and have a low risk for diabetes.  More often and at a younger age if you are overweight or have a high risk for diabetes. What should I know about preventing infection? Hepatitis B If you have a higher risk for hepatitis B, you should be screened for this virus. Talk with your health care provider to find out if you are at risk for hepatitis B infection. Hepatitis C Testing is recommended for:  Everyone born from 1945 through 1965.  Anyone with known risk factors for hepatitis C. Sexually transmitted infections (STIs)  Get screened for STIs, including gonorrhea and chlamydia, if: ? You are sexually active and are younger than 46 years of age. ? You are older than 46 years of age and your health care provider tells you that you are at risk for this type of infection. ? Your sexual activity has changed since you were last screened, and you are at increased risk for chlamydia or gonorrhea. Ask your health care provider if   you are at risk.  Ask your health care provider about whether you are at high risk for HIV. Your health care provider may recommend a prescription medicine to help prevent HIV infection. If you choose to take medicine to prevent HIV, you should first get tested for HIV. You should then be tested every 3 months for as long as you are taking the medicine. Pregnancy  If you are about to stop having your period (premenopausal) and  you may become pregnant, seek counseling before you get pregnant.  Take 400 to 800 micrograms (mcg) of folic acid every day if you become pregnant.  Ask for birth control (contraception) if you want to prevent pregnancy. Osteoporosis and menopause Osteoporosis is a disease in which the bones lose minerals and strength with aging. This can result in bone fractures. If you are 65 years old or older, or if you are at risk for osteoporosis and fractures, ask your health care provider if you should:  Be screened for bone loss.  Take a calcium or vitamin D supplement to lower your risk of fractures.  Be given hormone replacement therapy (HRT) to treat symptoms of menopause. Follow these instructions at home: Lifestyle  Do not use any products that contain nicotine or tobacco, such as cigarettes, e-cigarettes, and chewing tobacco. If you need help quitting, ask your health care provider.  Do not use street drugs.  Do not share needles.  Ask your health care provider for help if you need support or information about quitting drugs. Alcohol use  Do not drink alcohol if: ? Your health care provider tells you not to drink. ? You are pregnant, may be pregnant, or are planning to become pregnant.  If you drink alcohol: ? Limit how much you use to 0-1 drink a day. ? Limit intake if you are breastfeeding.  Be aware of how much alcohol is in your drink. In the [place] one drink equals one 12 oz bottle of beer (355 mL), one 5 oz glass of wine (148 mL), or one 1 oz glass of hard liquor (44 mL). General instructions  Schedule regular health, dental, and eye exams.  Stay current with your vaccines.  Tell your health care provider if: ? You often feel depressed. ? You have ever been abused or do not feel safe at home. Summary  Adopting a healthy lifestyle and getting preventive care are important in promoting health and wellness.  Follow your health care provider's instructions about healthy  diet, exercising, and getting tested or screened for diseases.  Follow your health care provider's instructions on monitoring your cholesterol and blood pressure. This information is not intended to replace advice given to you by your health care provider. Make sure you discuss any questions you have with your health care provider. Document Revised: 03/07/2018 Document Reviewed: 03/07/2018 Elsevier Patient Education  2020 [employer]  

## 2019-09-24 ENCOUNTER — Ambulatory Visit (INDEPENDENT_AMBULATORY_CARE_PROVIDER_SITE_OTHER): Payer: BLUE CROSS/BLUE SHIELD | Admitting: Pediatrics

## 2019-09-24 ENCOUNTER — Other Ambulatory Visit: Payer: Self-pay

## 2019-09-24 DIAGNOSIS — Z23 Encounter for immunization: Secondary | ICD-10-CM | POA: Diagnosis not present

## 2019-10-16 ENCOUNTER — Ambulatory Visit: Payer: BLUE CROSS/BLUE SHIELD

## 2020-01-22 IMAGING — CR DG SHOULDER 2+V*L*
3 series · 3 of 3 positions shown · non-contrast
Comparison: None.

CLINICAL DATA: Acute onset of left posterior shoulder pain and
scapular pain.

EXAM:
LEFT SHOULDER - 2+ VIEW

[shoulder grashey]
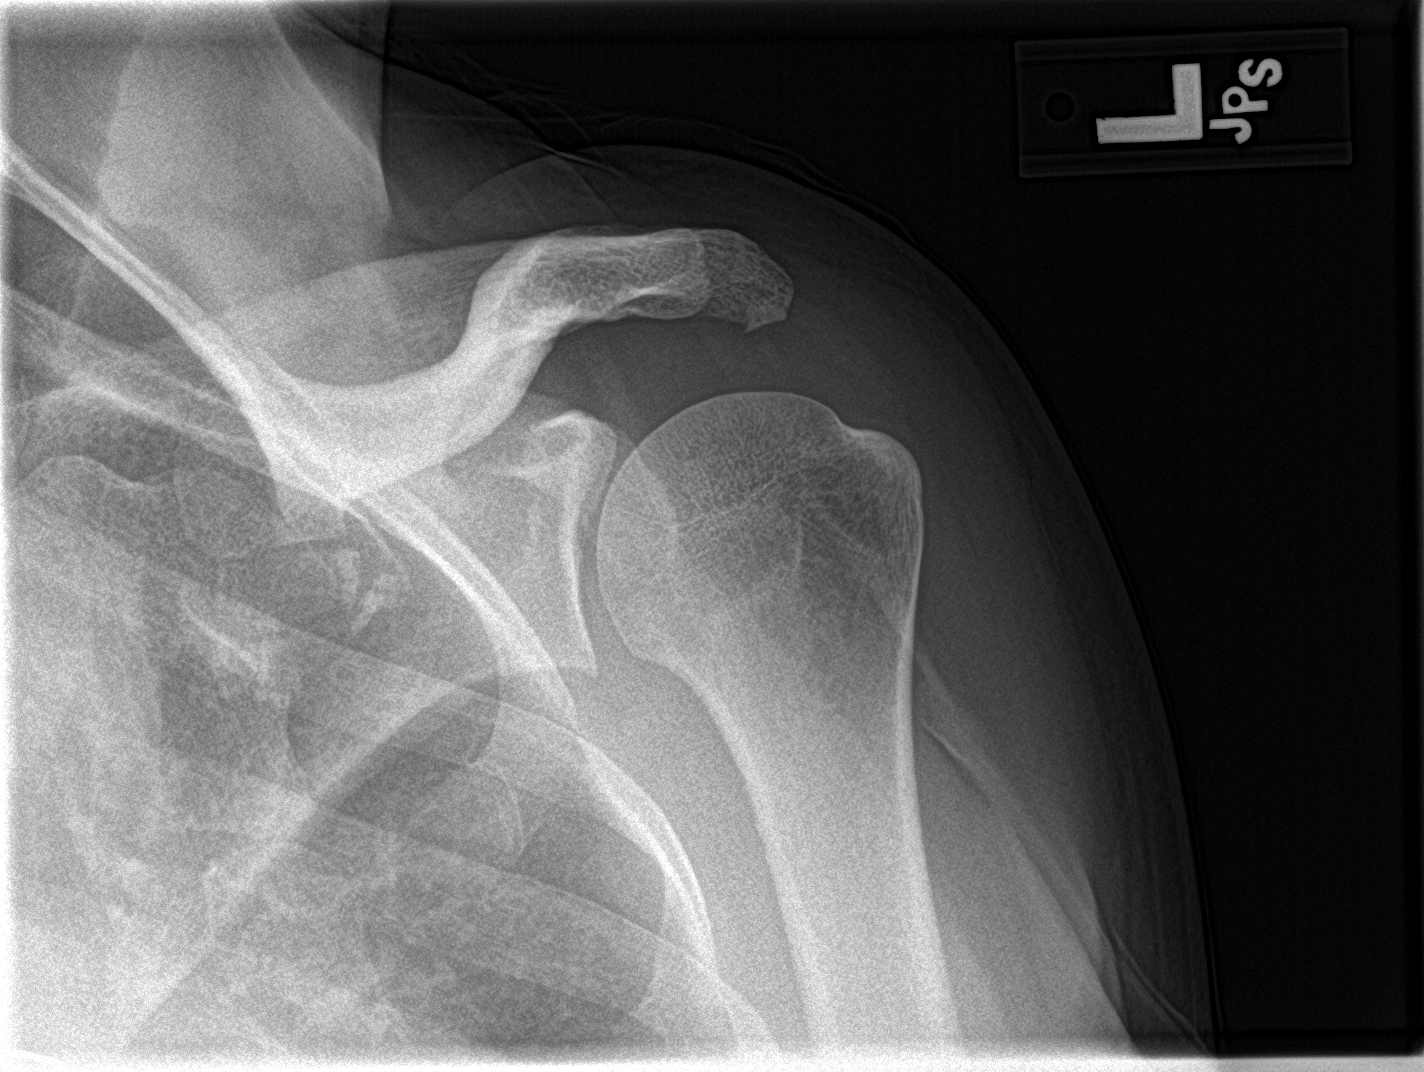

[shoulder y view]
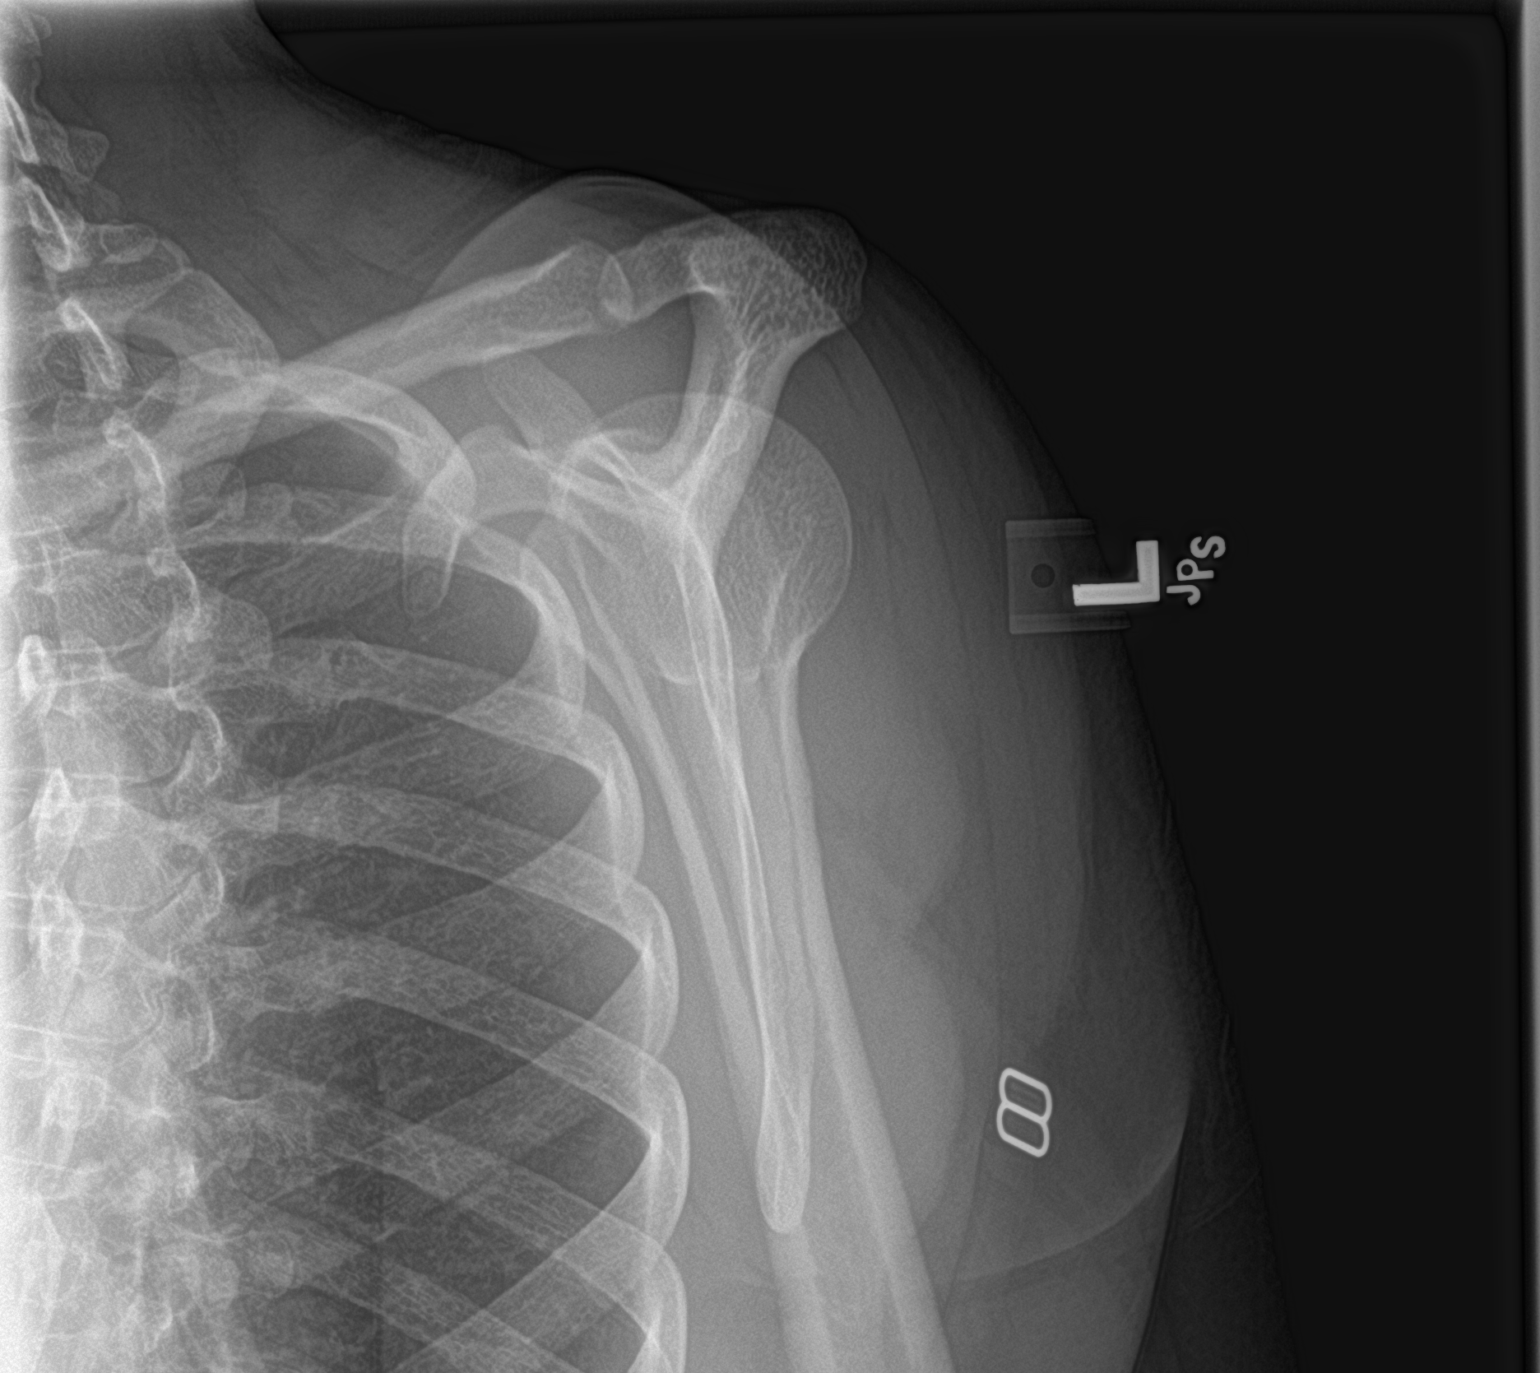

[shoulder axillary]
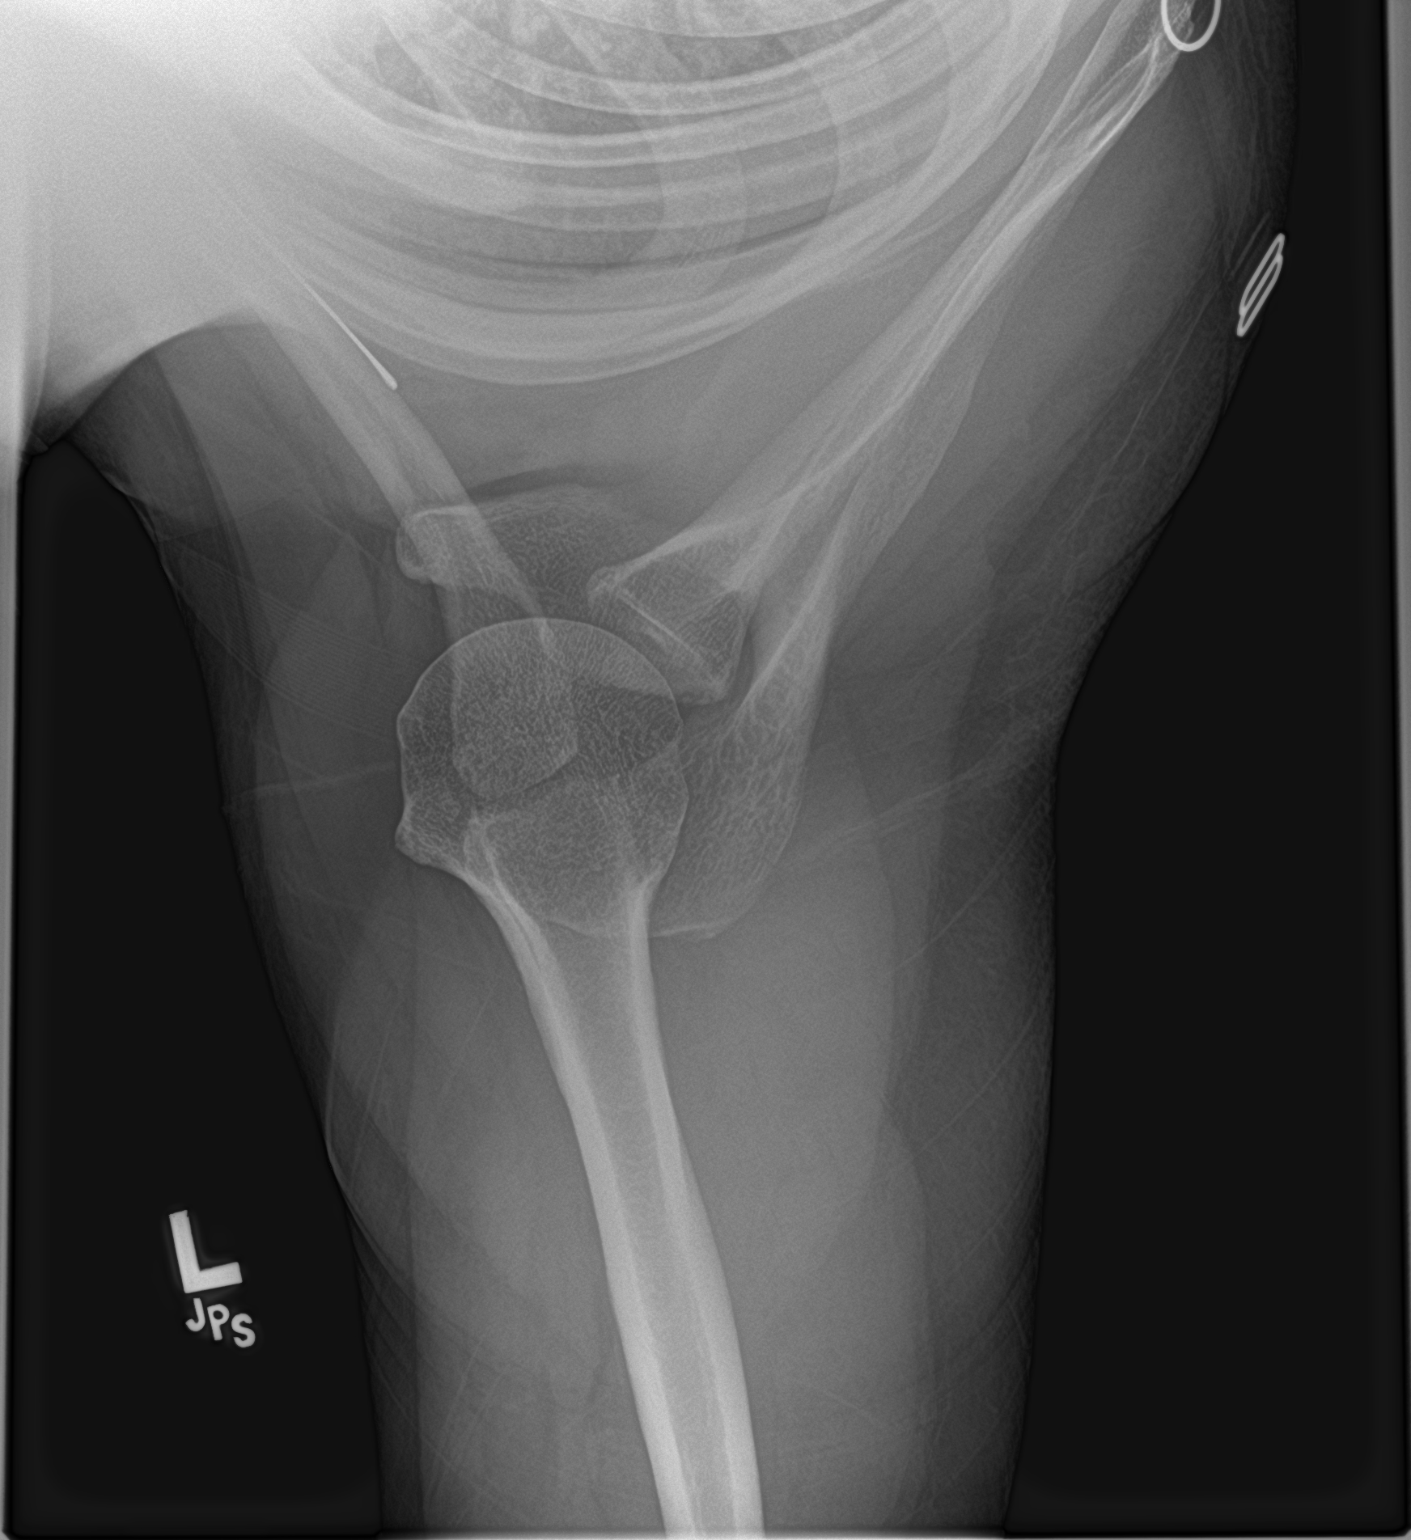

[3 of 3 positions shown; findings below may reference images not displayed]

FINDINGS: There is no evidence of fracture or dislocation. The left humeral
head is seated within the glenoid fossa. The acromioclavicular joint
is unremarkable in appearance. No significant soft tissue
abnormalities are seen. The visualized portions of the left lung are
clear.
IMPRESSION: No evidence of fracture or dislocation.

## 2020-06-30 ENCOUNTER — Other Ambulatory Visit: Payer: Self-pay | Admitting: Adult Health

## 2020-07-20 ENCOUNTER — Other Ambulatory Visit: Payer: Self-pay

## 2020-07-20 ENCOUNTER — Inpatient Hospital Stay (HOSPITAL_COMMUNITY): Admit: 2020-07-20 | Payer: BLUE CROSS/BLUE SHIELD

## 2020-07-20 ENCOUNTER — Encounter: Payer: Self-pay | Admitting: Obstetrics

## 2020-07-20 ENCOUNTER — Ambulatory Visit (INDEPENDENT_AMBULATORY_CARE_PROVIDER_SITE_OTHER): Payer: BLUE CROSS/BLUE SHIELD | Admitting: Obstetrics

## 2020-07-20 VITALS — BP 166/99 | HR 87 | Wt 167.0 lb

## 2020-07-20 DIAGNOSIS — N898 Other specified noninflammatory disorders of vagina: Secondary | ICD-10-CM

## 2020-07-20 DIAGNOSIS — Z789 Other specified health status: Secondary | ICD-10-CM

## 2020-07-20 DIAGNOSIS — B373 Candidiasis of vulva and vagina: Secondary | ICD-10-CM

## 2020-07-20 DIAGNOSIS — B3731 Acute candidiasis of vulva and vagina: Secondary | ICD-10-CM

## 2020-07-20 DIAGNOSIS — N760 Acute vaginitis: Secondary | ICD-10-CM | POA: Diagnosis not present

## 2020-07-20 DIAGNOSIS — Z1239 Encounter for other screening for malignant neoplasm of breast: Secondary | ICD-10-CM

## 2020-07-20 DIAGNOSIS — F17200 Nicotine dependence, unspecified, uncomplicated: Secondary | ICD-10-CM

## 2020-07-20 DIAGNOSIS — Z90710 Acquired absence of both cervix and uterus: Secondary | ICD-10-CM

## 2020-07-20 DIAGNOSIS — Z01419 Encounter for gynecological examination (general) (routine) without abnormal findings: Secondary | ICD-10-CM | POA: Diagnosis not present

## 2020-07-20 DIAGNOSIS — B9689 Other specified bacterial agents as the cause of diseases classified elsewhere: Secondary | ICD-10-CM

## 2020-07-20 DIAGNOSIS — Z1211 Encounter for screening for malignant neoplasm of colon: Secondary | ICD-10-CM

## 2020-07-20 DIAGNOSIS — I10 Essential (primary) hypertension: Secondary | ICD-10-CM

## 2020-07-20 MED ORDER — TINIDAZOLE 500 MG PO TABS: 1000.0000 mg | ORAL_TABLET | Freq: Every day | ORAL | 2 refills | Status: DC

## 2020-07-20 MED ORDER — FLUCONAZOLE 150 MG PO TABS: 150.0000 mg | ORAL_TABLET | Freq: Once | ORAL | 0 refills | Status: AC

## 2020-07-20 NOTE — Progress Notes (Signed)
Subjective:        [name] is a 45 y.o. female here for a routine exam.  Current complaints:  Vaginal discharge with irritation and odor.  Recurrent left Bartholin Cyst.  Patient desires more definitive surgical management.  PSH significant for TAH / BS  Personal health questionnaire:  Is patient Ashkenazi Jewish, have a family history of breast and/or ovarian cancer: no Is there a family history of uterine cancer diagnosed at age < 18, gastrointestinal cancer, urinary tract cancer, family member who is a Field seismologist syndrome-associated carrier: no Is the patient overweight and hypertensive, family history of diabetes, personal history of gestational diabetes, preeclampsia or PCOS: no Is patient over 33, have PCOS,  family history of premature CHD under age 72, diabetes, smoke, have hypertension or peripheral artery disease:  yes At any time, has a partner hit, kicked or otherwise hurt or frightened you?: no Over the past 2 weeks, have you felt down, depressed or hopeless?: no Over the past 2 weeks, have you felt little interest or pleasure in doing things?:no   Gynecologic History Patient's last menstrual period was 06/09/2017. Contraception: status post hysterectomy / bilateral salpingectomy Last Pap: 2019. Results were: normal Last mammogram: 2020. Results were: normal  Obstetric History OB History  Gravida Para Term Preterm AB Living  2 2 2     2   SAB IAB Ectopic Multiple Live Births          2    # Outcome Date GA Lbr Len/2nd Weight Sex Delivery Anes PTL Lv  2 Term 08/24/04 [redacted]w[redacted]d  7 lb 12 oz (3.515 kg) F Vag-Spont EPI  LIV  1 Term 03/30/96 [redacted]w[redacted]d  6 lb 15 oz (3.147 kg) M Vag-Spont None  LIV    Past Medical History:  Diagnosis Date  . Anemia   . Essential hypertension   . Fibroids 2012  . GERD (gastroesophageal reflux disease)    diet controlled, no meds  . Heart palpitations    Noted to have PVCs and no arrhythmia on cardiac monitor -not taking beta  blocker;  ECHO December 2013: EF 55-60% with mild LA ablation. Otherwise normal., no current problems  . SVD (spontaneous vaginal delivery)    x 2  . Syphilis   . Trichomonal infection     Past Surgical History:  Procedure Laterality Date  . CHOLECYSTECTOMY    . EYE SURGERY Left    retinal detachment  . GALLBLADDER SURGERY    . HAND SURGERY    . HYSTERECTOMY ABDOMINAL WITH SALPINGECTOMY Bilateral 09/26/2017   Procedure: HYSTERECTOMY ABDOMINAL WITH BILATERAL SALPINGECTOMY;  Surgeon: Sloan Leiter, MD;  Location: [facility];  Service: Gynecology;  Laterality: Bilateral;  . VAGINAL HYSTERECTOMY Bilateral 09/26/2017   Procedure: ATTEMPETED HYSTERECTOMY VAGINAL WITH BILATERAL SALPINGECTOMY;  Surgeon: Sloan Leiter, MD;  Location: [facility];  Service: Gynecology;  Laterality: Bilateral;     Current Outpatient Medications:  .  diltiazem (CARDIZEM CD) 180 MG 24 hr capsule, TAKE 1 CAPSULE(180 MG) BY MOUTH DAILY, Disp: 30 capsule, Rfl: 0 .  fluconazole (DIFLUCAN) 150 MG tablet, Take 1 tablet (150 mg total) by mouth once for 1 dose., Disp: 1 tablet, Rfl: 0 .  hydrochlorothiazide (HYDRODIURIL) 25 MG tablet, TAKE 1 TABLET BY MOUTH ONCE DAILY, Disp: 90 tablet, Rfl: 0 .  tinidazole (TINDAMAX) 500 MG tablet, Take 2 tablets (1,000 mg total) by mouth daily with breakfast., Disp: 10 tablet, Rfl: 2 .  loratadine (CLARITIN) 10 MG tablet, Take 1 tablet (10 mg  total) by mouth daily., Disp: 30 tablet, Rfl: 11 .  Potassium Chloride ER 20 MEQ TBCR, Take 1 tablet by mouth daily., Disp: , Rfl:  No Known Allergies  Social History   Tobacco Use  . Smoking status: Current Every Day Smoker    Packs/day: 0.50    Years: 10.00    Pack years: 5.00    Types: Cigarettes    Start date: 05/29/2003  . Smokeless tobacco: Never Used  . Tobacco comment: smoke about 4 a day  Substance Use Topics  . Alcohol use: Yes    Alcohol/week: 2.0 standard drinks    Types: 2 Standard drinks or equivalent per week    Comment: Occasionaly      Family History  Problem Relation Age of Onset  . Hypertension Father   . Early death Father   . Diabetes Mother       Review of Systems  Constitutional: negative for fatigue and weight loss Respiratory: negative for cough and wheezing Cardiovascular: negative for chest pain, fatigue and palpitations Gastrointestinal: negative for abdominal pain and change in bowel habits Musculoskeletal:negative for myalgias Neurological: negative for gait problems and tremors Behavioral/Psych: negative for abusive relationship, depression Endocrine: negative for temperature intolerance    Genitourinary:negative for periods, genital lesions, hot flashes. Positive for vaginal discharge and pain with intercourse from recurrent large Bartholin Cysts Integument/breast: negative for breast lump, breast tenderness, nipple discharge and skin lesion(s)    Objective:       BP (!) 166/99   Pulse 87   Wt 167 lb (75.8 kg)   LMP 06/09/2017   BMI 32.61 kg/m  General:   alert and no distress  Skin:   no rash or abnormalities  Lungs:   clear to auscultation bilaterally  Heart:   regular rate and rhythm, S1, S2 normal, no murmur, click, rub or gallop  Breasts:   normal without suspicious masses, skin or nipple changes or axillary nodes  Abdomen:  normal findings: no organomegaly, soft, non-tender and no hernia  Pelvis:  External genitalia: normal general appearance Urinary system: urethral meatus normal and bladder without fullness, nontender Vaginal: normal without tenderness, induration or masses Cervix: absent Adnexa: normal bimanual exam Uterus: absent   Lab Review Urine pregnancy test Labs reviewed yes Radiologic studies reviewed yes  I have spent a total of 20 minutes of face-to-face time, excluding clinical staff time, reviewing notes and preparing to see patient, ordering tests and/or medications, and counseling the patient.  Assessment:     1. Encounter for annual routine gynecological  examination Rx: - CBC with Differential/Platelet - Comprehensive metabolic panel - TSH  2. S/P TAH (total abdominal hysterectomy)  3. Vaginal discharge Rx: - Cervicovaginal ancillary only( )  4. BV (bacterial vaginosis) Rx: - tinidazole (TINDAMAX) 500 MG tablet; Take 2 tablets (1,000 mg total) by mouth daily with breakfast.  Dispense: 10 tablet; Refill: 2  5. Candida vaginitis Rx: - fluconazole (DIFLUCAN) 150 MG tablet; Take 1 tablet (150 mg total) by mouth once for 1 dose.  Dispense: 1 tablet; Refill: 0  6. Health maintenance alteration Rx: - Cholesterol, total - Triglycerides - HDL cholesterol - Hemoglobin A1c  7. Screening for colon cancer Rx: - Ambulatory referral to Gastroenterology  8. Screening breast examination Rx: - MM Digital Screening; Future  9. HTN (hypertension), benign Rx: - Ambulatory referral to Internal Medicine  10. Tobacco dependence - cessation recommended with the aid of medication and behavioral modification    Plan:    Education reviewed:  calcium supplements, depression evaluation, low fat, low cholesterol diet, safe sex/STD prevention, self breast exams, smoking cessation and weight bearing exercise. Mammogram ordered. Follow up in: 1 year. Colonoscopy screening ordered   Meds ordered this encounter  Medications  . tinidazole (TINDAMAX) 500 MG tablet    Sig: Take 2 tablets (1,000 mg total) by mouth daily with breakfast.    Dispense:  10 tablet    Refill:  2  . fluconazole (DIFLUCAN) 150 MG tablet    Sig: Take 1 tablet (150 mg total) by mouth once for 1 dose.    Dispense:  1 tablet    Refill:  0   Orders Placed This Encounter  Procedures  . MM Digital Screening    Standing Status:   Future    Standing Expiration Date:   07/20/2021    Scheduling Instructions:     Please schedule at [facility] in [facility].    Order Specific Question:   Reason for Exam (SYMPTOM  OR DIAGNOSIS REQUIRED)    Answer:   Screening    Order  Specific Question:   Is the patient pregnant?    Answer:   No    Order Specific Question:   Preferred imaging location?    Answer:   External  . CBC with Differential/Platelet  . Comprehensive metabolic panel  . TSH  . Cholesterol, total  . Triglycerides  . HDL cholesterol  . Hemoglobin A1c  . Ambulatory referral to Gastroenterology    Referral Priority:   Routine    Referral Type:   Consultation    Referral Reason:   Specialty Services Required    Number of Visits Requested:   1  . Ambulatory referral to Internal Medicine    Referral Priority:   Routine    Referral Type:   Consultation    Referral Reason:   Specialty Services Required    Requested Specialty:   Internal Medicine    Number of Visits Requested:   1    Shelly Bombard, MD 07/20/2020 9:59 AM

## 2020-07-20 NOTE — Progress Notes (Signed)
Pt would like general health labs.  Pt needs mammo ordered.

## 2020-07-21 ENCOUNTER — Other Ambulatory Visit: Payer: Self-pay | Admitting: Obstetrics

## 2020-07-21 LAB — COMPREHENSIVE METABOLIC PANEL
ALT: 20 IU/L (ref 0–32)
AST: 18 IU/L (ref 0–40)
Albumin/Globulin Ratio: 1.9 (ref 1.2–2.2)
Albumin: 4.5 g/dL (ref 3.8–4.8)
Alkaline Phosphatase: 72 IU/L (ref 44–121)
BUN/Creatinine Ratio: 16 (ref 9–23)
BUN: 10 mg/dL (ref 6–24)
Bilirubin Total: 0.8 mg/dL (ref 0.0–1.2)
CO2: 23 mmol/L (ref 20–29)
Calcium: 9.4 mg/dL (ref 8.7–10.2)
Chloride: 101 mmol/L (ref 96–106)
Creatinine, Ser: 0.64 mg/dL (ref 0.57–1.00)
Globulin, Total: 2.4 g/dL (ref 1.5–4.5)
Glucose: 89 mg/dL (ref 65–99)
Potassium: 3.8 mmol/L (ref 3.5–5.2)
Sodium: 139 mmol/L (ref 134–144)
Total Protein: 6.9 g/dL (ref 6.0–8.5)
eGFR: 110 mL/min/{1.73_m2} (ref >59)

## 2020-07-21 LAB — CBC WITH DIFFERENTIAL/PLATELET
Basophils Absolute: 0 10*3/uL (ref 0.0–0.2)
Basos: 1 %
EOS (ABSOLUTE): 0.2 10*3/uL (ref 0.0–0.4)
Eos: 3 %
Hematocrit: 43.1 % (ref 34.0–46.6)
Hemoglobin: 14.5 g/dL (ref 11.1–15.9)
Immature Grans (Abs): 0 10*3/uL (ref 0.0–0.1)
Immature Granulocytes: 0 %
Lymphocytes Absolute: 2.4 10*3/uL (ref 0.7–3.1)
Lymphs: 28 %
MCH: 30.6 pg (ref 26.6–33.0)
MCHC: 33.6 g/dL (ref 31.5–35.7)
MCV: 91 fL (ref 79–97)
Monocytes Absolute: 0.7 10*3/uL (ref 0.1–0.9)
Monocytes: 8 %
Neutrophils Absolute: 5.3 10*3/uL (ref 1.4–7.0)
Neutrophils: 60 %
Platelets: 280 10*3/uL (ref 150–450)
RBC: 4.74 x10E6/uL (ref 3.77–5.28)
RDW: 12.1 % (ref 11.7–15.4)
WBC: 8.7 10*3/uL (ref 3.4–10.8)

## 2020-07-21 LAB — CERVICOVAGINAL ANCILLARY ONLY
Bacterial Vaginitis (gardnerella): POSITIVE — AB
Candida Glabrata: NEGATIVE
Candida Vaginitis: NEGATIVE
Chlamydia: NEGATIVE
Comment: NEGATIVE
Comment: NEGATIVE
Comment: NEGATIVE
Comment: NEGATIVE
Comment: NEGATIVE
Comment: NORMAL
Neisseria Gonorrhea: NEGATIVE
Trichomonas: NEGATIVE

## 2020-07-21 LAB — HDL CHOLESTEROL: HDL: 60 mg/dL (ref >39)

## 2020-07-21 LAB — HEMOGLOBIN A1C
Est. average glucose Bld gHb Est-mCnc: 108 mg/dL
Hgb A1c MFr Bld: 5.4 % (ref 4.8–5.6)

## 2020-07-21 LAB — TRIGLYCERIDES: Triglycerides: 92 mg/dL (ref 0–149)

## 2020-07-21 LAB — CHOLESTEROL, TOTAL: Cholesterol, Total: 220 mg/dL — ABNORMAL HIGH (ref 100–199)

## 2020-07-21 LAB — TSH: TSH: 1.66 u[IU]/mL (ref 0.450–4.500)

## 2020-07-22 ENCOUNTER — Telehealth: Payer: Self-pay

## 2020-07-22 NOTE — Telephone Encounter (Signed)
S/w pt and advised of results and rx 

## 2020-07-27 ENCOUNTER — Other Ambulatory Visit: Payer: Self-pay | Admitting: Obstetrics

## 2020-07-27 DIAGNOSIS — J301 Allergic rhinitis due to pollen: Secondary | ICD-10-CM

## 2020-07-29 ENCOUNTER — Encounter: Payer: Self-pay | Admitting: Cardiology

## 2020-07-29 ENCOUNTER — Ambulatory Visit: Payer: BLUE CROSS/BLUE SHIELD | Admitting: Cardiology

## 2020-07-29 ENCOUNTER — Other Ambulatory Visit: Payer: Self-pay

## 2020-07-29 VITALS — BP 158/90 | HR 76 | Ht 60.0 in | Wt 169.0 lb

## 2020-07-29 DIAGNOSIS — R002 Palpitations: Secondary | ICD-10-CM | POA: Diagnosis not present

## 2020-07-29 DIAGNOSIS — I83813 Varicose veins of bilateral lower extremities with pain: Secondary | ICD-10-CM | POA: Diagnosis not present

## 2020-07-29 DIAGNOSIS — I10 Essential (primary) hypertension: Secondary | ICD-10-CM

## 2020-07-29 DIAGNOSIS — E785 Hyperlipidemia, unspecified: Secondary | ICD-10-CM

## 2020-07-29 DIAGNOSIS — I358 Other nonrheumatic aortic valve disorders: Secondary | ICD-10-CM

## 2020-07-29 MED ORDER — VALSARTAN 40 MG PO TABS: 40.0000 mg | ORAL_TABLET | Freq: Every day | ORAL | 3 refills | Status: AC

## 2020-07-29 MED ORDER — DILTIAZEM HCL ER COATED BEADS 240 MG PO CP24: 240.0000 mg | ORAL_CAPSULE | Freq: Every day | ORAL | 3 refills | Status: DC

## 2020-07-29 NOTE — Patient Instructions (Addendum)
Medication Instructions:    stop taking Diltiazem CD 180 mg    Start taking Diltiazem CD 240  Mg one daily  Start taking Valsartan 40 mg one tablet daily   *If you need a refill on your cardiac medications before your next appointment, please call your pharmacy*   Lab Work:  Not needed.   Testing/Procedures:  Not needed  Follow-Up: At [facility], you and your health needs are our priority.  As part of our continuing mission to provide you with exceptional heart care, we have created designated Provider Care Teams.  These Care Teams include your primary Cardiologist (physician) and Advanced Practice Providers (APPs -  Physician Assistants and Nurse Practitioners) who all work together to provide you with the care you need, when you need it.     Your next appointment:    3 to 4 month(s) Aug -Sept 2022  The format for your next appointment:   In Person  Provider:   You will see one of the following Advanced Practice Providers on your designated Care Team:    Coletta Memos NP  Then, Glenetta Hew, MD will plan to see you again in 8 month(s) Jan 2023.   Other Instructions information about home monitor equipment   DR HARDING RECOMMENDS THAT YOU  PURCHASES  " Kardia" By [employer] FROM THE  [employer]  APP PLAY STORE.   THE APP IS FREE , BUT THE  EQUIPMENT HAS A COST. IT IS A WAY FOR YOU TO OBTAIN A RECORDING OF YOUR HEART RATE . IT WILL BE A SHORT RHYTHM  STRIP THAT YOU CAN SHOW TO MEDICAL STAFF.  ALSO , YOU MAY [place] - EKG- MONITOR YOU ARE ABLE TO KEEP RECORDINGS ON YOUR SMARTPHONE , INSTEAD OF PURCHASING A SEPARATE EQUIPMENT   Dr Ellyn Hack  recommends portable  ECG monitor  to capture heart rate reading. There is a monitor that can be purchased on [employer]. The  monitor name is "[id number]"  at last checked it cost $79.  Our understanding there no more cost of that purchasing the machine. You will need a computer to be able to upload your  recording and to be printed. It also come with a  USB to charge the monitor.

## 2020-07-29 NOTE — Progress Notes (Signed)
Primary Care Provider: Nolene Ebbs, MD Cardiologist: Glenetta Hew, MD Electrophysiologist: None  Clinic Note: Chief Complaint  Patient presents with  . Follow-up    1 year.  (Last seen by MD in 2018)  . Palpitations    Seem to come and go.  A little bit more frequently.    ===================================  ASSESSMENT/PLAN   Problem List Items Addressed This Visit    Aortic heart murmur on examination    1 out of 6 systolic ejection murmur heard on exam.  Nothing significant noted on echo.  No aortic stenosis.  Probably aortic sclerosis.      Essential hypertension (Chronic)    Hypertensive today.  Needs more control.  Plan: Increase diltiazem 240 mg daily, and start valsartan 40 mg daily.      Relevant Medications   diltiazem (CARDIZEM CD) 240 MG 24 hr capsule   valsartan (DIOVAN) 40 MG tablet   Other Relevant Orders   EKG 12-Lead (Completed)   Heart palpitations - Primary (Chronic)    She tends to have these exacerbation episodes.  Had some last year as well.  Continue to maintain adequate hydration and avoid triggers.  Will increase diltiazem dose to 240 mg.  Recommended home monitor equipment: The smart phone application is free, however the monitor strips tend to cost roughly $80.   Kardia" By [employer]  [employer]. FROM THE  [employer]  APP PLAY STORE.    [employer] - EMAY - EKG- MONITOR       Relevant Orders   EKG 12-Lead (Completed)   Hyperlipidemia with target LDL less than 100 (Chronic)    Monitored by PCP.  I think her most recent lipids were nonfasting.  Not on statin.  Would like to see fasting lipid panel and potentially treat to determine appropriateness of treatment plan.      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  Would like to avoid diuretic beyond  HCTZ.  Stable electrolytes with borderline hypokalemia.      Relevant Medications   diltiazem (CARDIZEM CD) 240 MG 24 hr capsule   valsartan (DIOVAN) 40 MG tablet     ===================================  HPI:    [name] is a 45 y.o. female with a cardiovascular history notable for hypertension and palpitations as well as aortic murmur who presents today for annual follow-up..  I last saw her in September 2018.  [name] was last seen on 07/12/2019 by Coletta Memos, NP for palpitations-1 month follow-up after being seen via telemedicine by Jory Sims, NP (denied any chest pain, dizziness dyspnea fatigue or malaise).  She was rescheduled after contacting the office indicating that she did more frequent palpitations every other day for 2 weeks.  Was instructed take additional dose of Cardizem.  More intense palpitations since July 10, 2019.  No associated lightheadedness or dizziness, syncope or near syncope.  Stable varicose veins.  Mild end of day edema that dissipates in the end of the day.   --> Plan was for echocardiogram followed by 94-month follow-up.     Recommended avoiding triggers for palpitations.  Continue diltiazem 180 mg.  Encouraged adequate hydration.  BP not adequately controlled.  Talk about low-salt diet.  Recommended increased activity and avoid salty 6.  Indicated pressures at home are better controlled.   Also  support stockings and foot elevation..  Lipid panel ordered.  Recent Hospitalizations: None  Reviewed  CV studies:    The following studies were reviewed today: (if available, images/films reviewed: From Epic Chart or Care Everywhere) . Echocardiogram 06/13/2019: EF 60 to 65%.  No R WMA.  Normal RV size and function.  Valves normal  Interval History:   [name] returns here today indicating that she says palpitations can come and go.  Heart rates and go up and down.  Maybe a little more frequent.  They occur  just that every day but some days are worse.  They can go on all day, or maybe only a few minutes.  She does note that there are several triggers such as caffeine and chocolate as well as sweets that certainly make palpitations worse.  She is trying to avoid these triggers and also try to hydrate, but is finding that they occur with or without triggers now.  Not really associated with lightheadedness or dizziness.  No syncope or near syncope.  CV Review of Symptoms (Summary): positive for - edema, palpitations, rapid heart rate and Mild associated dizziness, no syncope or near syncope negative for - chest pain, dyspnea on exertion, irregular heartbeat, orthopnea, paroxysmal nocturnal dyspnea, shortness of breath or TIA/amaurosis fugax, claudication  REVIEWED OF SYSTEMS   Review of Systems  Constitutional: Negative for malaise/fatigue and weight loss.  HENT: Negative for congestion and nosebleeds.   Respiratory: Negative for cough and shortness of breath.   Gastrointestinal: Negative for blood in stool and melena.  Genitourinary: Negative for hematuria.  Musculoskeletal: Negative for falls and joint pain.  Neurological: Positive for dizziness (With prolonged palpitations). Negative for focal weakness.  Psychiatric/Behavioral: Negative for depression and memory loss. The patient is nervous/anxious. The patient does not have insomnia.    I have reviewed and (if needed) personally updated the patient's problem list, medications, allergies, past medical and surgical history, social and family history.   PAST MEDICAL HISTORY   Past Medical History:  Diagnosis Date  . Anemia   . Essential hypertension   . Fibroids 2012  . GERD (gastroesophageal reflux disease)    diet controlled, no meds  . Heart palpitations    Noted to have PVCs and no arrhythmia on cardiac monitor -not taking beta blocker;  ECHO December 2013: EF 55-60% with mild LA ablation. Otherwise normal., no current problems  . SVD  (spontaneous vaginal delivery)    x 2  . Syphilis   . Trichomonal infection     PAST SURGICAL HISTORY   Past Surgical History:  Procedure Laterality Date  . CHOLECYSTECTOMY    . EYE SURGERY Left    retinal detachment  . GALLBLADDER SURGERY    . HAND SURGERY    . HYSTERECTOMY ABDOMINAL WITH SALPINGECTOMY Bilateral 09/26/2017   Procedure: HYSTERECTOMY ABDOMINAL WITH BILATERAL SALPINGECTOMY;  Surgeon: Sloan Leiter, MD;  Location: [facility];  Service: Gynecology;  Laterality: Bilateral;  . TRANSTHORACIC ECHOCARDIOGRAM  05/2019    EF 60 to 65%.  No R WMA.  Normal RV size and function.  Valves normal  . VAGINAL HYSTERECTOMY Bilateral 09/26/2017   Procedure: ATTEMPETED HYSTERECTOMY VAGINAL WITH BILATERAL SALPINGECTOMY;  Surgeon: Sloan Leiter, MD;  Location: [facility];  Service: Gynecology;  Laterality: Bilateral;    Immunization History  Administered Date(s) Administered  . PFIZER(Purple Top)SARS-COV-2 Vaccination 09/24/2019    MEDICATIONS/ALLERGIES   Current Meds  Medication Sig  . diltiazem (CARDIZEM CD) 240 MG 24 hr capsule Take 1  capsule (240 mg total) by mouth daily.  . hydrochlorothiazide (HYDRODIURIL) 25 MG tablet TAKE 1 TABLET BY MOUTH ONCE DAILY  . loratadine (CLARITIN) 10 MG tablet TAKE 1 TABLET(10 MG) BY MOUTH DAILY  . meloxicam (MOBIC) 15 MG tablet Take 1 tablet by mouth daily.  . Potassium Chloride ER 20 MEQ TBCR Take 1 tablet by mouth daily.  Marland Kitchen tinidazole (TINDAMAX) 500 MG tablet Take 2 tablets (1,000 mg total) by mouth daily with breakfast.  . valsartan (DIOVAN) 40 MG tablet Take 1 tablet (40 mg total) by mouth daily.  . [DISCONTINUED] diltiazem (CARDIZEM CD) 180 MG 24 hr capsule TAKE 1 CAPSULE(180 MG) BY MOUTH DAILY    No Known Allergies  SOCIAL HISTORY/FAMILY HISTORY   Reviewed in Epic:  Pertinent findings:  Social History   Tobacco Use  . Smoking status: Current Every Day Smoker    Packs/day: 0.50    Years: 10.00    Pack years: 5.00    Types: Cigarettes     Start date: 05/29/2003  . Smokeless tobacco: Never Used  . Tobacco comment: smoke about 4 a day  Vaping Use  . Vaping Use: Never used  Substance Use Topics  . Alcohol use: Yes    Alcohol/week: 2.0 standard drinks    Types: 2 Standard drinks or equivalent per week    Comment: Occasionaly   . Drug use: No   Social History   Social History Narrative   Single. Still smokes maybe a quarter of a pack a day. Does not take alcohol or caffeine to    OBJCTIVE -PE, EKG, labs   Wt Readings from Last 3 Encounters:  07/29/20 169 lb (76.7 kg)  07/20/20 167 lb (75.8 kg)  08/13/19 167 lb 11.2 oz (76.1 kg)    Physical Exam: BP (!) 158/90 (BP Location: Right Arm, Patient Position: Sitting, Cuff Size: Large)   Pulse 76   Ht 5' (1.524 m)   Wt 169 lb (76.7 kg)   LMP 06/09/2017   BMI 33.01 kg/m  Physical Exam Vitals reviewed.  Constitutional:      General: She is not in acute distress.    Appearance: Normal appearance. She is normal weight. She is not ill-appearing or toxic-appearing.  HENT:     Head: Normocephalic and atraumatic.  Neck:     Vascular: No carotid bruit, hepatojugular reflux or JVD.  Cardiovascular:     Rate and Rhythm: Normal rate and regular rhythm.  No extrasystoles are present.    Chest Wall: PMI is not displaced.     Pulses: Normal pulses.     Heart sounds: S1 normal and S2 normal. Murmur (1/6 SEM at RUSB.) heard.  No friction rub. No gallop.   Pulmonary:     Effort: Pulmonary effort is normal. No respiratory distress.     Breath sounds: Normal breath sounds.  Chest:     Chest wall: No tenderness.  Musculoskeletal:        General: Swelling (Trivial edema.  Mild varicose veins.  Tender but not engorged.) present. Normal range of motion.     Cervical back: Normal range of motion and neck supple.  Skin:    General: Skin is warm and dry.  Neurological:     General: No focal deficit present.     Mental Status: She is alert and oriented to person, place, and time.   Psychiatric:        Mood and Affect: Mood normal.        Behavior: Behavior normal.  Thought Content: Thought content normal.        Judgment: Judgment normal.     Adult ECG Report  Rate: 76 ;  Rhythm: normal sinus rhythm and Normal axis, intervals and durations.;   Narrative Interpretation: Stable  Recent Labs: Reviewed -- Non-fasting Lipids  Lab Results  Component Value Date   CHOL 220 (H) 07/20/2020   HDL 60 07/20/2020   LDLDIRECT 150 (H) 07/12/2019   TRIG 92 07/20/2020   Lab Results  Component Value Date   CREATININE 0.64 07/20/2020   BUN 10 07/20/2020   NA 139 07/20/2020   K 3.8 07/20/2020   CL 101 07/20/2020   CO2 23 07/20/2020   CBC Latest Ref Rng & Units 07/20/2020 01/21/2019 05/21/2018  WBC 3.4 - 10.8 x10E3/uL 8.7 9.2 8.5  Hemoglobin 11.1 - 15.9 g/dL 14.5 13.5 13.9  Hematocrit 34.0 - 46.6 % 43.1 39.6 39.3  Platelets 150 - 450 x10E3/uL 280 358 301    Lab Results  Component Value Date   TSH 1.660 07/20/2020    ==================================================  COVID-19 Education: The signs and symptoms of COVID-19 were discussed with the patient and how to seek care for testing (follow up with PCP or arrange E-visit).    I spent a total of 21 minutes with the patient spent in direct patient consultation.  Additional time spent with chart review  / charting (studies, outside notes, etc): 14 min Total Time: 35 min  Current medicines are reviewed at length with the patient today.  (+/- concerns) none  This visit occurred during the SARS-CoV-2 public health emergency.  Safety protocols were in place, including screening questions prior to the visit, additional usage of staff PPE, and extensive cleaning of exam room while observing appropriate contact time as indicated for disinfecting solutions.  Notice: This dictation was prepared with Dragon dictation along with smaller phrase technology. Any transcriptional errors that result from this process are  unintentional and may not be corrected upon review.  Patient Instructions / Medication Changes & Studies & Tests Ordered   Patient Instructions  Medication Instructions:    stop taking Diltiazem CD 180 mg    Start taking Diltiazem CD 240  Mg one daily  Start taking Valsartan 40 mg one tablet daily   *If you need a refill on your cardiac medications before your next appointment, please call your pharmacy*   Lab Work:  Not needed.   Testing/Procedures:  Not needed  Follow-Up: At [facility], you and your health needs are our priority.  As part of our continuing mission to provide you with exceptional heart care, we have created designated Provider Care Teams.  These Care Teams include your primary Cardiologist (physician) and Advanced Practice Providers (APPs -  Physician Assistants and Nurse Practitioners) who all work together to provide you with the care you need, when you need it.     Your next appointment:    3 to 4 month(s) Aug -Sept 2022  The format for your next appointment:   In Person  Provider:   You will see one of the following Advanced Practice Providers on your designated Care Team:    Coletta Memos NP  Then, Glenetta Hew, MD will plan to see you again in 8 month(s) Jan 2023.   Other Instructions information about home monitor equipment   DR Jem Castro RECOMMENDS THAT YOU  PURCHASES  " Kardia" By [employer]  INC. FROM THE  GOOGLE[employer]  APP PLAY STORE.   THE APP IS FREE , BUT THE  EQUIPMENT HAS A COST. IT IS A WAY FOR YOU TO OBTAIN A RECORDING OF YOUR HEART RATE . IT WILL BE A SHORT RHYTHM  STRIP THAT YOU CAN SHOW TO MEDICAL STAFF.  ALSO , YOU MAY [facility] - EKG- MONITOR YOU ARE ABLE TO KEEP RECORDINGS ON YOUR SMARTPHONE , INSTEAD OF PURCHASING A SEPARATE EQUIPMENT   Dr Ellyn Hack  recommends portable  ECG monitor  to capture heart rate reading. There is a monitor that can be purchased on [employer]. The  monitor name is "EMAY"  at last  checked it cost $79.  Our understanding there no more cost of that purchasing the machine. You will need a computer to be able to upload your recording and to be printed. It also come with a  USB to charge the monitor.   Studies Ordered:   Orders Placed This Encounter  Procedures  . EKG 12-Lead     Glenetta Hew, M.D., M.S. Interventional Cardiologist   Pager # [phone] Phone # [phone] [address] [place], [place] [address]   Thank you for choosing [facility] at [facility]!!

## 2020-08-12 ENCOUNTER — Other Ambulatory Visit: Payer: Self-pay

## 2020-08-12 ENCOUNTER — Encounter (HOSPITAL_COMMUNITY): Payer: Self-pay

## 2020-08-12 ENCOUNTER — Ambulatory Visit (HOSPITAL_COMMUNITY)
Admission: EM | Admit: 2020-08-12 | Discharge: 2020-08-12 | Disposition: A | Discharge status: Home / Self Care | Payer: BLUE CROSS/BLUE SHIELD | Attending: Emergency Medicine | Admitting: Emergency Medicine

## 2020-08-12 DIAGNOSIS — J069 Acute upper respiratory infection, unspecified: Secondary | ICD-10-CM

## 2020-08-12 DIAGNOSIS — I10 Essential (primary) hypertension: Secondary | ICD-10-CM | POA: Insufficient documentation

## 2020-08-12 DIAGNOSIS — U071 COVID-19: Secondary | ICD-10-CM | POA: Insufficient documentation

## 2020-08-12 DIAGNOSIS — Z79899 Other long term (current) drug therapy: Secondary | ICD-10-CM | POA: Diagnosis not present

## 2020-08-12 DIAGNOSIS — F17210 Nicotine dependence, cigarettes, uncomplicated: Secondary | ICD-10-CM | POA: Diagnosis not present

## 2020-08-12 DIAGNOSIS — R0981 Nasal congestion: Secondary | ICD-10-CM | POA: Diagnosis present

## 2020-08-12 NOTE — ED Triage Notes (Signed)
Pt c/o nasal congestion, generalized body aches X 4 days. She denies fever.

## 2020-08-12 NOTE — ED Provider Notes (Signed)
[facility]  ____________________________________________  Time seen: Approximately 7:57 PM  I have reviewed the triage vital signs and the nursing notes.   HISTORY  Chief Complaint No chief complaint on file.   Historian Patient     HPI [name] is a 45 y.o. female presents to the urgent care with nasal congestion and body aches for the past 4 days.  Patient reports that she took an at-home COVID-19 test that was positive and would like confirmation.  Denies chest pain, chest tightness or abdominal pain.  No sick contacts in the home.  Patient states that she has been exposed to several COVID-19 positive people at work.   Past Medical History:  Diagnosis Date  . Anemia   . Essential hypertension   . Fibroids 2012  . GERD (gastroesophageal reflux disease)    diet controlled, no meds  . Heart palpitations    Noted to have PVCs and no arrhythmia on cardiac monitor -not taking beta blocker;  ECHO December 2013: EF 55-60% with mild LA ablation. Otherwise normal., no current problems  . SVD (spontaneous vaginal delivery)    x 2  . Syphilis   . Trichomonal infection      Immunizations up to date:  Yes.     Past Medical History:  Diagnosis Date  . Anemia   . Essential hypertension   . Fibroids 2012  . GERD (gastroesophageal reflux disease)    diet controlled, no meds  . Heart palpitations    Noted to have PVCs and no arrhythmia on cardiac monitor -not taking beta blocker;  ECHO December 2013: EF 55-60% with mild LA ablation. Otherwise normal., no current problems  . SVD (spontaneous vaginal delivery)    x 2  . Syphilis   . Trichomonal infection     Patient Active Problem List   Diagnosis Date Noted  . 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 History of total abdominal hysterectomy 07/25/2019  . Varicose veins of bilateral lower extremities with pain 12/25/2016  . Aortic heart murmur on examination 06/29/2015  . Obesity (BMI 30-39.9) 12/29/2013  .  Hyperlipidemia with target LDL less than 100 12/29/2013  . Essential hypertension   . Heart palpitations     Past Surgical History:  Procedure Laterality Date  . CHOLECYSTECTOMY    . EYE SURGERY Left    retinal detachment  . GALLBLADDER SURGERY    . HAND SURGERY    . HYSTERECTOMY ABDOMINAL WITH SALPINGECTOMY Bilateral 09/26/2017   Procedure: HYSTERECTOMY ABDOMINAL WITH BILATERAL SALPINGECTOMY;  Surgeon: Sloan Leiter, MD;  Location: [facility];  Service: Gynecology;  Laterality: Bilateral;  . VAGINAL HYSTERECTOMY Bilateral 09/26/2017   Procedure: ATTEMPETED HYSTERECTOMY VAGINAL WITH BILATERAL SALPINGECTOMY;  Surgeon: Sloan Leiter, MD;  Location: [facility];  Service: Gynecology;  Laterality: Bilateral;    Prior to Admission medications   Medication Sig Start Date End Date Taking? Authorizing Provider  diltiazem (CARDIZEM CD) 240 MG 24 hr capsule Take 1 capsule (240 mg total) by mouth daily. 07/29/20 10/27/20  Leonie Man, MD  hydrochlorothiazide (HYDRODIURIL) 25 MG tablet TAKE 1 TABLET BY MOUTH ONCE DAILY 03/30/18   Leonie Man, MD  loratadine (CLARITIN) 10 MG tablet TAKE 1 TABLET(10 MG) BY MOUTH DAILY 07/27/20   Constant, Peggy, MD  meloxicam (MOBIC) 15 MG tablet Take 1 tablet by mouth daily. 06/30/20   [provider]  Potassium Chloride ER 20 MEQ TBCR Take 1 tablet by mouth daily. 01/12/19   [provider]  tinidazole [facility])  500 MG tablet Take 2 tablets (1,000 mg total) by mouth daily with breakfast. 07/20/20   Shelly Bombard, MD  valsartan (DIOVAN) 40 MG tablet Take 1 tablet (40 mg total) by mouth daily. 07/29/20   Leonie Man, MD    Allergies Patient has no known allergies.  Family History  Problem Relation Age of Onset  . Hypertension Father   . Early death Father   . Diabetes Mother     Social History Social History   Tobacco Use  . Smoking status: Current Every Day Smoker    Packs/day: 0.50    Years: 10.00    Pack years: 5.00    Types:  Cigarettes    Start date: 05/29/2003  . Smokeless tobacco: Never Used  . Tobacco comment: smoke about 4 a day  Vaping Use  . Vaping Use: Never used  Substance Use Topics  . Alcohol use: Yes    Alcohol/week: 2.0 standard drinks    Types: 2 Standard drinks or equivalent per week    Comment: Occasionaly   . Drug use: No     Review of Systems  Constitutional: No fever/chills Eyes:  No discharge ENT: Patient has nasal congestion.  Respiratory: no cough. No SOB/ use of accessory muscles to breath Gastrointestinal:   No nausea, no vomiting.  No diarrhea.  No constipation. Musculoskeletal: Negative for musculoskeletal pain. Skin: Negative for rash, abrasions, lacerations, ecchymosis. ____________________________________________   PHYSICAL EXAM:  VITAL SIGNS: ED Triage Vitals  Enc Vitals Group     BP 08/12/20 1929 (!) 173/98     Pulse Rate 08/12/20 1929 88     Resp 08/12/20 1929 18     Temp 08/12/20 1929 98.6 F (37 C)     Temp Source 08/12/20 1929 Oral     SpO2 08/12/20 1929 99 %     Weight --      Height --      Head Circumference --      Peak Flow --      Pain Score 08/12/20 1927 6     Pain Loc --      Pain Edu? --      Excl. in [place]? --     Constitutional: Alert and oriented. Patient is lying supine. Eyes: Conjunctivae are normal. PERRL. EOMI. Head: Atraumatic. ENT:      Ears: Tympanic membranes are mildly injected with mild effusion bilaterally.       Nose: No congestion/rhinnorhea.      Mouth/Throat: Mucous membranes are moist. Posterior pharynx is mildly erythematous.  Hematological/Lymphatic/Immunilogical: No cervical lymphadenopathy.  Cardiovascular: Normal rate, regular rhythm. Normal S1 and S2.  Good peripheral circulation. Respiratory: Normal respiratory effort without tachypnea or retractions. Lungs CTAB. Good air entry to the bases with no decreased or absent breath sounds. Gastrointestinal: Bowel sounds 4 quadrants. Soft and nontender to palpation. No  guarding or rigidity. No palpable masses. No distention. No CVA tenderness. Musculoskeletal: Full range of motion to all extremities. No gross deformities appreciated. Neurologic:  Normal speech and language. No gross focal neurologic deficits are appreciated.  Skin:  Skin is warm, dry and intact. No rash noted. Psychiatric: Mood and affect are normal. Speech and behavior are normal. Patient exhibits appropriate insight and judgement.    ____________________________________________   LABS (all labs ordered are listed, but only abnormal results are displayed)  Labs Reviewed  SARS CORONAVIRUS 2 (TAT 6-24 HRS)   ____________________________________________  EKG   ____________________________________________  RADIOLOGY  No results found.  ____________________________________________  PROCEDURES  Procedure(s) performed:     Procedures     Medications - No data to display   ____________________________________________   INITIAL IMPRESSION / ASSESSMENT AND PLAN / ED COURSE  Pertinent labs & imaging results that were available during my care of the patient were reviewed by me and considered in my medical decision making (see chart for details).      Assessment and plan Viral URI 45 year old female presents to the emergency department with nasal congestion and body aches for the past 4 days and a positive at-home COVID-19 test.  Patient was hypertensive at triage but vital signs otherwise reassuring.  Send off COVID-19 testing is in process at this time.  Rest and hydration were encouraged at home.  Tylenol and ibuprofen alternating were recommended for fever if fever occurs.     ____________________________________________  FINAL CLINICAL IMPRESSION(S) / ED DIAGNOSES  Final diagnoses:  Viral URI      NEW MEDICATIONS STARTED DURING THIS VISIT:  ED Discharge Orders    None          This chart was dictated using voice recognition  software/Dragon. Despite best efforts to proofread, errors can occur which can change the meaning. Any change was purely unintentional.     Lannie Fields, PA-C 08/12/20 1959

## 2020-08-13 ENCOUNTER — Ambulatory Visit (HOSPITAL_COMMUNITY): Payer: Self-pay

## 2020-08-13 LAB — SARS CORONAVIRUS 2 (TAT 6-24 HRS): SARS Coronavirus 2: POSITIVE — AB

## 2020-08-14 ENCOUNTER — Telehealth: Payer: Self-pay | Admitting: Adult Health

## 2020-08-14 NOTE — Telephone Encounter (Signed)
Called to discuss with patient about COVID-19 symptoms and the use of one of the available treatments for those with mild to moderate Covid symptoms and at a high risk of hospitalization.  Pt appears to qualify for outpatient treatment due to co-morbid conditions and/or a member of an at-risk group in accordance with the FDA Emergency Use Authorization.    Symptom onset: 08/08/2020, patient is outside window to receive treatment at this time.  IV treatment not available until Monday, and patient will be day 10 at that point.  She is improving clinically, so we discussed saline nasal spray and flonase.  Scot Dock

## 2020-08-14 NOTE — Telephone Encounter (Signed)
Called to discuss with patient about COVID-19 symptoms and the use of one of the available treatments for those with mild to moderate Covid symptoms and at a high risk of hospitalization.  Pt appears to qualify for outpatient treatment due to co-morbid conditions and/or a member of an at-risk group in accordance with the FDA Emergency Use Authorization.    Symptom onset: 08/08/2020 Vaccinated: yes Booster? unsure Immunocompromised? no Qualifiers: BMI/HTN NIH Criteria: 2  Unable to reach pt - Tina

## 2020-08-19 ENCOUNTER — Ambulatory Visit: Payer: BLUE CROSS/BLUE SHIELD | Admitting: Obstetrics and Gynecology

## 2020-08-24 ENCOUNTER — Encounter: Payer: Self-pay | Admitting: Cardiology

## 2020-08-24 NOTE — Assessment & Plan Note (Signed)
1 out of 6 systolic ejection murmur heard on exam.  Nothing significant noted on echo.  No aortic stenosis.  Probably aortic sclerosis.

## 2020-08-24 NOTE — Assessment & Plan Note (Signed)
Hypertensive today.  Needs more control.  Plan: Increase diltiazem 240 mg daily, and start valsartan 40 mg daily.

## 2020-08-24 NOTE — Assessment & Plan Note (Signed)
Continue to recommend support stockings and foot elevation.  Would like to avoid diuretic beyond HCTZ.  Stable electrolytes with borderline hypokalemia.

## 2020-08-24 NOTE — Assessment & Plan Note (Signed)
Monitored by PCP.  I think her most recent lipids were nonfasting.  Not on statin.  Would like to see fasting lipid panel and potentially treat to determine appropriateness of treatment plan.

## 2020-08-24 NOTE — Assessment & Plan Note (Signed)
She tends to have these exacerbation episodes.  Had some last year as well.  Continue to maintain adequate hydration and avoid triggers.  Will increase diltiazem dose to 240 mg.  Recommended home monitor equipment: The smart phone application is free, however the monitor strips tend to cost roughly $80.   [employer]" [employer]  [employer] FROM THE  [employer].    [employer] - EMAY - EKG- MONITOR

## 2020-09-01 ENCOUNTER — Other Ambulatory Visit: Payer: Self-pay | Admitting: Obstetrics

## 2020-09-01 ENCOUNTER — Ambulatory Visit (INDEPENDENT_AMBULATORY_CARE_PROVIDER_SITE_OTHER): Payer: BLUE CROSS/BLUE SHIELD | Admitting: Obstetrics and Gynecology

## 2020-09-01 ENCOUNTER — Other Ambulatory Visit: Payer: Self-pay

## 2020-09-01 ENCOUNTER — Encounter: Payer: Self-pay | Admitting: Obstetrics and Gynecology

## 2020-09-01 VITALS — Ht 60.0 in | Wt 169.7 lb

## 2020-09-01 DIAGNOSIS — N6320 Unspecified lump in the left breast, unspecified quadrant: Secondary | ICD-10-CM

## 2020-09-01 DIAGNOSIS — N750 Cyst of Bartholin's gland: Secondary | ICD-10-CM | POA: Insufficient documentation

## 2020-09-01 DIAGNOSIS — N907 Vulvar cyst: Secondary | ICD-10-CM | POA: Diagnosis not present

## 2020-09-01 NOTE — Progress Notes (Signed)
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 Swelling is located on the left. Patient states that the area is not painful, but causes problems during intercourse.

## 2020-09-01 NOTE — Progress Notes (Signed)
Patient ID: [name], female   DOB: [birth year], 45 y.o.   MRN: [id number] Ms Horrell presents for eval of left labial cyct and bartolin cyst. Left labial cyst has been drained in the past but returned several months afterwards. Bartholin cyst noted within the last several months. She denies any pain except with intercourse. H/O TAH in the past  PE AF VSS Lungs clear Heart RRR Abd soft + BS GU Nl EGBUS, left labial cyst @ 2 cm and left bartholin cyst, each not painful  A/P Labial/Bartholin cyst  Tx option reviewed with pt. Surgerical excision recommended and reviewed with pt. R/B/Post op care discussed. Will schedule and f/u with post op appt.

## 2020-09-02 ENCOUNTER — Telehealth: Payer: Self-pay | Admitting: *Deleted

## 2020-09-02 NOTE — Telephone Encounter (Signed)
Call to patient. Per DPR, can leave message on voice mail. Left message calling to review scheduling options of 7-16 or 11-04-20. Can call back to [phone].

## 2020-09-07 NOTE — Telephone Encounter (Signed)
Call to patient regarding scheduling for surgery. Left message, current date options of 7-19, 7-26 and 8-10. Request call back to review scheduling.[phone].

## 2020-09-14 ENCOUNTER — Encounter: Payer: Self-pay | Admitting: *Deleted

## 2020-09-14 NOTE — Telephone Encounter (Signed)
Call to patient. Surgery date options discussed. Patient selects 11-17-20. Aware time is tentative- estimate 3pm- [facility]. Advised will receive letter in mail and pre-op call.    Encounter closed.

## 2020-10-01 ENCOUNTER — Other Ambulatory Visit: Payer: Self-pay | Admitting: Cardiology

## 2020-10-01 NOTE — Telephone Encounter (Signed)
Change in therapy. 

## 2020-10-09 ENCOUNTER — Other Ambulatory Visit: Payer: Self-pay | Admitting: Obstetrics

## 2020-10-09 DIAGNOSIS — B9689 Other specified bacterial agents as the cause of diseases classified elsewhere: Secondary | ICD-10-CM

## 2020-10-09 DIAGNOSIS — N760 Acute vaginitis: Secondary | ICD-10-CM

## 2020-10-12 NOTE — Telephone Encounter (Signed)
Please review

## 2020-10-13 ENCOUNTER — Ambulatory Visit (INDEPENDENT_AMBULATORY_CARE_PROVIDER_SITE_OTHER): Payer: BLUE CROSS/BLUE SHIELD | Admitting: Obstetrics

## 2020-10-13 ENCOUNTER — Other Ambulatory Visit: Payer: Self-pay

## 2020-10-13 ENCOUNTER — Other Ambulatory Visit (HOSPITAL_COMMUNITY)
Admission: RE | Admit: 2020-10-13 | Discharge: 2020-10-13 | Disposition: A | Discharge status: Home / Self Care | Payer: BLUE CROSS/BLUE SHIELD | Source: Ambulatory Visit | Attending: Obstetrics | Admitting: Obstetrics

## 2020-10-13 VITALS — BP 149/95 | HR 82 | Wt 166.0 lb

## 2020-10-13 DIAGNOSIS — N898 Other specified noninflammatory disorders of vagina: Secondary | ICD-10-CM | POA: Insufficient documentation

## 2020-10-13 DIAGNOSIS — Z1211 Encounter for screening for malignant neoplasm of colon: Secondary | ICD-10-CM | POA: Diagnosis not present

## 2020-10-13 DIAGNOSIS — Z1212 Encounter for screening for malignant neoplasm of rectum: Secondary | ICD-10-CM

## 2020-10-13 NOTE — Progress Notes (Signed)
Pt presents for problem visit today. Wants refills on Tindamax and Diflucan.   Pt declines swab.

## 2020-10-13 NOTE — Progress Notes (Signed)
Patient ID: [name], female   DOB: 1975/06/06, 45 y.o.   MRN: [id number]  Chief Complaint  Patient presents with   Vaginitis    HPI [name] is a 45 y.o. female.  Complains of vaginal discharge HPI  Past Medical History:  Diagnosis Date   Anemia    Essential hypertension    Fibroids 2012   GERD (gastroesophageal reflux disease)    diet controlled, no meds   Heart palpitations    Noted to have PVCs and no arrhythmia on cardiac monitor -not taking beta blocker;  ECHO December 2013: EF 55-60% with mild LA ablation. Otherwise normal., no current problems   SVD (spontaneous vaginal delivery)    x 2   Syphilis    Trichomonal infection     Past Surgical History:  Procedure Laterality Date   CHOLECYSTECTOMY     EYE SURGERY Left    retinal detachment   GALLBLADDER SURGERY     HAND SURGERY     HYSTERECTOMY ABDOMINAL WITH SALPINGECTOMY Bilateral 09/26/2017   Procedure: HYSTERECTOMY ABDOMINAL WITH BILATERAL SALPINGECTOMY;  Surgeon: Sloan Leiter, MD;  Location: [facility];  Service: Gynecology;  Laterality: Bilateral;   TRANSTHORACIC ECHOCARDIOGRAM  05/2019    EF 60 to 65%.  No R WMA.  Normal RV size and function.  Valves normal   VAGINAL HYSTERECTOMY Bilateral 09/26/2017   Procedure: ATTEMPETED HYSTERECTOMY VAGINAL WITH BILATERAL SALPINGECTOMY;  Surgeon: Sloan Leiter, MD;  Location: [facility];  Service: Gynecology;  Laterality: Bilateral;    Family History  Problem Relation Age of Onset   Hypertension Father    Early death Father    Diabetes Mother     Social History Social History   Tobacco Use   Smoking status: Every Day    Packs/day: 0.50    Years: 10.00    Pack years: 5.00    Types: Cigarettes    Start date: 05/29/2003   Smokeless tobacco: Never   Tobacco comments:    smoke about 4 a day  Vaping Use   Vaping Use: Never used  Substance Use Topics   Alcohol use: Yes    Alcohol/week: 2.0 standard drinks    Types: 2 Standard drinks or equivalent  per week    Comment: Occasionaly    Drug use: No    No Known Allergies  Current Outpatient Medications  Medication Sig Dispense Refill   diltiazem (CARDIZEM CD) 240 MG 24 hr capsule Take 1 capsule (240 mg total) by mouth daily. 90 capsule 3   hydrochlorothiazide (HYDRODIURIL) 25 MG tablet TAKE 1 TABLET BY MOUTH ONCE DAILY 90 tablet 0   loratadine (CLARITIN) 10 MG tablet TAKE 1 TABLET(10 MG) BY MOUTH DAILY 30 tablet 11   Potassium Chloride ER 20 MEQ TBCR Take 1 tablet by mouth daily.     valsartan (DIOVAN) 40 MG tablet Take 1 tablet (40 mg total) by mouth daily. 90 tablet 3   meloxicam (MOBIC) 15 MG tablet Take 1 tablet by mouth daily. (Patient not taking: No sig reported)     No current facility-administered medications for this visit.    Review of Systems Review of Systems Constitutional: negative for fatigue and weight loss Respiratory: negative for cough and wheezing Cardiovascular: negative for chest pain, fatigue and palpitations Gastrointestinal: negative for abdominal pain and change in bowel habits Genitourinary: positive for vaginal discharge Integument/breast: negative for nipple discharge Musculoskeletal:negative for myalgias Neurological: negative for gait problems and tremors Behavioral/Psych: negative for abusive relationship, depression Endocrine: negative for  temperature intolerance      Blood pressure (!) 149/95, pulse 82, weight 166 lb (75.3 kg), last menstrual period 06/09/2017.  Physical Exam Physical Exam General:   Alert and no distress  Skin:   no rash or abnormalities  Lungs:   clear to auscultation bilaterally  Heart:   regular rate and rhythm, S1, S2 normal, no murmur, click, rub or gallop  Breasts:   Not examined  Abdomen:  normal findings: no organomegaly, soft, non-tender and no hernia  Pelvis:  External genitalia: normal general appearance Urinary system: urethral meatus normal and bladder without fullness, nontender Vaginal: normal without  tenderness, induration or masses Cervix: absent Adnexa: normal bimanual exam Uterus: absent    I have spent a total of 20 minutes of face-to-face time, excluding clinical staff time, reviewing notes and preparing to see patient, ordering tests and/or medications, and counseling the patient.   Data Reviewed Wet prep Cultures  Assessment     1. Vaginal discharge Rx: - Cervicovaginal ancillary only( [place])  2. Screening for colorectal cancer Rx: - Ambulatory referral to Gastroenterology     Plan Follow up prn   Shelly Bombard, MD 10/13/2020 3:33 PM

## 2020-10-14 LAB — CERVICOVAGINAL ANCILLARY ONLY
Bacterial Vaginitis (gardnerella): POSITIVE — AB
Candida Glabrata: NEGATIVE
Candida Vaginitis: NEGATIVE
Comment: NEGATIVE
Comment: NEGATIVE
Comment: NEGATIVE
Comment: NEGATIVE
Trichomonas: NEGATIVE

## 2020-10-19 ENCOUNTER — Other Ambulatory Visit: Payer: Self-pay | Admitting: Obstetrics

## 2020-10-19 DIAGNOSIS — B9689 Other specified bacterial agents as the cause of diseases classified elsewhere: Secondary | ICD-10-CM

## 2020-10-19 DIAGNOSIS — N760 Acute vaginitis: Secondary | ICD-10-CM

## 2020-10-19 MED ORDER — METRONIDAZOLE 500 MG PO TABS: 500.0000 mg | ORAL_TABLET | Freq: Two times a day (BID) | ORAL | 2 refills | Status: DC

## 2020-10-28 NOTE — Progress Notes (Signed)
Cardiology Clinic Note   Patient Name: [name] Date of Encounter: 10/30/2020  Primary Care Provider:  Nolene Ebbs, MD Primary Cardiologist:  Tina Hew, MD  Patient Profile    [name] 45 year old female presents today for follow-up evaluation of her palpitations and essential hypertension.  Past Medical History    Past Medical History:  Diagnosis Date   Anemia    Essential hypertension    Fibroids 2012   GERD (gastroesophageal reflux disease)    diet controlled, no meds   Heart palpitations    Noted to have PVCs and no arrhythmia on cardiac monitor -not taking beta blocker;  ECHO December 2013: EF 55-60% with mild LA ablation. Otherwise normal., no current problems   SVD (spontaneous vaginal delivery)    x 2   Syphilis    Trichomonal infection    Past Surgical History:  Procedure Laterality Date   CHOLECYSTECTOMY     EYE SURGERY Left    retinal detachment   GALLBLADDER SURGERY     HAND SURGERY     HYSTERECTOMY ABDOMINAL WITH SALPINGECTOMY Bilateral 09/26/2017   Procedure: HYSTERECTOMY ABDOMINAL WITH BILATERAL SALPINGECTOMY;  Surgeon: Tina Leiter, MD;  Location: [facility];  Service: Gynecology;  Laterality: Bilateral;   TRANSTHORACIC ECHOCARDIOGRAM  05/2019    EF 60 to 65%.  No R WMA.  Normal RV size and function.  Valves normal   VAGINAL HYSTERECTOMY Bilateral 09/26/2017   Procedure: ATTEMPETED HYSTERECTOMY VAGINAL WITH BILATERAL SALPINGECTOMY;  Surgeon: Tina Leiter, MD;  Location: [facility];  Service: Gynecology;  Laterality: Bilateral;    Allergies  No Known Allergies  History of Present Illness    [name] has a PMH of essential hypertension, obesity, palpitations, hyperlipidemia, and aortic murmur.   At her office visit on 12/02/2018 she was found to have an aortic murmur and an echocardiogram was ordered.  Dr. Ellyn Benson recommended wearing lower extremity support stockings for varicose veins.  She was seen by Tina Sims, DNP on  05/28/2019 virtually for follow-up evaluation.  She continued to have palpitations at that time.  She had been drinking caffeinated beverages.  She stated that her blood pressure has been okay each time she had seen her PCP for gynecologist.  She denied chest pain, dizziness, DOE, fatigue and malaise.  She was compliant with her medication at that time.  She indicated that her labs were completed every 6 months with her PCP.   She contacted nurse triage line on 07/10/2019.  During that time she stated that she had noticed palpitations every other day for 2 weeks.  She had spoken with the on-call provider on 07/09/2019 and was  instructed to take another dose of Cardizem.  She was concerned that medication dosing was different and that she would be taking too much medication.   She presented to the clinic 07/12/2019 for follow-up evaluation and stated she had noticed intermittent episodes of palpitations.  They were more intense on 07/10/2019.  She stated she had not done anything differently and wanted to be seen.  She had not had any further episodes of palpitations since that time.  She continued to be compliant with her diltiazem and her hydrochlorothiazide.  She had noticed that she had some varicose veins and had increased lower extremity swelling through the day but it did dissipate overnight.  I  recommended a low-sodium heart healthy diet, lower extremity support stockings, avoiding triggers for palpitations and reviewed her echocardiogram.  Ordered TSH and BMP.  I  planned follow-up with Dr. Ellyn Benson in 3 months.  She was seen by Dr. Ellyn Benson on 07/29/2020.  She was noted to be hypertensive at that time with a blood pressure of 158/90.  Her diltiazem was increased to 240 mg daily and she was started on valsartan 40 mg daily.   She presents the clinic today for follow-up evaluation states she feels well.  She has been increasing her physical activity and is walking 30 minutes 2 times per day.  She is also  planning to head back to the gym.  She has not been monitoring her blood pressure at home.  Initially in the clinic today her blood pressure is 142/88 and on recheck is 128/72.  We reviewed the importance of low-salt diet and avoiding salt seasonings with her foods.  I will give her the salty 6 diet sheet, have her increase her physical activity as tolerated, order BMP, fasting lipids and LFTs and follow-up in 6 months.  Today she denies chest pain, shortness of breath, lower extremity edema, fatigue, palpitations, melena, hematuria, hemoptysis, diaphoresis, weakness, presyncope, syncope, orthopnea, and PND.  Home Medications    Prior to Admission medications   Medication Sig Start Date End Date Taking? Authorizing Provider  diltiazem (CARDIZEM CD) 240 MG 24 hr capsule Take 1 capsule (240 mg total) by mouth daily. 07/29/20 10/27/20  Tina Man, MD  hydrochlorothiazide (HYDRODIURIL) 25 MG tablet TAKE 1 TABLET BY MOUTH ONCE DAILY 03/30/18   Tina Man, MD  loratadine (CLARITIN) 10 MG tablet TAKE 1 TABLET(10 MG) BY MOUTH DAILY 07/27/20   Constant, Peggy, MD  meloxicam (MOBIC) 15 MG tablet Take 1 tablet by mouth daily. Patient not taking: No sig reported 06/30/20   [provider]  Potassium Chloride ER 20 MEQ TBCR Take 1 tablet by mouth daily. 01/12/19   [provider]  tinidazole (TINDAMAX) 500 MG tablet TAKE 2 TABLETS(1000 MG) BY MOUTH DAILY WITH BREAKFAST 10/20/20   Tina Bombard, MD  valsartan (DIOVAN) 40 MG tablet Take 1 tablet (40 mg total) by mouth daily. 07/29/20   Tina Man, MD    Family History    Family History  Problem Relation Age of Onset   Hypertension Father    Early death Father    Diabetes Mother    She indicated that her mother is alive. She indicated that her father is deceased. She indicated that her sister is alive. She indicated that her brother is alive.  Social History    Social History   Socioeconomic History   Marital status:  Single    Spouse name: Not on file   Number of children: Not on file   Years of education: Not on file   Highest education level: Not on file  Occupational History   Not on file  Tobacco Use   Smoking status: Every Day    Packs/day: 0.50    Years: 10.00    Pack years: 5.00    Types: Cigarettes    Start date: 05/29/2003   Smokeless tobacco: Never   Tobacco comments:    smoke about 4 a day  Vaping Use   Vaping Use: Never used  Substance and Sexual Activity   Alcohol use: Yes    Alcohol/week: 2.0 standard drinks    Types: 2 Standard drinks or equivalent per week    Comment: Occasionaly    Drug use: No   Sexual activity: Yes    Partners: Male    Birth control/protection: Surgical  Other  Topics Concern   Not on file  Social History Narrative   Single. Still smokes maybe a quarter of a pack a day. Does not take alcohol or caffeine to   Social Determinants of Health   Financial Resource Strain: Not on file  Food Insecurity: Not on file  Transportation Needs: Not on file  Physical Activity: Not on file  Stress: Not on file  Social Connections: Not on file  Intimate Partner Violence: Not on file     Review of Systems    General:  No chills, fever, night sweats or weight changes.  Cardiovascular:  No chest pain, dyspnea on exertion, edema, orthopnea, palpitations, paroxysmal nocturnal dyspnea. Dermatological: No rash, lesions/masses Respiratory: No cough, dyspnea Urologic: No hematuria, dysuria Abdominal:   No nausea, vomiting, diarrhea, bright red blood per rectum, melena, or hematemesis Neurologic:  No visual changes, wkns, changes in mental status. All other systems reviewed and are otherwise negative except as noted above.  Physical Exam    VS:  BP 128/72 (BP Location: Left Arm, Patient Position: Sitting, Cuff Size: Normal)   Pulse 82   Ht 5' (1.524 m)   Wt 165 lb 12.8 oz (75.2 kg)   LMP 06/09/2017   SpO2 98%   BMI 32.38 kg/m  , BMI Body mass index is 32.38  kg/m. GEN: Well nourished, well developed, in no acute distress. HEENT: normal. Neck: Supple, no JVD, carotid bruits, or masses. Cardiac: RRR, no murmurs, rubs, or gallops. No clubbing, cyanosis, edema.  Radials/DP/PT 2+ and equal bilaterally.  Respiratory:  Respirations regular and unlabored, clear to auscultation bilaterally. GI: Soft, nontender, nondistended, BS + x 4. MS: no deformity or atrophy. Skin: warm and dry, no rash. Neuro:  Strength and sensation are intact. Psych: Normal affect.  Accessory Clinical Findings    Recent Labs: 07/20/2020: ALT 20; BUN 10; Creatinine, Ser 0.64; Hemoglobin 14.5; Platelets 280; Potassium 3.8; Sodium 139; TSH 1.660   Recent Lipid Panel    Component Value Date/Time   CHOL 220 (H) 07/20/2020 0936   TRIG 92 07/20/2020 0936   HDL 60 07/20/2020 0936   LDLDIRECT 150 (H) 07/12/2019 1655    ECG personally reviewed by me today- none today.   EKG 07/12/2019 EKG today shows normal sinus rhythm 87 bpm- No acute changes   EKG 12/21/2016 Normal sinus rhythm 81 bpm   Echocardiogram 06/13/2019   IMPRESSIONS     1. Left ventricular ejection fraction, by estimation, is 60 to 65%. Left  ventricular ejection fraction by PLAX is 62 %. The left ventricle has  normal function. The left ventricle has no regional wall motion  abnormalities. Left ventricular diastolic  parameters were normal. The average left ventricular global longitudinal  strain is -18.0 %.   2. Right ventricular systolic function is normal. The right ventricular  size is normal. There is normal pulmonary artery systolic pressure.   3. The mitral valve is normal in structure. No evidence of mitral valve  regurgitation. No evidence of mitral stenosis.   4. The aortic valve is tricuspid. Aortic valve regurgitation is not  visualized. No aortic stenosis is present.   5. The inferior vena cava is normal in size with greater than 50%  respiratory variability, suggesting right atrial  pressure of 3 mmHg.  Assessment & Plan   1. Essential hypertension-BP today 128/72.  Well-controlled at home 120s over 80s. Continue HCTZ, diltiazem, valsartan Heart healthy low-sodium diet-salty 6 given Increase physical activity as tolerated  Order BMP   Palpitations-less frequent  intermittent periods of flutter/palpitation.  Has not had any palpitations in the last several weeks.  Denies presyncope and syncope.  Again reviewed/discussed other triggers or palpitations. Continue diltiazem  Heart healthy low-sodium diet Increase physical activity as tolerated. Maintain p.o. hydration. Avoid triggers caffeine, chocolate, EtOH, stress, stimulants, etc.-Information given   Aortic valve murmur-no increased DOE or activity intolerance.  Echocardiogram showed LV function 60-65%, aortic valve was tricuspid,no valve regurgitation, No aortic stenosis present. Repeat echocardiogram when clinically indicated.   Hyperlipidemia-LDL goal less than 100 Heart healthy low-sodium high-fiber diet Increase physical activity as tolerated Repeat lipid panel-direct LDL   Disposition: Follow-up with Dr. Ellyn Benson in 6 months.   Jossie Ng. Michaeljames Milnes NP-C    10/30/2020, 8:28 AM [address] Office [phone] Fax [phone]  Notice: This dictation was prepared with Dragon dictation along with smaller phrase technology. Any transcriptional errors that result from this process are unintentional and may not be corrected upon review.  I spent 12 minutes examining this patient, reviewing medications, and using patient centered shared decision making involving her cardiac care.  Prior to her visit I spent greater than 20 minutes reviewing her past medical history,  medications, and prior cardiac tests.

## 2020-10-30 ENCOUNTER — Other Ambulatory Visit: Payer: Self-pay

## 2020-10-30 ENCOUNTER — Ambulatory Visit (INDEPENDENT_AMBULATORY_CARE_PROVIDER_SITE_OTHER): Payer: BLUE CROSS/BLUE SHIELD | Admitting: General Practice

## 2020-10-30 ENCOUNTER — Encounter: Payer: Self-pay | Admitting: General Practice

## 2020-10-30 VITALS — BP 128/72 | HR 82 | Ht 60.0 in | Wt 165.8 lb

## 2020-10-30 DIAGNOSIS — R002 Palpitations: Secondary | ICD-10-CM

## 2020-10-30 DIAGNOSIS — E785 Hyperlipidemia, unspecified: Secondary | ICD-10-CM | POA: Diagnosis not present

## 2020-10-30 DIAGNOSIS — I358 Other nonrheumatic aortic valve disorders: Secondary | ICD-10-CM

## 2020-10-30 DIAGNOSIS — I10 Essential (primary) hypertension: Secondary | ICD-10-CM

## 2020-10-30 DIAGNOSIS — Z79899 Other long term (current) drug therapy: Secondary | ICD-10-CM

## 2020-10-30 LAB — LIPID PANEL
Chol/HDL Ratio: 4.1 ratio (ref 0.0–4.4)
Cholesterol, Total: 204 mg/dL — ABNORMAL HIGH (ref 100–199)
HDL: 50 mg/dL (ref >39)
LDL Chol Calc (NIH): 138 mg/dL — ABNORMAL HIGH (ref 0–99)
Triglycerides: 87 mg/dL (ref 0–149)
VLDL Cholesterol Cal: 16 mg/dL (ref 5–40)

## 2020-10-30 LAB — BASIC METABOLIC PANEL
BUN/Creatinine Ratio: 16 (ref 9–23)
BUN: 10 mg/dL (ref 6–24)
CO2: 24 mmol/L (ref 20–29)
Calcium: 8.8 mg/dL (ref 8.7–10.2)
Chloride: 104 mmol/L (ref 96–106)
Creatinine, Ser: 0.62 mg/dL (ref 0.57–1.00)
Glucose: 82 mg/dL (ref 65–99)
Potassium: 4 mmol/L (ref 3.5–5.2)
Sodium: 142 mmol/L (ref 134–144)
eGFR: 111 mL/min/{1.73_m2} (ref >59)

## 2020-10-30 LAB — HEPATIC FUNCTION PANEL
ALT: 18 IU/L (ref 0–32)
AST: 18 IU/L (ref 0–40)
Albumin: 4.2 g/dL (ref 3.8–4.8)
Alkaline Phosphatase: 58 IU/L (ref 44–121)
Bilirubin Total: 0.4 mg/dL (ref 0.0–1.2)
Bilirubin, Direct: 0.1 mg/dL (ref 0.00–0.40)
Total Protein: 6.7 g/dL (ref 6.0–8.5)

## 2020-10-30 NOTE — Patient Instructions (Signed)
Medication Instructions:  The current medical regimen is effective;  continue present plan and medications as directed. Please refer to the Current Medication list given to you today.  *If you need a refill on your cardiac medications before your next appointment, please call your pharmacy*  Lab Work: BMET, LIPID AND LFT TODAY If you have labs (blood work) drawn today and your tests are completely normal, you will receive your results only by:  [place] (if you have MyChart) OR A paper copy in the mail.  If you have any lab test that is abnormal or we need to change your treatment, we will call you to review the results. You may go to any [facility] that is convenient for you however, we do have a lab in our office that is able to assist you. You DO NOT need an appointment for our lab. The lab is open 8:00am and closes at 4:00pm. Lunch 12:45 - 1:45pm.  Special Instructions STOP USING SALT SEASONINGS  PLEASE READ AND FOLLOW SALTY 6-ATTACHED-1,800 mg daily  PLEASE INCREASE PHYSICAL ACTIVITY AS TOLERATED  Follow-Up: Your next appointment:  6 month(s) In Person with Glenetta Hew, MD   At [facility], you and your health needs are our priority.  As part of our continuing mission to provide you with exceptional heart care, we have created designated Provider Care Teams.  These Care Teams include your primary Cardiologist (physician) and Advanced Practice Providers (APPs -  Physician Assistants and Nurse Practitioners) who all work together to provide you with the care you need, when you need it.

## 2020-10-30 NOTE — Addendum Note (Signed)
Addended by: Waylan Rocher on: 10/30/2020 08:41 AM   Modules accepted: Orders

## 2020-11-02 ENCOUNTER — Other Ambulatory Visit: Payer: Self-pay

## 2020-11-02 DIAGNOSIS — Z79899 Other long term (current) drug therapy: Secondary | ICD-10-CM

## 2020-11-02 DIAGNOSIS — E785 Hyperlipidemia, unspecified: Secondary | ICD-10-CM

## 2020-11-02 DIAGNOSIS — I10 Essential (primary) hypertension: Secondary | ICD-10-CM

## 2020-11-02 MED ORDER — ATORVASTATIN CALCIUM 10 MG PO TABS: 10.0000 mg | ORAL_TABLET | Freq: Every day | ORAL | 3 refills | Status: AC

## 2020-11-09 NOTE — Progress Notes (Signed)
Surgical Instructions    Your procedure is scheduled on Tuesday, August 23rd, 2022.   Report to [facility] Main Entrance "A" at 13:00 P.M., then check in with the Admitting office.  Call this number if you have problems the morning of surgery:  [phone]   If you have any questions prior to your surgery date call [phone]: Open Monday-Friday 8am-4pm    Remember:  Do not eat after midnight the night before your surgery  You may drink clear liquids until 12:00 the day of your surgery.   Clear liquids allowed are: Water, Non-Citrus Juices (without pulp), Carbonated Beverages, Clear Tea, Black Coffee Only, and Gatorade    Take these medicines the morning of surgery with A SIP OF WATER:  atorvastatin (LIPITOR) diltiazem (CARDIZEM CD) loratadine (CLARITIN)  tinidazole (TINDAMAX)  As of today, STOP taking any Aspirin (unless otherwise instructed by your surgeon) Aleve, Naproxen, Ibuprofen, Motrin, Advil, Goody's, BC's, all herbal medications, fish oil, and all vitamins.          Do not wear jewelry or makeup Do not wear lotions, powders, perfumes, or deodorant. Do not shave 48 hours prior to surgery.   Do not bring valuables to the hospital. DO Not wear nail polish, gel polish, artificial nails, or any other type of covering on natural nails including finger and toenails. If patients have artificial nails, gel coating, etc. that need to be removed by a nail salon please have this removed prior to surgery or surgery may need to be canceled/delayed if the surgeon/ anesthesia feels like the patient is unable to be adequately monitored.             [facility] is not responsible for any belongings or valuables.  Do NOT Smoke (Tobacco/Vaping) or drink Alcohol 24 hours prior to your procedure If you use a CPAP at night, you may bring all equipment for your overnight stay.   Contacts, glasses, dentures or bridgework may not be worn into surgery, please bring cases for these  belongings   For patients admitted to the hospital, discharge time will be determined by your treatment team.   Patients discharged the day of surgery will not be allowed to drive home, and someone needs to stay with them for 24 hours.  ONLY 1 SUPPORT PERSON MAY BE PRESENT WHILE YOU ARE IN SURGERY. IF YOU ARE TO BE ADMITTED ONCE YOU ARE IN YOUR ROOM YOU WILL BE ALLOWED TWO (2) VISITORS.  Minor children may have two parents present. Special consideration for safety and communication needs will be reviewed on a case by case basis.  Special instructions:    Oral Hygiene is also important to reduce your risk of infection.  Remember - BRUSH YOUR TEETH THE MORNING OF SURGERY WITH YOUR REGULAR TOOTHPASTE   [facility]- Preparing For Surgery  Before surgery, you can play an important role. Because skin is not sterile, your skin needs to be as free of germs as possible. You can reduce the number of germs on your skin by washing with CHG (chlorahexidine gluconate) Soap before surgery.  CHG is an antiseptic cleaner which kills germs and bonds with the skin to continue killing germs even after washing.     Please do not use if you have an allergy to CHG or antibacterial soaps. If your skin becomes reddened/irritated stop using the CHG.  Do not shave (including legs and underarms) for at least 48 hours prior to first CHG shower. It is OK to shave your face.  Please follow these instructions carefully.     Shower the NIGHT BEFORE SURGERY and the MORNING OF SURGERY with CHG Soap.   If you chose to wash your hair, wash your hair first as usual with your normal shampoo. After you shampoo, rinse your hair and body thoroughly to remove the shampoo.  Then [employer] and genitals (private parts) with your normal soap and rinse thoroughly to remove soap.  After that Use CHG Soap as you would any other liquid soap. You can apply CHG directly to the skin and wash gently with a scrungie or a clean washcloth.    Apply the CHG Soap to your body ONLY FROM THE NECK DOWN.  Do not use on open wounds or open sores. Avoid contact with your eyes, ears, mouth and genitals (private parts). Wash Face and genitals (private parts)  with your normal soap.   Wash thoroughly, paying special attention to the area where your surgery will be performed.  Thoroughly rinse your body with warm water from the neck down.  DO NOT shower/wash with your normal soap after using and rinsing off the CHG Soap.  Pat yourself dry with a CLEAN TOWEL.  Wear CLEAN PAJAMAS to bed the night before surgery  Place CLEAN SHEETS on your bed the night before your surgery  DO NOT SLEEP WITH PETS.   Day of Surgery:  Take a shower with CHG soap. Wear Clean/Comfortable clothing the morning of surgery Do not apply any deodorants/lotions.   Remember to brush your teeth WITH YOUR REGULAR TOOTHPASTE.   Please read over the following fact sheets that you were given.

## 2020-11-10 ENCOUNTER — Other Ambulatory Visit: Payer: Self-pay

## 2020-11-10 ENCOUNTER — Encounter (HOSPITAL_COMMUNITY): Payer: Self-pay

## 2020-11-10 ENCOUNTER — Encounter (HOSPITAL_COMMUNITY)
Admission: RE | Admit: 2020-11-10 | Discharge: 2020-11-10 | Disposition: A | Discharge status: Home / Self Care | Payer: BLUE CROSS/BLUE SHIELD | Source: Ambulatory Visit | Attending: Obstetrics and Gynecology | Admitting: Obstetrics and Gynecology

## 2020-11-10 DIAGNOSIS — Z01812 Encounter for preprocedural laboratory examination: Secondary | ICD-10-CM | POA: Insufficient documentation

## 2020-11-10 HISTORY — DX: Cardiac murmur, unspecified: R01.1

## 2020-11-10 HISTORY — DX: Unspecified osteoarthritis, unspecified site: M19.90

## 2020-11-10 LAB — CBC
HCT: 38.9 % (ref 36.0–46.0)
Hemoglobin: 13.1 g/dL (ref 12.0–15.0)
MCH: 31.4 pg (ref 26.0–34.0)
MCHC: 33.7 g/dL (ref 30.0–36.0)
MCV: 93.3 fL (ref 80.0–100.0)
Platelets: 261 10*3/uL (ref 150–400)
RBC: 4.17 MIL/uL (ref 3.87–5.11)
RDW: 13.2 % (ref 11.5–15.5)
WBC: 6.7 10*3/uL (ref 4.0–10.5)
nRBC: 0 % (ref 0.0–0.2)

## 2020-11-10 NOTE — Progress Notes (Signed)
PCP - Nolene Ebbs, MD Cardiologist - Glenetta Hew, MD  PPM/ICD - denies Device Orders - N/A Rep Notified - N/A  Chest x-ray - N/A EKG - 07/29/2020 Stress Test - denies ECHO - 06/13/2019 Cardiac Cath - denies  Sleep Study - denies CPAP - N/A  Fasting Blood Sugar - N/A  Blood Thinner Instructions: N/A  Aspirin Instructions: Patient was instructed: As of today, STOP taking any Aspirin (unless otherwise instructed by your surgeon) Aleve, Naproxen, Ibuprofen, Motrin, Advil, Goody's, BC's, all herbal medications, fish oil, and all vitamins.  ERAS Protcol - No  COVID TEST- no - ambulatory surgery   Anesthesia review: yes; cardiac history; nausea after anesthesia  Patient denies shortness of breath, fever, cough and chest pain at PAT appointment   All instructions explained to the patient, with a verbal understanding of the material. Patient agrees to go over the instructions while at home for a better understanding. Patient also instructed to self quarantine after being tested for COVID-19. The opportunity to ask questions was provided.

## 2020-11-11 ENCOUNTER — Telehealth: Payer: Self-pay

## 2020-11-11 ENCOUNTER — Encounter (HOSPITAL_COMMUNITY): Payer: Self-pay | Admitting: Physician Assistant

## 2020-11-11 NOTE — Progress Notes (Signed)
Anesthesia Chart Review:  Follows with cardiology for history of murmur and palpitations.  Palpitations are episodic, she will have exacerbations and then periods of quiescence.  Last seen 10/30/2020.  Per note, denied any palpitations for the past several weeks.  No presyncope or syncope.  Blood pressure was well controlled.  Recommended continue current medications.  Regarding murmur, echo showed normal LV function, tricuspid aortic valve with no regurgitation or stenosis.  She was advised to follow-up in 6 months.  CMP from 10/30/2020 reviewed, WNL.  CBC from 11/10/2020 reviewed, WNL.  EKG 07/29/2020: Normal sinus rhythm.  Rate 76.  TTE 06/13/2019:  1. Left ventricular ejection fraction, by estimation, is 60 to 65%. Left  ventricular ejection fraction by PLAX is 62 %. The left ventricle has  normal function. The left ventricle has no regional wall motion  abnormalities. Left ventricular diastolic  parameters were normal. The average left ventricular global longitudinal  strain is -18.0 %.   2. Right ventricular systolic function is normal. The right ventricular  size is normal. There is normal pulmonary artery systolic pressure.   3. The mitral valve is normal in structure. No evidence of mitral valve  regurgitation. No evidence of mitral stenosis.   4. The aortic valve is tricuspid. Aortic valve regurgitation is not  visualized. No aortic stenosis is present.   5. The inferior vena cava is normal in size with greater than 50%  respiratory variability, suggesting right atrial pressure of 3 mmHg.   Comparison(s): 03/08/12 EF 55-60%.   Event monitor 03/11/2012: Sinus rhythm with rates 60s to 80s.  Occasional PVCs.  No arrhythmias.   [name] [facility] Short Stay Center/Anesthesiology Phone [phone] 11/11/2020 11:50 AM

## 2020-11-11 NOTE — Anesthesia Preprocedure Evaluation (Deleted)
Anesthesia Evaluation    Airway        Dental   Pulmonary Current Smoker and Patient abstained from smoking.,           Cardiovascular hypertension,      Neuro/Psych    GI/Hepatic   Endo/Other    Renal/GU      Musculoskeletal   Abdominal   Peds  Hematology   Anesthesia Other Findings   Reproductive/Obstetrics                             Anesthesia Physical Anesthesia Plan  ASA:   Anesthesia Plan:    Post-op Pain Management:    Induction:   PONV Risk Score and Plan:   Airway Management Planned:   Additional Equipment:   Intra-op Plan:   Post-operative Plan:   Informed Consent:   Plan Discussed with:   Anesthesia Plan Comments: (PAT note by Karoline Caldwell, PA-C: Follows with cardiology for history of murmur and palpitations.  Palpitations are episodic, she will have exacerbations and then periods of quiescence.  Last seen 10/30/2020.  Per note, denied any palpitations for the past several weeks.  No presyncope or syncope.  Blood pressure was well controlled.  Recommended continue current medications.  Regarding murmur, echo showed normal LV function, tricuspid aortic valve with no regurgitation or stenosis.  She was advised to follow-up in 6 months.  CMP from 10/30/2020 reviewed, WNL.  CBC from 11/10/2020 reviewed, WNL.  EKG 07/29/2020: Normal sinus rhythm.  Rate 76.  TTE 06/13/2019: 1. Left ventricular ejection fraction, by estimation, is 60 to 65%. Left  ventricular ejection fraction by PLAX is 62 %. The left ventricle has  normal function. The left ventricle has no regional wall motion  abnormalities. Left ventricular diastolic  parameters were normal. The average left ventricular global longitudinal  strain is -18.0 %.  2. Right ventricular systolic function is normal. The right ventricular  size is normal. There is normal pulmonary artery systolic pressure.  3. The mitral valve  is normal in structure. No evidence of mitral valve  regurgitation. No evidence of mitral stenosis.  4. The aortic valve is tricuspid. Aortic valve regurgitation is not  visualized. No aortic stenosis is present.  5. The inferior vena cava is normal in size with greater than 50%  respiratory variability, suggesting right atrial pressure of 3 mmHg.   Comparison(s): 03/08/12 EF 55-60%.   Event monitor 03/11/2012: Sinus rhythm with rates 60s to 80s.  Occasional PVCs.  No arrhythmias. )        Anesthesia Quick Evaluation

## 2020-11-11 NOTE — Telephone Encounter (Signed)
Voicemail left on nurse line requesting a call back about patient needing PA for hospital costs. Called Ivin Booty back at [phone], ext [phone]. Explained this patient is not seen in this office but I will forward to correct location. Sharon verbalizes understanding. Call routed.

## 2020-11-13 ENCOUNTER — Telehealth: Payer: Self-pay | Admitting: *Deleted

## 2020-11-13 NOTE — Telephone Encounter (Signed)
Message received from [facility] office that patient has tested positive for Covid. Has issue with insurance and will call back when ready to reschedule.  Encounter closed.

## 2020-11-17 ENCOUNTER — Encounter (HOSPITAL_COMMUNITY): Admission: RE | Payer: Self-pay | Source: Home / Self Care

## 2020-11-17 ENCOUNTER — Ambulatory Visit (HOSPITAL_COMMUNITY)
Admission: RE | Admit: 2020-11-17 | Payer: BLUE CROSS/BLUE SHIELD | Source: Home / Self Care | Admitting: Obstetrics and Gynecology

## 2020-11-17 SURGERY — EXCISION, CYST, VAGINA
Anesthesia: Choice | Laterality: Left

## 2021-09-09 ENCOUNTER — Other Ambulatory Visit: Payer: Self-pay | Admitting: Cardiology

## 2021-10-09 IMAGING — DX DG HAND COMPLETE 3+V*L*
3 series · 3 of 3 positions shown · non-contrast
Comparison: None.

CLINICAL DATA: Left hand pain and swelling after fall last night.

EXAM:
LEFT HAND - COMPLETE 3+ VIEW

[hand pa]
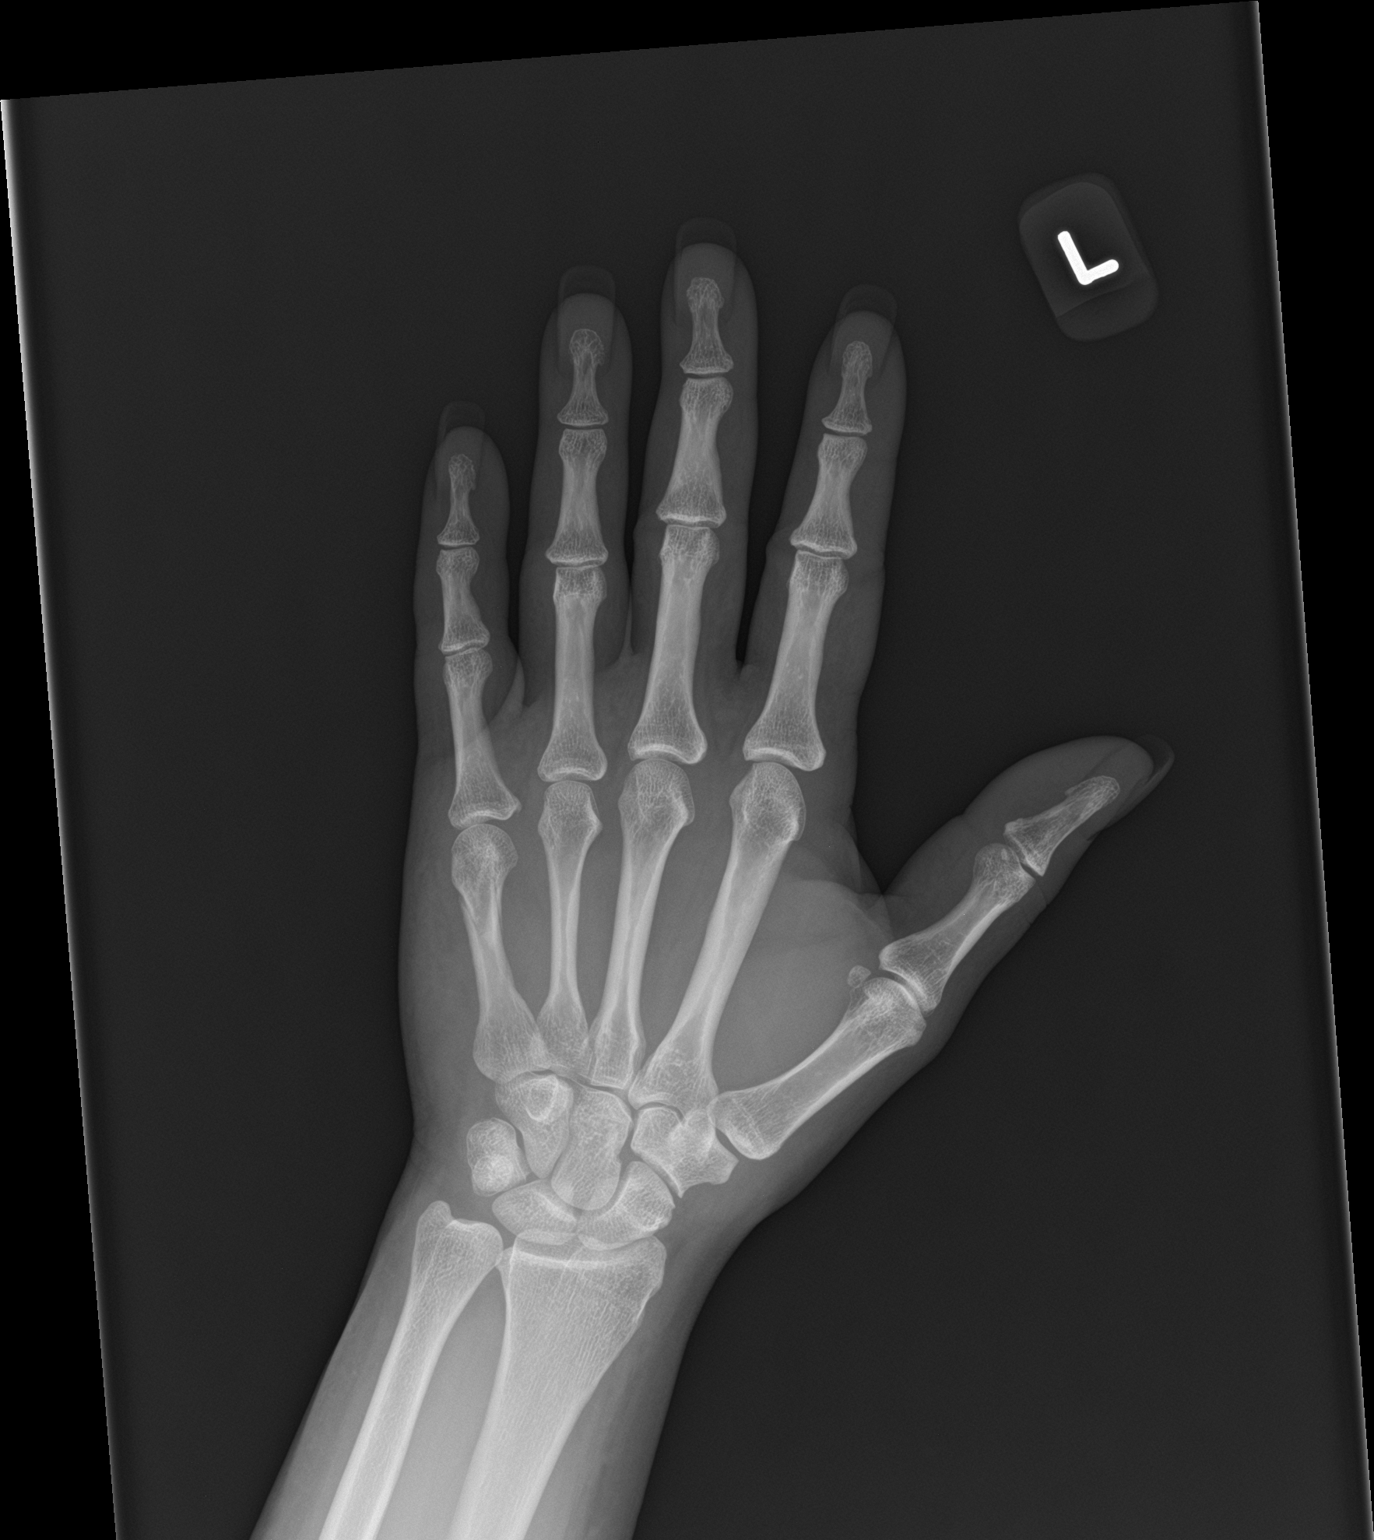

[hand obl]
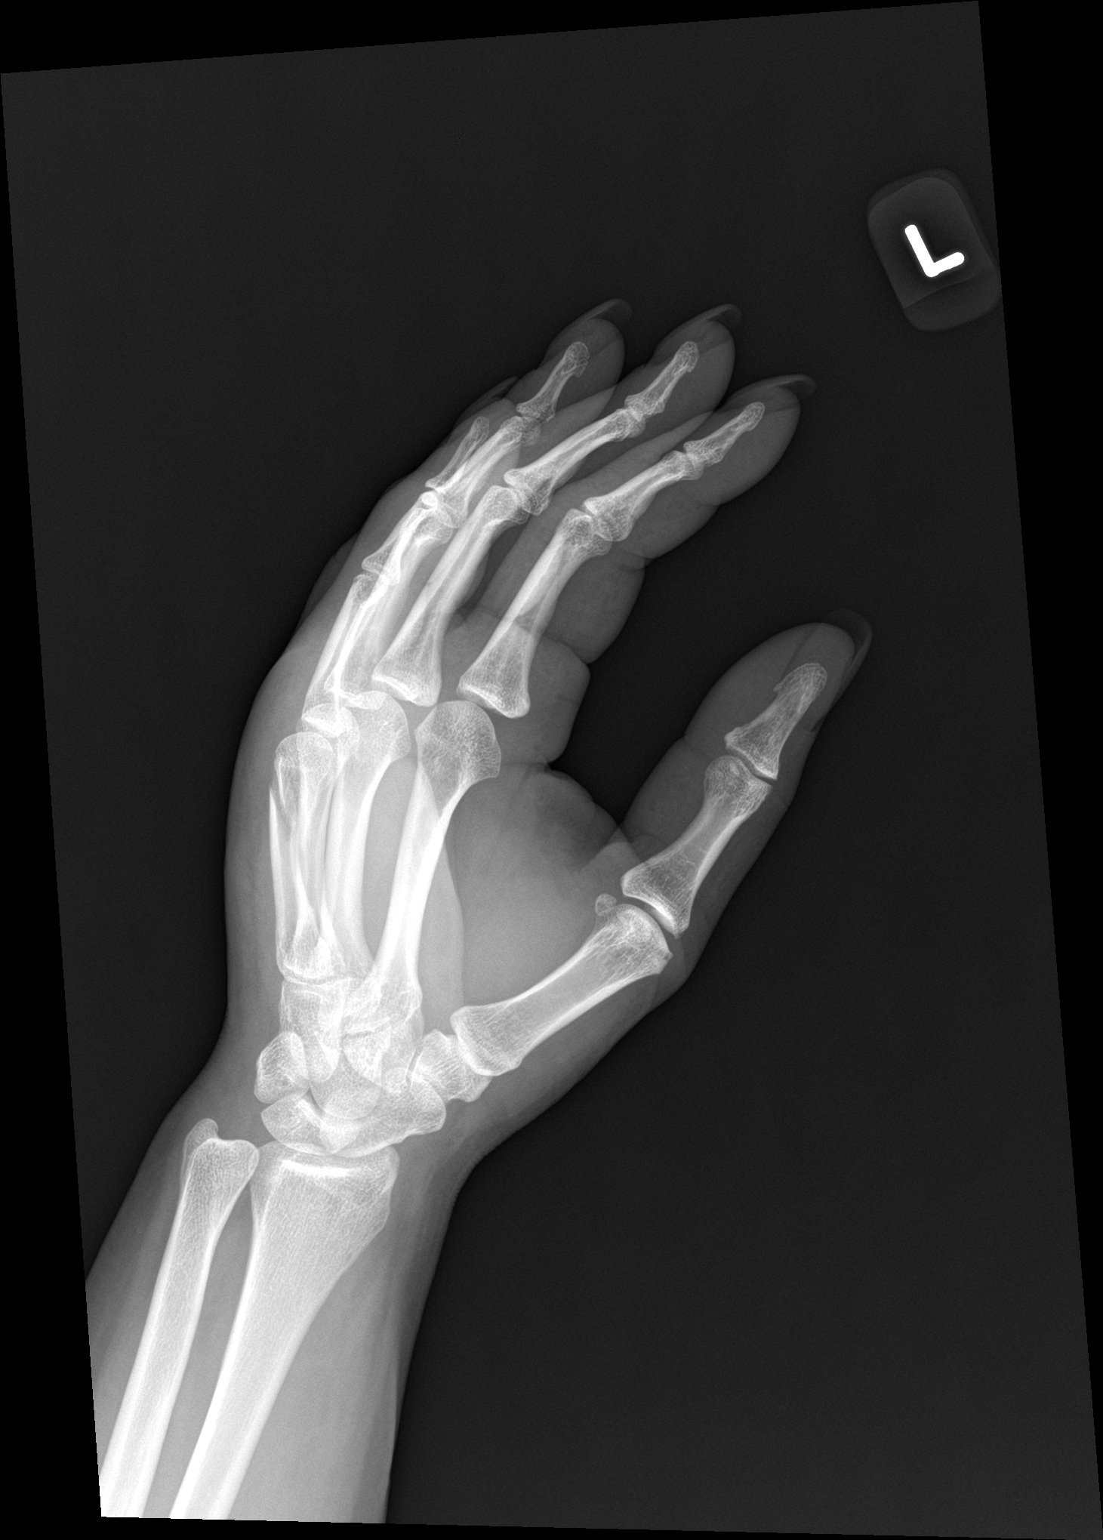

[hand lat]
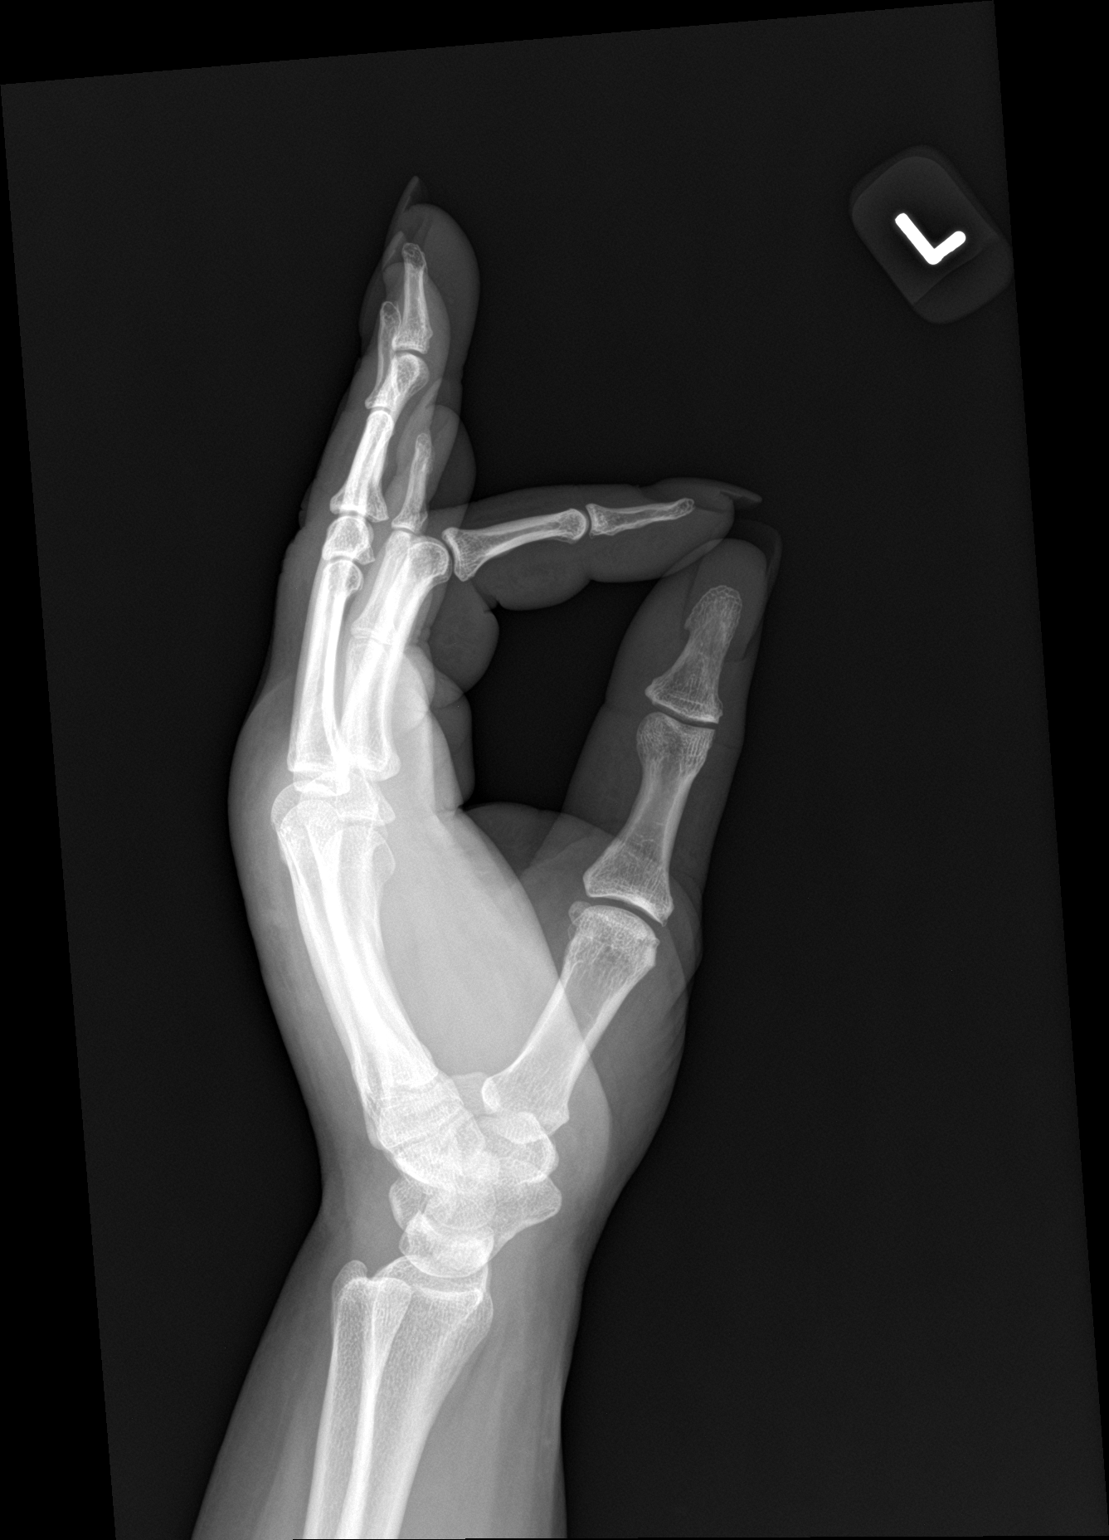

[3 of 3 positions shown; findings below may reference images not displayed]

FINDINGS: Acute oblique fracture of the distal fifth metacarpal shaft with 5
mm of overriding. No significant volar angulation. No
intra-articular extension. No additional fracture. No dislocation.
Joint spaces are preserved. Bone mineralization is normal. Soft
tissue swelling of the dorsal and ulnar hand.
IMPRESSION: 1. Acute minimally displaced fracture of the distal fifth metacarpal
shaft.

## 2022-05-20 ENCOUNTER — Other Ambulatory Visit: Payer: Self-pay | Admitting: General Practice

## 2022-09-13 ENCOUNTER — Other Ambulatory Visit: Payer: Self-pay | Admitting: General Practice

## 2022-10-28 ENCOUNTER — Other Ambulatory Visit: Payer: Self-pay | Admitting: General Practice

## 2022-10-28 ENCOUNTER — Telehealth: Payer: Self-pay | Admitting: Cardiology

## 2022-10-28 MED ORDER — DILTIAZEM HCL ER COATED BEADS 240 MG PO CP24: 240.0000 mg | ORAL_CAPSULE | Freq: Every day | ORAL | 1 refills | Status: AC

## 2022-10-28 NOTE — Telephone Encounter (Signed)
*  STAT* If patient is at the pharmacy, call can be transferred to refill team.   1. Which medications need to be refilled? (please list name of each medication and dose if known) tinidazole (TINDAMAX) 500 MG tablet  2. Which pharmacy/location (including street and city if local pharmacy) is medication to be sent to?[facility] [phone] - [place], [place] - [address]   3. Do they need a 30 day or 90 day supply? 90 Day Supply  Pt is currently out of the medication, pt is schedule for 09/06

## 2022-10-28 NOTE — Telephone Encounter (Signed)
Called pt and stated that she needed medication diltiazem refills. I sent pt's medication to pt preferred pharmacy. Confirmation received.

## 2022-11-30 NOTE — Progress Notes (Signed)
Cardiology Clinic Note   Date: 12/02/2022 ID: Tina, Benson 1975-05-20, MRN [id number]  Primary Cardiologist:  Bryan Lemma, MD  Patient Profile    [name] is a 47 y.o. female who presents to the clinic today for delayed routine follow up.     Past medical history significant for: Palpitations.  Echo 06/13/2019: EF 60 to 65%.  No RWMA.  Normal RV function.  Normal PA pressure.  No valvular abnormalities. Hypertension.  Hyperlipidemia. Tobacco abuse.      History of Present Illness    [name] is a longtime patient of cardiology.  She is followed by Dr. Herbie Baltimore for the above outlined history.  Patient was last seen in the office by Edd Fabian, NP on 10/30/2020 for follow-up after medication changes.  Patient was seen by Dr. Herbie Baltimore on 07/29/2020 and noted to be hypertensive with a BP of 158/90.  Her diltiazem was increased and she was started on valsartan.  At the time of her follow-up visit she was feeling improved and was working on increasing her physical activity.  Initial BP 142/88 on intake and 128/72 on recheck.  Today, patient is doing well. Patient denies shortness of breath or dyspnea on exertion. No chest pain, pressure, or tightness. Denies lower extremity edema, orthopnea, or PND. No palpitations. She has been working on better control of BP with PCP. She does not check it at home. She has a visit with PCP coming up in 2 weeks. Encouraged her to keep a BP log over the next 2 weeks to bring to that visit. She continues to smoke 1/2 pack a day. She works 12 hour shifts at [employer]. She does not do formal exercise. She is working on cutting back sodium and decreasing red meat and fried foods.     ROS: All other systems reviewed and are otherwise negative except as noted in History of Present Illness.  Studies Reviewed    EKG Interpretation Date/Time:  Friday December 02 2022 11:18:01 EDT Ventricular Rate:  67 PR  Interval:  192 QRS Duration:  84 QT Interval:  392 QTC Calculation: 414 R Axis:   36  Text Interpretation: Normal sinus rhythm Normal ECG When compared with ECG of 14-Dec-2010 13:53, No significant change was found Confirmed by Carlos Levering [phone]) on 12/02/2022 11:19:33 AM    Risk Assessment/Calculations      HYPERTENSION CONTROL Vitals:   12/02/22 1033 12/02/22 1221  BP: (!) 140/94 (!) 140/90    The patient's blood pressure is elevated above target today.  In order to address the patient's elevated BP: Follow up with primary care provider for management.          Physical Exam    VS:  BP (!) 140/94   Pulse 86   Ht 5' (1.524 m)   Wt 157 lb (71.2 kg)   LMP 06/03/2017   SpO2 99%   BMI 30.66 kg/m  , BMI Body mass index is 30.66 kg/m.  GEN: Well nourished, well developed, in no acute distress. Neck: No JVD or carotid bruits. Cardiac:  RRR. No murmurs. No rubs or gallops.   Respiratory:  Respirations regular and unlabored. Clear to auscultation without rales, wheezing or rhonchi. GI: Soft, nontender, nondistended. Extremities: Radials/DP/PT 2+ and equal bilaterally. No clubbing or cyanosis. No edema.  Skin: Warm and dry, no rash. Neuro: Strength intact.  Assessment & Plan    Palpitations. Patient denies any further episodes of palpitations. EKG today shows NSR. RRR on  exam.  Hypertension. BP today 140/94 on intake and 140/90 on my recheck. Patient denies headaches, dizziness or vision changes. Continue diltiazem, hydrochlorothiazide, valsartan. Will defer medication changes to PCP. Patient is encouraged to keep home BP log for the next 2 weeks before she sees PCP.  Hyperlipidemia. She stopped atorvastatin on her own. She has not had her lipids checked since 2022. She is fasting today. Will get a lipid panel today.  Tobacco abuse. Patient smokes a 1/2 pack of cigarettes a day. Discussed systematically cutting back until complete cessation. She will think about it.    Disposition: Lipid panel today. Return in 1 year or sooner as needed.          Signed, Etta Grandchild. Hubert Derstine, DNP, NP-C

## 2022-12-02 ENCOUNTER — Encounter: Payer: Self-pay | Admitting: Student

## 2022-12-02 ENCOUNTER — Ambulatory Visit: Payer: BLUE CROSS/BLUE SHIELD | Attending: Student | Admitting: Student

## 2022-12-02 VITALS — BP 140/90 | HR 86 | Ht 60.0 in | Wt 157.0 lb

## 2022-12-02 DIAGNOSIS — R002 Palpitations: Secondary | ICD-10-CM

## 2022-12-02 DIAGNOSIS — I10 Essential (primary) hypertension: Secondary | ICD-10-CM | POA: Diagnosis not present

## 2022-12-02 DIAGNOSIS — E785 Hyperlipidemia, unspecified: Secondary | ICD-10-CM

## 2022-12-02 LAB — LIPID PANEL
Chol/HDL Ratio: 2.7 ratio (ref 0.0–4.4)
Cholesterol, Total: 185 mg/dL (ref 100–199)
HDL: 68 mg/dL (ref >39)
LDL Chol Calc (NIH): 107 mg/dL — ABNORMAL HIGH (ref 0–99)
Triglycerides: 54 mg/dL (ref 0–149)
VLDL Cholesterol Cal: 10 mg/dL (ref 5–40)

## 2022-12-02 NOTE — Patient Instructions (Addendum)
Medication Instructions:  No medication changes *If you need a refill on your cardiac medications before your next appointment, please call your pharmacy*   Lab Work: Lipid Panel  If you have labs (blood work) drawn today and your tests are completely normal, you will receive your results only by: MyChart Message (if you have MyChart) OR A paper copy in the mail If you have any lab test that is abnormal or we need to change your treatment, we will call you to review the results.    Follow-Up: At [facility], you and your health needs are our priority.  As part of our continuing mission to provide you with exceptional heart care, we have created designated Provider Care Teams.  These Care Teams include your primary Cardiologist (physician) and Advanced Practice Providers (APPs -  Physician Assistants and Nurse Practitioners) who all work together to provide you with the care you need, when you need it.  We recommend signing up for the patient portal called "MyChart".  Sign up information is provided on this After Visit Summary.  MyChart is used to connect with patients for Virtual Visits (Telemedicine).  Patients are able to view lab/test results, encounter notes, upcoming appointments, etc.  Non-urgent messages can be sent to your provider as well.   To learn more about what you can do with MyChart, go to [email or web address].    Your next appointment:   1 year(s)  Provider:   Bryan Lemma, MD
